# Patient Record
Sex: Female | Born: 1990 | Race: Black or African American | Hispanic: No | Marital: Single | Smoking: Former smoker
Health system: Southern US, Community
[De-identification: ages and names within clinical notes are randomized; demographics above are authoritative.]

## PROBLEM LIST (undated history)

## (undated) ENCOUNTER — Inpatient Hospital Stay (HOSPITAL_COMMUNITY): Payer: Self-pay

## (undated) DIAGNOSIS — D649 Anemia, unspecified: Secondary | ICD-10-CM

## (undated) HISTORY — PX: OTHER SURGICAL HISTORY: SHX169

---
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
## 2011-02-11 NOTE — L&D Delivery Note (Signed)
Delivery Note At 2:04 AM a viable female was delivered via  (Presentation: ;  ).  APGAR: , ; weight .   Placenta status: , .  Cord: 3 vessels with the following complications: .  Cord pH: not done  Anesthesia: Epidural  Episiotomy:  Lacerations:  Suture Repair: 2.0 vicryl Est. Blood Loss (mL):   Mom to postpartum.  Baby to NICU.  MARSHALL,BERNARD A 01/08/2012, 2:21 AM

## 2011-09-03 ENCOUNTER — Emergency Department (HOSPITAL_COMMUNITY)
Admission: EM | Admit: 2011-09-03 | Discharge: 2011-09-03 | Discharge status: Against Medical Advice | Payer: Medicaid Other | Attending: Emergency Medicine | Admitting: Emergency Medicine

## 2011-09-03 ENCOUNTER — Encounter (HOSPITAL_COMMUNITY): Payer: Self-pay | Admitting: *Deleted

## 2011-09-03 DIAGNOSIS — Z331 Pregnant state, incidental: Secondary | ICD-10-CM | POA: Insufficient documentation

## 2011-09-03 DIAGNOSIS — R109 Unspecified abdominal pain: Secondary | ICD-10-CM | POA: Insufficient documentation

## 2011-09-03 LAB — URINALYSIS, ROUTINE W REFLEX MICROSCOPIC
Glucose, UA: NEGATIVE mg/dL
pH: 6 (ref 5.0–8.0)

## 2011-09-03 LAB — URINE MICROSCOPIC-ADD ON
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
## 2011-09-03 NOTE — ED Notes (Signed)
Could feel fetus moving under doppler while trying to find FHR
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
## 2011-09-03 NOTE — ED Notes (Signed)
Called pt for reassessment. No answer.  

## 2011-09-03 NOTE — ED Notes (Signed)
No answer x2 

## 2011-09-04 NOTE — ED Provider Notes (Signed)
The patient presented emergency department but when she was called to be evaluated she was no longer in the waiting room.  This patient was not seen or evaluated by an emergency medicine provider.  I personally did not see nor valuate this patient.  The patient left before being seen but after triage    Lyanne Co, MD 09/04/11 [phone]

## 2011-09-20 ENCOUNTER — Encounter (HOSPITAL_COMMUNITY): Payer: Self-pay | Admitting: Emergency Medicine

## 2011-09-20 ENCOUNTER — Emergency Department (HOSPITAL_COMMUNITY): Payer: Medicaid Other

## 2011-09-20 ENCOUNTER — Emergency Department (HOSPITAL_COMMUNITY)
Admission: EM | Admit: 2011-09-20 | Discharge: 2011-09-21 | Disposition: A | Discharge status: Home / Self Care | Payer: Medicaid Other | Attending: Emergency Medicine | Admitting: Emergency Medicine

## 2011-09-20 DIAGNOSIS — R1031 Right lower quadrant pain: Secondary | ICD-10-CM

## 2011-09-20 DIAGNOSIS — R109 Unspecified abdominal pain: Secondary | ICD-10-CM

## 2011-09-20 DIAGNOSIS — O2690 Pregnancy related conditions, unspecified, unspecified trimester: Secondary | ICD-10-CM | POA: Insufficient documentation

## 2011-09-20 LAB — CBC WITH DIFFERENTIAL/PLATELET
Eosinophils Absolute: 0 10*3/uL (ref 0.0–0.7)
Eosinophils Relative: 0 % (ref 0–5)
HCT: 33.9 % — ABNORMAL LOW (ref 36.0–46.0)
Hemoglobin: 11.8 g/dL — ABNORMAL LOW (ref 12.0–15.0)
Lymphocytes Relative: 11 % — ABNORMAL LOW (ref 12–46)
Lymphs Abs: 1.4 10*3/uL (ref 0.7–4.0)
MCH: 30.3 pg (ref 26.0–34.0)
MCV: 87.1 fL (ref 78.0–100.0)
Monocytes Absolute: 1 10*3/uL (ref 0.1–1.0)
Monocytes Relative: 7 % (ref 3–12)
RBC: 3.89 MIL/uL (ref 3.87–5.11)

## 2011-09-20 LAB — POCT I-STAT, CHEM 8
BUN: 8 mg/dL (ref 6–23)
Chloride: 104 mEq/L (ref 96–112)
Creatinine, Ser: 1 mg/dL (ref 0.50–1.10)
Sodium: 136 mEq/L (ref 135–145)
TCO2: 22 mmol/L (ref 0–100)

## 2011-09-20 LAB — URINALYSIS, ROUTINE W REFLEX MICROSCOPIC
Bilirubin Urine: NEGATIVE
Specific Gravity, Urine: 1.013 (ref 1.005–1.030)
Urobilinogen, UA: 0.2 mg/dL (ref 0.0–1.0)

## 2011-09-20 LAB — HEPATIC FUNCTION PANEL
ALT: 35 U/L (ref 0–35)
Alkaline Phosphatase: 79 U/L (ref 39–117)
Indirect Bilirubin: 0.4 mg/dL (ref 0.3–0.9)
Total Bilirubin: 0.5 mg/dL (ref 0.3–1.2)

## 2011-09-20 LAB — URINE MICROSCOPIC-ADD ON

## 2011-09-20 MED ORDER — DEXTROSE 5 % IV SOLN
1.0000 g | Freq: Once | INTRAVENOUS | Status: AC
  Administered 2011-09-20: 1 g via INTRAVENOUS
  Filled 2011-09-20: qty 10

## 2011-09-20 NOTE — ED Provider Notes (Signed)
History     CSN: [id number]  Arrival date & time 09/20/11  1449   First MD Initiated Contact with Patient 09/20/11 2002      Chief Complaint  Patient presents with  . Flank Pain    (Consider location/radiation/quality/duration/timing/severity/associated sxs/prior treatment) Patient is a 21 y.o. female presenting with abdominal pain. The history is provided by the patient.  Abdominal Pain The primary symptoms of the illness include abdominal pain. The primary symptoms of the illness do not include fever, shortness of breath, nausea or vomiting. The current episode started yesterday. The onset of the illness was sudden. Progression since onset: waxing and waning.  The abdominal pain is located in the RUQ. The abdominal pain does not radiate. The severity of the abdominal pain is 6/10. The abdominal pain is relieved by nothing.  The patient states that she believes she is currently pregnant (approximately 20 weeks). Additional symptoms associated with the illness include frequency. Symptoms associated with the illness do not include chills, constipation or urgency.    History reviewed. No pertinent past medical history.  History reviewed. No pertinent past surgical history.  History reviewed. No pertinent family history.  History  Substance Use Topics  . Smoking status: Never Smoker   . Smokeless tobacco: Not on file  . Alcohol Use: No    OB History    Grav Para Term Preterm Abortions TAB SAB Ect Mult Living   1               Review of Systems  Constitutional: Negative for fever and chills.  HENT: Negative for congestion and rhinorrhea.   Respiratory: Negative for shortness of breath.   Cardiovascular: Negative for chest pain and leg swelling.  Gastrointestinal: Positive for abdominal pain. Negative for nausea, vomiting and constipation.  Genitourinary: Positive for frequency. Negative for urgency, decreased urine volume and difficulty urinating.  Skin: Negative for  wound.  Neurological: Negative for headaches.  Psychiatric/Behavioral: Negative for confusion.  All other systems reviewed and are negative.    Allergies  Review of patient's allergies indicates no known allergies.  Home Medications   Current Outpatient Rx  Name Route Sig Dispense Refill  . PRENATAL MULTIVITAMIN CH Oral Take 1 tablet by mouth daily.      BP 117/59  Pulse 76  Temp 98.5 F (36.9 C) (Oral)  Resp 18  SpO2 98%  LMP 04/25/2011  Physical Exam  Nursing note and vitals reviewed. Constitutional: She is oriented to person, place, and time. She appears well-developed and well-nourished. No distress.  HENT:  Head: Normocephalic and atraumatic.  Right Ear: External ear normal.  Left Ear: External ear normal.  Nose: Nose normal.  Mouth/Throat: Oropharynx is clear and moist.  Neck: Neck supple.  Cardiovascular: Normal rate, regular rhythm, normal heart sounds and intact distal pulses.   Pulmonary/Chest: Effort normal and breath sounds normal. No respiratory distress. She has no wheezes. She has no rales.  Abdominal: Soft. She exhibits no distension. There is tenderness in the right upper quadrant. There is no rebound, no guarding and negative Murphy's sign.  Musculoskeletal: She exhibits no edema.  Lymphadenopathy:    She has no cervical adenopathy.  Neurological: She is alert and oriented to person, place, and time.  Skin: Skin is warm and dry. She is not diaphoretic. No pallor.    ED Course  Procedures (including critical care time)  Labs Reviewed  HCG, QUANTITATIVE, PREGNANCY - Abnormal; Notable for the following:    hCG, Beta Chain, Sharene Butters, S 40981 (*)  All other components within normal limits  URINALYSIS, ROUTINE W REFLEX MICROSCOPIC - Abnormal; Notable for the following:    APPearance CLOUDY (*)     Hgb urine dipstick LARGE (*)     Ketones, ur 15 (*)     Protein, ur 100 (*)     Nitrite POSITIVE (*)     Leukocytes, UA LARGE (*)     All other  components within normal limits  POCT PREGNANCY, URINE - Abnormal; Notable for the following:    Preg Test, Ur POSITIVE (*)     All other components within normal limits  POCT I-STAT, CHEM 8 - Abnormal; Notable for the following:    Calcium, Ion 1.27 (*)     All other components within normal limits  URINE MICROSCOPIC-ADD ON - Abnormal; Notable for the following:    Bacteria, UA MANY (*)     All other components within normal limits  CBC WITH DIFFERENTIAL - Abnormal; Notable for the following:    WBC 13.6 (*)     Hemoglobin 11.8 (*)     HCT 33.9 (*)     Neutrophils Relative 82 (*)     Neutro Abs 11.1 (*)     Lymphocytes Relative 11 (*)     All other components within normal limits  HEPATIC FUNCTION PANEL  URINE CULTURE   Mr Abdomen Wo Contrast  09/21/2011  *RADIOLOGY REPORT*  Clinical Data:  [redacted] weeks pregnant.  Acute onset right lower quadrant pain.  MRI ABDOMEN AND PELVIS WITHOUT CONTRAST  Technique:  Multiplanar multisequence MR imaging of the abdomen and pelvis was performed.  No intravenous contrast was administered.  Comparison:   None.  MRI ABDOMEN  Findings:  There is a moderate hydronephrosis of the right renal collecting system and right hydroureter.  There is no obstructing calculus identified.  The left kidney is normal.  There is no free fluid in the upper abdomen.  The gallbladder is normal.  The spleen, and liver, and pancreas are grossly normal.  IMPRESSION: Right hydroureter is likely physiologic and related to gravid uterus.  No obstructing calculus identified.  MRI PELVIS  Findings: Gravid uterus.  The appendix is not identified.  There is no fluid along the right pericolic gutter.  There is a mildly enlarged right ovary and just inferior to the cecal tip and adjacent to the lateral wall of the uterus.  The ovary measures approximately 1.9 x 2.0 by 4.0 cm. There is moderate amount fluid surrounding this ovary.  There is left ovary is slightly smaller measuring 1.8 by 1.2 cm in  coronal dimension.  There is no significant free fluid the pelvis.  IMPRESSION:  1.  No clear evidence of acute appendicitis.  However, appendix not identified. 2.  The right ovary is prominent with a small amount of ovarian fluid at the cecal tip. Recommend abdominal ultrasound for evaluation of the right ovary.  Findings conveyed to Dr. Marguerita Beards on  09/20/2011 at 23 55 hours.  Original Report Authenticated By: Genevive Bi, M.D.     1. Abdominal pain       MDM  RUQ abd pain since yesterday, sharp, crampy. Approx [redacted] weeks pregnant. No vomiting, anorexia or fever. Does have elevated WBC with left shift. Concern for GB vs appendicitis based on location and being pregnant. MRI abdomen unable to visualize appendix, but right ovary is prominent with some small fluid. Will get ultrasound to r/o torsion. Given rocephin for UTI. Care transferred to Dr. Dierdre Highman with ultrasound pending. If  neg and pain improved, will d/c with Abx for UTI and have her return tomorrow for abd recheck.        Pricilla Loveless, MD 09/21/11 [phone]

## 2011-09-20 NOTE — ED Notes (Signed)
Pt c/o right sided flank pain x several days; pt sts [redacted] weeks pregnant with no prenatal care; pt sts G1; pt denies vaginal bleeding of leaking of fluid; pt sts LMP was March 15th; pt denies N/V

## 2011-09-20 NOTE — ED Provider Notes (Signed)
I saw and evaluated the patient, reviewed the resident's note and I agree with the findings and plan.  She is approximately [redacted] weeks pregnant by dates.  She has not seen an obstetrician.  She complains of right-sided abdominal pain.  She denies anorexia.  She has not had nausea, vomiting, fevers, chills, cough, shortness of breath.  She denies diarrhea.  She denies urinary tract symptoms.  She has not had vaginal discharge or bleeding.  On examination.  She is in no distress.  She has tenderness, bilaterally in her lower abdomen below.  The umbilicus.  Her uterus is at the level of the umbilicus.  She has more significant tenderness in the right lower quadrant.  I'm concerned that she may have an appendicitis.  So we will perform an MRI, for evaluation.  Cheri Guppy, MD 09/20/11 2150

## 2011-09-21 ENCOUNTER — Emergency Department (HOSPITAL_COMMUNITY): Payer: Medicaid Other

## 2011-09-21 ENCOUNTER — Other Ambulatory Visit (HOSPITAL_COMMUNITY): Payer: Self-pay | Admitting: Emergency Medicine

## 2011-09-21 DIAGNOSIS — R1031 Right lower quadrant pain: Secondary | ICD-10-CM

## 2011-09-21 MED ORDER — ONDANSETRON HCL 4 MG PO TABS: 4.0000 mg | ORAL_TABLET | Freq: Four times a day (QID) | ORAL | Status: AC

## 2011-09-21 MED ORDER — MORPHINE SULFATE 4 MG/ML IJ SOLN
4.0000 mg | Freq: Once | INTRAMUSCULAR | Status: AC
  Administered 2011-09-21: 4 mg via INTRAVENOUS
  Filled 2011-09-21: qty 1

## 2011-09-21 MED ORDER — ONDANSETRON HCL 4 MG/2ML IJ SOLN
4.0000 mg | Freq: Once | INTRAMUSCULAR | Status: AC
  Administered 2011-09-21: 4 mg via INTRAVENOUS
  Filled 2011-09-21 (×2): qty 2

## 2011-09-21 NOTE — ED Notes (Signed)
Patient being transported to US

## 2011-09-21 NOTE — ED Provider Notes (Signed)
Results for orders placed during the hospital encounter of 09/20/11  HCG, QUANTITATIVE, PREGNANCY      Component Value Range   hCG, Beta Chain, Quant, S 11614 (*) <5 mIU/mL  URINALYSIS, ROUTINE W REFLEX MICROSCOPIC      Component Value Range   Color, Urine YELLOW  YELLOW   APPearance CLOUDY (*) CLEAR   Specific Gravity, Urine 1.013  1.005 - 1.030   pH 7.5  5.0 - 8.0   Glucose, UA NEGATIVE  NEGATIVE mg/dL   Hgb urine dipstick LARGE (*) NEGATIVE   Bilirubin Urine NEGATIVE  NEGATIVE   Ketones, ur 15 (*) NEGATIVE mg/dL   Protein, ur 161 (*) NEGATIVE mg/dL   Urobilinogen, UA 0.2  0.0 - 1.0 mg/dL   Nitrite POSITIVE (*) NEGATIVE   Leukocytes, UA LARGE (*) NEGATIVE  POCT PREGNANCY, URINE      Component Value Range   Preg Test, Ur POSITIVE (*) NEGATIVE  POCT I-STAT, CHEM 8      Component Value Range   Sodium 136  135 - 145 mEq/L   Potassium 3.9  3.5 - 5.1 mEq/L   Chloride 104  96 - 112 mEq/L   BUN 8  6 - 23 mg/dL   Creatinine, Ser 0.96  0.50 - 1.10 mg/dL   Glucose, Bld 77  70 - 99 mg/dL   Calcium, Ion 0.45 (*) 1.12 - 1.23 mmol/L   TCO2 22  0 - 100 mmol/L   Hemoglobin 12.2  12.0 - 15.0 g/dL   HCT 40.9  81.1 - 91.4 %  URINE MICROSCOPIC-ADD ON      Component Value Range   Squamous Epithelial / LPF RARE  RARE   WBC, UA 21-50  <3 WBC/hpf   RBC / HPF 7-10  <3 RBC/hpf   Bacteria, UA MANY (*) RARE  CBC WITH DIFFERENTIAL      Component Value Range   WBC 13.6 (*) 4.0 - 10.5 K/uL   RBC 3.89  3.87 - 5.11 MIL/uL   Hemoglobin 11.8 (*) 12.0 - 15.0 g/dL   HCT 78.2 (*) 95.6 - 21.3 %   MCV 87.1  78.0 - 100.0 fL   MCH 30.3  26.0 - 34.0 pg   MCHC 34.8  30.0 - 36.0 g/dL   RDW 08.6  57.8 - 46.9 %   Platelets 220  150 - 400 K/uL   Neutrophils Relative 82 (*) 43 - 77 %   Neutro Abs 11.1 (*) 1.7 - 7.7 K/uL   Lymphocytes Relative 11 (*) 12 - 46 %   Lymphs Abs 1.4  0.7 - 4.0 K/uL   Monocytes Relative 7  3 - 12 %   Monocytes Absolute 1.0  0.1 - 1.0 K/uL   Eosinophils Relative 0  0 - 5 %   Eosinophils Absolute 0.0  0.0 - 0.7 K/uL   Basophils Relative 0  0 - 1 %   Basophils Absolute 0.0  0.0 - 0.1 K/uL  HEPATIC FUNCTION PANEL      Component Value Range   Total Protein 7.7  6.0 - 8.3 g/dL   Albumin 3.6  3.5 - 5.2 g/dL   AST 33  0 - 37 U/L   ALT 35  0 - 35 U/L   Alkaline Phosphatase 79  39 - 117 U/L   Total Bilirubin 0.5  0.3 - 1.2 mg/dL   Bilirubin, Direct 0.1  0.0 - 0.3 mg/dL   Indirect Bilirubin 0.4  0.3 - 0.9 mg/dL   Mr Abdomen Wo Contrast  09/21/2011  *RADIOLOGY REPORT*  Clinical Data:  [redacted] weeks pregnant.  Acute onset right lower quadrant pain.  MRI ABDOMEN AND PELVIS WITHOUT CONTRAST  Technique:  Multiplanar multisequence MR imaging of the abdomen and pelvis was performed.  No intravenous contrast was administered.  Comparison:   None.  MRI ABDOMEN  Findings:  There is a moderate hydronephrosis of the right renal collecting system and right hydroureter.  There is no obstructing calculus identified.  The left kidney is normal.  There is no free fluid in the upper abdomen.  The gallbladder is normal.  The spleen, and liver, and pancreas are grossly normal.  IMPRESSION: Right hydroureter is likely physiologic and related to gravid uterus.  No obstructing calculus identified.  MRI PELVIS  Findings: Gravid uterus.  The appendix is not identified.  There is no fluid along the right pericolic gutter.  There is a mildly enlarged right ovary and just inferior to the cecal tip and adjacent to the lateral wall of the uterus.  The ovary measures approximately 1.9 x 2.0 by 4.0 cm. There is moderate amount fluid surrounding this ovary.  There is left ovary is slightly smaller measuring 1.8 by 1.2 cm in coronal dimension.  There is no significant free fluid the pelvis.  IMPRESSION:  1.  No clear evidence of acute appendicitis.  However, appendix not identified. 2.  The right ovary is prominent with a small amount of ovarian fluid at the cecal tip. Recommend abdominal ultrasound for evaluation of  the right ovary.  Findings conveyed to Dr. Marguerita Beards on  09/20/2011 at 23 55 hours.  Original Report Authenticated By: Genevive Bi, M.D.   US Ob Limited  09/21/2011  *RADIOLOGY REPORT*  Clinical Data: Pregnant, right lower quadrant pain.  LIMITED OBSTETRIC ULTRASOUND  Technique:  Transabdominal and transvaginal sonographic images.  Findings:  Number of Fetuses: 1 Heart Rate: 144 bpm Movement: Identified Presentation: Cephalic Placental Location: Anterior Previa: Not identified Amniotic Fluid (Subjective): Within normal limits  BPD: 4.7cm   20w   2d   EDC: 02/06/2012  MATERNAL FINDINGS: Cervix: Closed Uterus/Adnexae: Right ovary measures 2.8 x 2.4 x 2.2 cm, within normal sonographic appearance.  The left ovary not identified.  IMPRESSION: Intrauterine gestation with cardiac activity and movement documented.  Estimated age of 20 weeks 2 days by BPD.  Normal sonographic appearance to the right ovary.  The left ovary is not visualized.  Recommend followup with non-emergent complete OB 14+ wk US examination for fetal biometric evaluation and anatomic survey if not already performed.  Original Report Authenticated By: Waneta Martins, M.D.   US Ob Transvaginal  09/21/2011  *RADIOLOGY REPORT*  Clinical Data: Pregnant, right lower quadrant pain.  LIMITED OBSTETRIC ULTRASOUND  Technique:  Transabdominal and transvaginal sonographic images.  Findings:  Number of Fetuses: 1 Heart Rate: 144 bpm Movement: Identified Presentation: Cephalic Placental Location: Anterior Previa: Not identified Amniotic Fluid (Subjective): Within normal limits  BPD: 4.7cm   20w   2d   EDC: 02/06/2012  MATERNAL FINDINGS: Cervix: Closed Uterus/Adnexae: Right ovary measures 2.8 x 2.4 x 2.2 cm, within normal sonographic appearance.  The left ovary not identified.  IMPRESSION: Intrauterine gestation with cardiac activity and movement documented.  Estimated age of 20 weeks 2 days by BPD.  Normal sonographic appearance to the right ovary.  The  left ovary is not visualized.  Recommend followup with non-emergent complete OB 14+ wk US examination for fetal biometric evaluation and anatomic survey if not already performed.  Original Report Authenticated By:  Waneta Martins, M.D.    Tissue care assumed from Dr. Kem Boroughs - pending ultrasound to evaluate ovaries. Having right-sided abdominal pain is [redacted] weeks pregnant. MRI was completed and is not show acute appendicitis.  Reviewed the ultrasound as above and evaluated the patient at 6 AM. She is eating [employer] french fries and states she has very mild pain. On exam his gravid with right lower quadrant soft and nontender and has no significant tenderness on exam otherwise. Patient is comfortable for discharge home. She plans to followup with OB/GYN Dr. Gaynell Face is not established yet. She agrees to recheck in 24 hours either here or with her OB/GYN. Patient sent home with prescription for Zofran and agrees to take Tylenol as needed for discomfort.  Sunnie Nielsen, MD 09/21/11 [phone]

## 2011-09-21 NOTE — ED Notes (Signed)
Pt states understanding of discharge instructions 

## 2011-09-22 NOTE — ED Provider Notes (Signed)
I saw and evaluated the patient, reviewed the resident's note and I agree with the findings and plan.  Jacorie Ernsberger, MD 09/22/11 0705 

## 2011-09-23 LAB — URINE CULTURE

## 2011-09-25 NOTE — ED Notes (Signed)
I have been unable to reach this patient by phone.  A letter is being sent.

## 2011-09-25 NOTE — ED Notes (Signed)
Chart returned from EDP office . Prescribe  Cipro  500 mg po BID x 7 days for UTI need to be called to pharmacy per  Fayrene Helper PAC.

## 2011-11-25 LAB — OB RESULTS CONSOLE GC/CHLAMYDIA
Chlamydia: POSITIVE
Gonorrhea: NEGATIVE

## 2011-11-25 LAB — OB RESULTS CONSOLE RPR: RPR: NONREACTIVE

## 2011-11-25 LAB — OB RESULTS CONSOLE RUBELLA ANTIBODY, IGM: Rubella: IMMUNE

## 2011-11-25 LAB — OB RESULTS CONSOLE ANTIBODY SCREEN: Antibody Screen: NEGATIVE

## 2011-12-01 ENCOUNTER — Other Ambulatory Visit (HOSPITAL_COMMUNITY): Payer: Self-pay | Admitting: Obstetrics

## 2011-12-01 DIAGNOSIS — Z3689 Encounter for other specified antenatal screening: Secondary | ICD-10-CM

## 2011-12-03 ENCOUNTER — Ambulatory Visit (HOSPITAL_COMMUNITY)
Admission: RE | Admit: 2011-12-03 | Discharge: 2011-12-03 | Disposition: A | Discharge status: Home / Self Care | Payer: Medicaid Other | Source: Ambulatory Visit | Attending: Obstetrics | Admitting: Obstetrics

## 2011-12-03 DIAGNOSIS — O0930 Supervision of pregnancy with insufficient antenatal care, unspecified trimester: Secondary | ICD-10-CM | POA: Insufficient documentation

## 2011-12-03 DIAGNOSIS — Z3689 Encounter for other specified antenatal screening: Secondary | ICD-10-CM

## 2011-12-31 ENCOUNTER — Encounter (HOSPITAL_COMMUNITY): Payer: Self-pay

## 2011-12-31 ENCOUNTER — Emergency Department (INDEPENDENT_AMBULATORY_CARE_PROVIDER_SITE_OTHER)
Admission: EM | Admit: 2011-12-31 | Discharge: 2011-12-31 | Disposition: A | Discharge status: Home / Self Care | Payer: Medicaid Other | Source: Home / Self Care | Attending: Family Medicine | Admitting: Family Medicine

## 2011-12-31 ENCOUNTER — Encounter (HOSPITAL_COMMUNITY): Payer: Self-pay | Admitting: *Deleted

## 2011-12-31 ENCOUNTER — Inpatient Hospital Stay (HOSPITAL_COMMUNITY)
Admission: AD | Admit: 2011-12-31 | Discharge: 2012-01-10 | DRG: 775 | Disposition: A | Discharge status: Home / Self Care | Payer: Medicaid Other | Source: Ambulatory Visit | Attending: Obstetrics | Admitting: Obstetrics

## 2011-12-31 DIAGNOSIS — O4290 Premature rupture of membranes, unspecified as to length of time between rupture and onset of labor, unspecified weeks of gestation: Principal | ICD-10-CM | POA: Diagnosis present

## 2011-12-31 DIAGNOSIS — O42919 Preterm premature rupture of membranes, unspecified as to length of time between rupture and onset of labor, unspecified trimester: Secondary | ICD-10-CM

## 2011-12-31 DIAGNOSIS — B86 Scabies: Secondary | ICD-10-CM

## 2011-12-31 LAB — CBC
HCT: 34 % — ABNORMAL LOW (ref 36.0–46.0)
Hemoglobin: 11.8 g/dL — ABNORMAL LOW (ref 12.0–15.0)
MCHC: 34.7 g/dL (ref 30.0–36.0)
RBC: 3.92 MIL/uL (ref 3.87–5.11)

## 2011-12-31 LAB — TYPE AND SCREEN: ABO/RH(D): O POS

## 2011-12-31 LAB — AMNISURE RUPTURE OF MEMBRANE (ROM) NOT AT ARMC: Amnisure ROM: POSITIVE

## 2011-12-31 MED ORDER — NIFEDIPINE 10 MG PO CAPS
20.0000 mg | ORAL_CAPSULE | Freq: Once | ORAL | Status: AC
  Administered 2011-12-31: 20 mg via ORAL
  Filled 2011-12-31: qty 2

## 2011-12-31 MED ORDER — MAGNESIUM SULFATE 40 G IN LACTATED RINGERS - SIMPLE
2.0000 g/h | INTRAVENOUS | Status: DC
  Administered 2012-01-01: 2 g/h via INTRAVENOUS
  Filled 2011-12-31 (×2): qty 500

## 2011-12-31 MED ORDER — CALCIUM CARBONATE ANTACID 500 MG PO CHEW: 2.0000 | CHEWABLE_TABLET | ORAL | Status: DC | PRN

## 2011-12-31 MED ORDER — ACETAMINOPHEN 325 MG PO TABS: 650.0000 mg | ORAL_TABLET | ORAL | Status: DC | PRN

## 2011-12-31 MED ORDER — LACTATED RINGERS IV BOLUS (SEPSIS)
1000.0000 mL | Freq: Once | INTRAVENOUS | Status: AC
  Administered 2011-12-31: 1000 mL via INTRAVENOUS

## 2011-12-31 MED ORDER — LACTATED RINGERS IV SOLN
INTRAVENOUS | Status: DC
  Administered 2011-12-31 – 2012-01-07 (×12): via INTRAVENOUS

## 2011-12-31 MED ORDER — SODIUM CHLORIDE 0.9 % IV SOLN
250.0000 mg | Freq: Four times a day (QID) | INTRAVENOUS | Status: AC
  Administered 2012-01-01 – 2012-01-02 (×7): 250 mg via INTRAVENOUS
  Filled 2011-12-31 (×10): qty 250

## 2011-12-31 MED ORDER — PERMETHRIN 5 % EX CREA: TOPICAL_CREAM | CUTANEOUS | Status: DC

## 2011-12-31 MED ORDER — AMOXICILLIN 500 MG PO CAPS
500.0000 mg | ORAL_CAPSULE | Freq: Three times a day (TID) | ORAL | Status: AC
  Administered 2012-01-02 – 2012-01-07 (×15): 500 mg via ORAL
  Filled 2011-12-31 (×15): qty 1

## 2011-12-31 MED ORDER — MAGNESIUM SULFATE BOLUS VIA INFUSION
4.0000 g | Freq: Once | INTRAVENOUS | Status: AC
  Administered 2011-12-31: 4 g via INTRAVENOUS
  Filled 2011-12-31: qty 500

## 2011-12-31 MED ORDER — SODIUM CHLORIDE 0.9 % IV SOLN
2.0000 g | Freq: Four times a day (QID) | INTRAVENOUS | Status: AC
  Administered 2011-12-31 – 2012-01-02 (×8): 2 g via INTRAVENOUS
  Filled 2011-12-31 (×9): qty 2000

## 2011-12-31 MED ORDER — BETAMETHASONE SOD PHOS & ACET 6 (3-3) MG/ML IJ SUSP
12.0000 mg | INTRAMUSCULAR | Status: AC
  Administered 2011-12-31 – 2012-01-01 (×2): 12 mg via INTRAMUSCULAR
  Filled 2011-12-31 (×2): qty 2

## 2011-12-31 MED ORDER — BUTORPHANOL TARTRATE 1 MG/ML IJ SOLN: 1.0000 mg | INTRAMUSCULAR | Status: DC | PRN

## 2011-12-31 MED ORDER — ERYTHROMYCIN LACTOBIONATE 500 MG IV SOLR
500.0000 mg | Freq: Once | INTRAVENOUS | Status: AC
  Administered 2011-12-31: 500 mg via INTRAVENOUS
  Filled 2011-12-31: qty 500

## 2011-12-31 MED ORDER — PRENATAL MULTIVITAMIN CH
1.0000 | ORAL_TABLET | Freq: Every day | ORAL | Status: DC
  Administered 2012-01-01 – 2012-01-07 (×7): 1 via ORAL
  Filled 2011-12-31 (×6): qty 1

## 2011-12-31 NOTE — Progress Notes (Signed)
Pt states she fell about 3 wks ago

## 2011-12-31 NOTE — MAU Note (Signed)
Pt states she feels like her water broke.. About ago.

## 2011-12-31 NOTE — Plan of Care (Signed)
Problem: Consults Goal: Birthing Suites Patient Information Press F2 to bring up selections list Outcome: Completed/Met Date Met:  12/31/11  Pt < [redacted] weeks EGA

## 2011-12-31 NOTE — MAU Provider Note (Signed)
History     CSN: [id number]  Arrival date and time: 12/31/11 2023   First Provider Initiated Contact with Patient 12/31/11 2058      Chief Complaint  Patient presents with  . Rupture of Membranes   HPI  Cindy Armstrong is a 21 y.o. G1P0 who was at urgent care having her skin rash evaluated. Upon leaving she felt like she was leaking fluid. She thought it was just discharge, but when she looked it was clear and watery and she has continued to leak. She thinks that she may have been having contractions last night. However, upon arriving here she started noticing contractions.   Past Medical History  Diagnosis Date  . No pertinent past medical history     Past Surgical History  Procedure Date  . No past surgeries     Family History  Problem Relation Age of Onset  . Alcohol abuse Neg Hx   . Arthritis Neg Hx   . Asthma Neg Hx   . Birth defects Neg Hx   . Cancer Neg Hx   . COPD Neg Hx   . Depression Neg Hx   . Diabetes Neg Hx   . Drug abuse Neg Hx   . Early death Neg Hx   . Hearing loss Neg Hx   . Heart disease Neg Hx   . Hyperlipidemia Neg Hx   . Hypertension Neg Hx   . Kidney disease Neg Hx   . Learning disabilities Neg Hx   . Mental illness Neg Hx   . Mental retardation Neg Hx   . Miscarriages / Stillbirths Neg Hx   . Stroke Neg Hx   . Vision loss Neg Hx     History  Substance Use Topics  . Smoking status: Never Smoker   . Smokeless tobacco: Not on file  . Alcohol Use: No    Allergies: No Known Allergies  Prescriptions prior to admission  Medication Sig Dispense Refill  . permethrin (ELIMITE) 5 % cream Use as directed on bottle and repeat in 1 week.  60 g  1  . Prenatal Vit-Fe Fumarate-FA (PRENATAL MULTIVITAMIN) TABS Take 1 tablet by mouth daily.        Review of Systems  Constitutional: Negative for fever.  Eyes: Negative for blurred vision.  Gastrointestinal: Negative for nausea, vomiting, diarrhea and constipation.  Genitourinary: Negative  for dysuria, urgency and frequency.  Neurological: Negative for headaches.   Physical Exam   Blood pressure 125/67, pulse 88, temperature 97.4 F (36.3 C), temperature source Oral, resp. rate 18, height 5\' 3"  (1.6 m), weight 58.968 kg (130 lb), last menstrual period 04/25/2011.  Physical Exam  Nursing note and vitals reviewed. Constitutional: She is oriented to person, place, and time. She appears well-developed and well-nourished.  Cardiovascular: Normal rate.   Respiratory: Effort normal.  GI: Soft.  Neurological: She is alert and oriented to person, place, and time.  Skin: Skin is warm and dry.    MAU Course  Procedures  Results for orders placed during the hospital encounter of 12/31/11 (from the past 24 hour(s))  AMNISURE RUPTURE OF MEMBRANE (ROM)     Status: Normal   Collection Time   12/31/11  9:15 PM      Component Value Range   Amnisure ROM POSITIVE     2130: spoke with Dr. Gaynell Face. Orders obtained for admission  Assessment and Plan   1. Preterm premature rupture of membranes (PPROM) delivered, current hospitalization   Admit to labor and delivery  Magnesium BMZ Antibiotics Stadol PRN  Dr. Gaynell Face to assume care.   Tawnya Crook 12/31/2011, 8:59 PM
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
## 2011-12-31 NOTE — ED Notes (Signed)
Reports a rash for "some time"; nothing she does helps, itches real bad; PREGNANT

## 2012-01-01 ENCOUNTER — Encounter (HOSPITAL_COMMUNITY): Payer: Self-pay | Admitting: *Deleted

## 2012-01-01 LAB — RPR: RPR Ser Ql: NONREACTIVE

## 2012-01-01 MED ORDER — PERMETHRIN 5 % EX CREA
TOPICAL_CREAM | Freq: Every day | CUTANEOUS | Status: DC
  Administered 2012-01-01: 12:00:00 via TOPICAL
  Administered 2012-01-02: 1 via TOPICAL
  Administered 2012-01-03 – 2012-01-04 (×2): via TOPICAL
  Filled 2012-01-01 (×2): qty 60

## 2012-01-01 NOTE — H&P (Signed)
This is Dr. Francoise Ceo dictating the history and physical on  Cindy Armstrong she's a 21 year old gravida 1 at 49 weeks and 5 days her EDC is 02/14/2012 her membranes ruptured spontaneously last night at 9 PM fluid clear vertex presentation and she was contracting pelvic was not done and she has stopped contracting on magnesium sulfate 4 g loading and 2 g per hour she was also started on IV ampicillin and erythromycin is a ruptured membranes and she's received one dose of betamethasone 12.5 mg IM she is on continuous monitoring her GBS is unknown Past medical history history of scabies affecting both upper extremities and she was treated with promethazine cream at bedtim  surgical history negative Social history noncontributory System review negative Physical exam revealed a well-developed female in no distress with ruptured Mr. Membranes Breasts negative Lungs clear to P&A Heart regular rhythm no murmurs no gallops Abdomen 34 weeks size Pelvic deferred Extremities negative

## 2012-01-01 NOTE — Progress Notes (Signed)
Patient transferred to room 149, antenatal unit

## 2012-01-01 NOTE — Progress Notes (Signed)
Monitors off for skin treatment and transfer to room 149

## 2012-01-02 ENCOUNTER — Inpatient Hospital Stay (HOSPITAL_COMMUNITY): Payer: Medicaid Other

## 2012-01-02 MED ORDER — ERYTHROMYCIN BASE 250 MG PO TBEC
250.0000 mg | DELAYED_RELEASE_TABLET | Freq: Four times a day (QID) | ORAL | Status: AC
  Administered 2012-01-03 – 2012-01-07 (×20): 250 mg via ORAL
  Filled 2012-01-02 (×20): qty 1

## 2012-01-02 NOTE — Progress Notes (Signed)
Patient ID: Cindy Armstrong, female   DOB: 1990-04-09, 21 y.o.   MRN: [id number] Vital signs normal Use 1 pad in the past 24 hours Has gotten her betamethasone and is on her IV antibiotics Will get an ultrasound today

## 2012-01-03 NOTE — Progress Notes (Signed)
Patient ID: Cindy Armstrong, female   DOB: [birth year], 21 y.o.   MRN: [id number] Vital signs normal She has some occasional contractions which she does not feel ultrasound yesterday confirmed ruptured membranes AFI was less than 5 she's now off of her IV antibiotics and is on by mouth medication she got her betamethasone x2 so she is is being observed for labor

## 2012-01-04 NOTE — Progress Notes (Signed)
Patient ID: Cindy Armstrong, female   DOB: 1990-03-27, 21 y.o.   MRN: [id number] Vital signs normal Still leaking Started contracting yesterday 3-8 minutes however it is sometimes present and other times absent reactive tracing observe and

## 2012-01-05 NOTE — Progress Notes (Signed)
Ur chart review completed.  

## 2012-01-05 NOTE — Progress Notes (Signed)
Patient ID: Cindy Armstrong, female   DOB: [birth year], 21 y.o.   MRN: [id number] Vital signs normal Contractions. Still leaking Tracing reactive

## 2012-01-06 NOTE — Progress Notes (Signed)
Patient ID: Cindy Armstrong, female   DOB: Jun 06, 1990, 21 y.o.   MRN: [id number] Vital signs normal Still uses 1 pad a day Tracing reactive on daily NST not no contractions

## 2012-01-07 ENCOUNTER — Encounter (HOSPITAL_COMMUNITY): Payer: Self-pay | Admitting: Obstetrics

## 2012-01-07 ENCOUNTER — Encounter (HOSPITAL_COMMUNITY): Payer: Self-pay | Admitting: Anesthesiology

## 2012-01-07 ENCOUNTER — Inpatient Hospital Stay (HOSPITAL_COMMUNITY): Payer: Medicaid Other | Admitting: Anesthesiology

## 2012-01-07 LAB — CBC
MCH: 29.9 pg (ref 26.0–34.0)
MCHC: 34.6 g/dL (ref 30.0–36.0)
RDW: 12.9 % (ref 11.5–15.5)

## 2012-01-07 MED ORDER — OXYTOCIN BOLUS FROM INFUSION
500.0000 mL | INTRAVENOUS | Status: DC
  Administered 2012-01-08: 500 mL via INTRAVENOUS

## 2012-01-07 MED ORDER — FENTANYL 2.5 MCG/ML BUPIVACAINE 1/10 % EPIDURAL INFUSION (WH - ANES)
14.0000 mL/h | INTRAMUSCULAR | Status: DC
  Administered 2012-01-07: 14 mL/h via EPIDURAL
  Filled 2012-01-07: qty 125

## 2012-01-07 MED ORDER — SODIUM CHLORIDE 0.9 % IJ SOLN
3.0000 mL | Freq: Two times a day (BID) | INTRAMUSCULAR | Status: DC
  Administered 2012-01-07 (×2): 3 mL via INTRAVENOUS

## 2012-01-07 MED ORDER — LACTATED RINGERS IV SOLN
INTRAVENOUS | Status: DC
  Administered 2012-01-07: via INTRAVENOUS

## 2012-01-07 MED ORDER — PHENYLEPHRINE 40 MCG/ML (10ML) SYRINGE FOR IV PUSH (FOR BLOOD PRESSURE SUPPORT)
80.0000 ug | PREFILLED_SYRINGE | INTRAVENOUS | Status: DC | PRN
  Filled 2012-01-07: qty 2

## 2012-01-07 MED ORDER — DIPHENHYDRAMINE HCL 50 MG/ML IJ SOLN
12.5000 mg | INTRAMUSCULAR | Status: DC | PRN
Start: 2012-01-07 — End: 2012-01-10

## 2012-01-07 MED ORDER — FLEET ENEMA 7-19 GM/118ML RE ENEM: 1.0000 | ENEMA | Freq: Every day | RECTAL | Status: DC | PRN

## 2012-01-07 MED ORDER — EPHEDRINE 5 MG/ML INJ
10.0000 mg | INTRAVENOUS | Status: DC | PRN
  Filled 2012-01-07: qty 4
  Filled 2012-01-07: qty 2

## 2012-01-07 MED ORDER — LACTATED RINGERS IV SOLN
500.0000 mL | Freq: Once | INTRAVENOUS | Status: AC
  Administered 2012-01-07: 500 mL via INTRAVENOUS

## 2012-01-07 MED ORDER — LACTATED RINGERS IV SOLN: 500.0000 mL | INTRAVENOUS | Status: DC | PRN

## 2012-01-07 MED ORDER — OXYCODONE-ACETAMINOPHEN 5-325 MG PO TABS
1.0000 | ORAL_TABLET | ORAL | Status: DC | PRN
  Administered 2012-01-08: 1 via ORAL
  Filled 2012-01-07: qty 1

## 2012-01-07 MED ORDER — PHENYLEPHRINE 40 MCG/ML (10ML) SYRINGE FOR IV PUSH (FOR BLOOD PRESSURE SUPPORT)
80.0000 ug | PREFILLED_SYRINGE | INTRAVENOUS | Status: DC | PRN
  Filled 2012-01-07: qty 5
  Filled 2012-01-07: qty 2

## 2012-01-07 MED ORDER — LIDOCAINE HCL (PF) 1 % IJ SOLN
INTRAMUSCULAR | Status: DC | PRN
  Administered 2012-01-07 (×2): 5 mL

## 2012-01-07 MED ORDER — LIDOCAINE HCL (PF) 1 % IJ SOLN
30.0000 mL | INTRAMUSCULAR | Status: DC | PRN
  Administered 2012-01-08: 30 mL via SUBCUTANEOUS
  Filled 2012-01-07: qty 30

## 2012-01-07 MED ORDER — ACETAMINOPHEN 325 MG PO TABS: 650.0000 mg | ORAL_TABLET | ORAL | Status: DC | PRN

## 2012-01-07 MED ORDER — IBUPROFEN 600 MG PO TABS
600.0000 mg | ORAL_TABLET | Freq: Four times a day (QID) | ORAL | Status: DC | PRN
  Filled 2012-01-07 (×5): qty 1

## 2012-01-07 MED ORDER — SODIUM CHLORIDE 0.9 % IJ SOLN: 3.0000 mL | INTRAMUSCULAR | Status: DC | PRN

## 2012-01-07 MED ORDER — ONDANSETRON HCL 4 MG/2ML IJ SOLN: 4.0000 mg | Freq: Four times a day (QID) | INTRAMUSCULAR | Status: DC | PRN

## 2012-01-07 MED ORDER — OXYTOCIN 40 UNITS IN LACTATED RINGERS INFUSION - SIMPLE MED
62.5000 mL/h | INTRAVENOUS | Status: DC
  Filled 2012-01-07: qty 1000

## 2012-01-07 MED ORDER — CITRIC ACID-SODIUM CITRATE 334-500 MG/5ML PO SOLN: 30.0000 mL | ORAL | Status: DC | PRN

## 2012-01-07 MED ORDER — EPHEDRINE 5 MG/ML INJ
10.0000 mg | INTRAVENOUS | Status: DC | PRN
  Filled 2012-01-07: qty 2

## 2012-01-07 NOTE — Progress Notes (Signed)
Patient ID: Cindy Armstrong, female   DOB: 1990-05-23, 21 y.o.   MRN: [id number] Vital signs normal Doing much more leakage and she was last week Contractions have stopped No complaints

## 2012-01-07 NOTE — Anesthesia Procedure Notes (Signed)
Epidural Patient location during procedure: OB Start time: 01/07/2012 11:12 PM  Staffing Anesthesiologist: Brayton Caves R Performed by: anesthesiologist   Preanesthetic Checklist Completed: patient identified, site marked, surgical consent, pre-op evaluation, timeout performed, IV checked, risks and benefits discussed and monitors and equipment checked  Epidural Patient position: sitting Prep: site prepped and draped and DuraPrep Patient monitoring: continuous pulse ox and blood pressure Approach: midline Injection technique: LOR air and LOR saline  Needle:  Needle type: Tuohy  Needle gauge: 17 G Needle length: 9 cm and 9 Needle insertion depth: 4 cm Catheter type: closed end flexible Catheter size: 19 Gauge Catheter at skin depth: 10 cm Test dose: negative  Assessment Events: blood not aspirated, injection not painful, no injection resistance, negative IV test and no paresthesia  Additional Notes Patient identified.  Risk benefits discussed including failed block, incomplete pain control, headache, nerve damage, paralysis, blood pressure changes, nausea, vomiting, reactions to medication both toxic or allergic, and postpartum back pain.  Patient expressed understanding and wished to proceed.  All questions were answered.  Sterile technique used throughout procedure and epidural site dressed with sterile barrier dressing. No paresthesia or other complications noted.The patient did not experience any signs of intravascular injection such as tinnitus or metallic taste in mouth nor signs of intrathecal spread such as rapid motor block. Please see nursing notes for vital signs.

## 2012-01-07 NOTE — Anesthesia Preprocedure Evaluation (Signed)

## 2012-01-08 ENCOUNTER — Encounter (HOSPITAL_COMMUNITY): Payer: Self-pay | Admitting: Obstetrics

## 2012-01-08 MED ORDER — LANOLIN HYDROUS EX OINT: TOPICAL_OINTMENT | CUTANEOUS | Status: DC | PRN

## 2012-01-08 MED ORDER — SENNOSIDES-DOCUSATE SODIUM 8.6-50 MG PO TABS
2.0000 | ORAL_TABLET | Freq: Every day | ORAL | Status: DC
  Administered 2012-01-08 – 2012-01-09 (×2): 2 via ORAL

## 2012-01-08 MED ORDER — DIPHENHYDRAMINE HCL 25 MG PO CAPS: 25.0000 mg | ORAL_CAPSULE | Freq: Four times a day (QID) | ORAL | Status: DC | PRN

## 2012-01-08 MED ORDER — ZOLPIDEM TARTRATE 5 MG PO TABS: 5.0000 mg | ORAL_TABLET | Freq: Every evening | ORAL | Status: DC | PRN

## 2012-01-08 MED ORDER — PRENATAL MULTIVITAMIN CH
1.0000 | ORAL_TABLET | Freq: Every day | ORAL | Status: DC
  Administered 2012-01-08 – 2012-01-09 (×2): 1 via ORAL
  Filled 2012-01-08 (×2): qty 1

## 2012-01-08 MED ORDER — BENZOCAINE-MENTHOL 20-0.5 % EX AERO
1.0000 "application " | INHALATION_SPRAY | CUTANEOUS | Status: DC | PRN
  Filled 2012-01-08: qty 56

## 2012-01-08 MED ORDER — TETANUS-DIPHTH-ACELL PERTUSSIS 5-2.5-18.5 LF-MCG/0.5 IM SUSP
0.5000 mL | Freq: Once | INTRAMUSCULAR | Status: AC
  Administered 2012-01-09: 0.5 mL via INTRAMUSCULAR
  Filled 2012-01-08: qty 0.5

## 2012-01-08 MED ORDER — ONDANSETRON HCL 4 MG/2ML IJ SOLN: 4.0000 mg | INTRAMUSCULAR | Status: DC | PRN

## 2012-01-08 MED ORDER — ONDANSETRON HCL 4 MG PO TABS: 4.0000 mg | ORAL_TABLET | ORAL | Status: DC | PRN

## 2012-01-08 MED ORDER — OXYCODONE-ACETAMINOPHEN 5-325 MG PO TABS
1.0000 | ORAL_TABLET | ORAL | Status: DC | PRN
  Administered 2012-01-09 (×2): 1 via ORAL
  Filled 2012-01-08 (×2): qty 1

## 2012-01-08 MED ORDER — IBUPROFEN 600 MG PO TABS
600.0000 mg | ORAL_TABLET | Freq: Four times a day (QID) | ORAL | Status: DC
  Administered 2012-01-08 – 2012-01-09 (×6): 600 mg via ORAL
  Filled 2012-01-08: qty 1

## 2012-01-08 MED ORDER — FERROUS SULFATE 325 (65 FE) MG PO TABS
325.0000 mg | ORAL_TABLET | Freq: Two times a day (BID) | ORAL | Status: DC
  Administered 2012-01-08 – 2012-01-09 (×3): 325 mg via ORAL
  Filled 2012-01-08 (×3): qty 1

## 2012-01-08 MED ORDER — SIMETHICONE 80 MG PO CHEW: 80.0000 mg | CHEWABLE_TABLET | ORAL | Status: DC | PRN

## 2012-01-08 MED ORDER — DIBUCAINE 1 % RE OINT: 1.0000 "application " | TOPICAL_OINTMENT | RECTAL | Status: DC | PRN

## 2012-01-08 MED ORDER — LACTATED RINGERS IV SOLN
INTRAVENOUS | Status: DC
  Administered 2012-01-08: via INTRAUTERINE

## 2012-01-08 MED ORDER — WITCH HAZEL-GLYCERIN EX PADS: 1.0000 "application " | MEDICATED_PAD | CUTANEOUS | Status: DC | PRN

## 2012-01-08 NOTE — Progress Notes (Signed)
Patient ID: Cindy Armstrong, female   DOB: 1990-03-09, 21 y.o.   MRN: [id number] Patient [redacted] weeks pregnant with ruptured membranes since November 20 she went into labor tonight cervix is   7-8 cm 100% effaced vertex -1 station she is having some variables from time to time so an IUPC was just inserted and amnioinfusion began 325 cc of Ringer's lactate loading and 125 cc an hour she has received betamethasone times stool and has been on a course of   ampicillin and erythromycin

## 2012-01-08 NOTE — Anesthesia Postprocedure Evaluation (Signed)
  Anesthesia Post-op Note  Patient: Cindy Armstrong  Procedure(s) Performed: * No procedures listed *  Patient Location: PACU and Mother/Baby  Anesthesia Type:Epidural  Level of Consciousness: awake, alert  and patient cooperative  Airway and Oxygen Therapy: Patient Spontanous Breathing  Post-op Pain: none  Post-op Assessment: Post-op Vital signs reviewed, Adequate PO intake, No headache, No backache, No residual numbness and No residual motor weakness  Post-op Vital Signs: Reviewed and stable  Complications: No apparent anesthesia complications

## 2012-01-08 NOTE — Progress Notes (Signed)
Visited with patient while rounding on the unit.  She was in good spirits and was grateful that her baby is doing well.  Although she was not expecting for her baby to be in the NICU and it is emotional for her, she seems to be coping well with the situation.  We will continue to follow up with her when we see her in the NICU, but please page as needs arise.  Cindy Armstrong 12:26 PM   01/08/12 1200  Clinical Encounter Type  Visited With Patient and family together  Visit Type Initial

## 2012-01-09 LAB — CBC
HCT: 32.7 % — ABNORMAL LOW (ref 36.0–46.0)
Hemoglobin: 11.1 g/dL — ABNORMAL LOW (ref 12.0–15.0)
MCV: 86.7 fL (ref 78.0–100.0)
RBC: 3.77 MIL/uL — ABNORMAL LOW (ref 3.87–5.11)
WBC: 21.2 10*3/uL — ABNORMAL HIGH (ref 4.0–10.5)

## 2012-01-09 MED ORDER — IBUPROFEN 600 MG PO TABS
600.0000 mg | ORAL_TABLET | Freq: Four times a day (QID) | ORAL | Status: DC
  Administered 2012-01-09 – 2012-01-10 (×3): 600 mg via ORAL
  Filled 2012-01-09 (×3): qty 1

## 2012-01-09 NOTE — Progress Notes (Signed)
Post Partum Day 1 Subjective: no complaints  Objective: Blood pressure 137/66, pulse 82, temperature 97.9 F (36.6 C), temperature source Oral, resp. rate 18, height 5\' 3"  (1.6 m), weight 130 lb (58.968 kg), last menstrual period 04/25/2011, SpO2 100.00%, unknown if currently breastfeeding.  Physical Exam:  General: alert and no distress Lochia: appropriate Uterine Fundus: firm Incision: none DVT Evaluation: No evidence of DVT seen on physical exam.   Basename 01/09/12 0520 01/07/12 2205  HGB 11.1* 11.6*  HCT 32.7* 33.5*    Assessment/Plan: Plan for discharge tomorrow   LOS: 9 days   Ronna Herskowitz A 01/09/2012, 11:40 AM

## 2012-01-09 NOTE — Clinical Social Work Maternal (Signed)
    Clinical Social Work Department PSYCHOSOCIAL ASSESSMENT - MATERNAL/CHILD 01/09/2012  Patient:  CAMILA, MAITA  Account Number:  [id number]  Admit Date:  12/31/2011  Marjo Bicker Name:   Teresita Madura    Clinical Social Worker:  Andy Gauss   Date/Time:  01/09/2012 11:34 AM  Date Referred:  01/09/2012   Referral source  NICU     Referred reason  NICU  [facility]   Other referral source:    I:  FAMILY / HOME ENVIRONMENT Child's legal guardian:  PARENT  Guardian - Name Guardian - Age Guardian - Address  Marybel Alcott [address] [place], [place] [address]  Loetta Rough 21    Other household support members/support persons Name Relationship DOB  Romie Levee MOTHER   Pola Corn FATHER    Other support:    II  PSYCHOSOCIAL DATA Information Source:  Patient Interview  Event organiser Employment:   Financial resources:  [employer] If Medicaid - County:  [place] Other  [facility]   School / Grade:   Maternity Care Coordinator / Child Services Coordination / Early Interventions:  Cultural issues impacting care:    III  STRENGTHS Strengths  Adequate Resources  Home prepared for Child (including basic supplies)  Supportive family/friends   Strength comment:    IV  RISK FACTORS AND CURRENT PROBLEMS Current Problem:  YES   Risk Factor & Current Problem Patient Issue Family Issue Risk Factor / Current Problem Comment  Other - See comment [facility]    V  SOCIAL WORK ASSESSMENT Sw met with pt to inquire about reason for [facility] @ 30 weeks and offer resources as needed.  Pt told Sw that she was aware of pregnancy but had to wait until Medicaid was approved before she could start [facility].  She denies any illegal substance use and verbalized understanding of hospital drug testing policy.  As per chart review, drug screens were not ordered.  Pt lives with her parents, who she describes as her primary support system.  FOB was at the bedside  and appears to be supportive as well.  She reports that she has all the necessary supplies for the infant.  Pt told Sw that she was happy about delivery but a little sad that baby would not discharge home with her.  She denies history of depression.  Pt has reliable transportation and plans to visit regularly.  She has not selected a pediatrician at this time.  Sw will continue to follow and assist as needed until discharge.      VI SOCIAL WORK PLAN Social Work Plan  Psychosocial Support/Ongoing Assessment of Needs  No Further Intervention Required / No Barriers to Discharge   Type of pt/family education:   If child protective services report - county:   If child protective services report - date:   Information/referral to community resources comment:   Other social work plan:

## 2012-01-10 MED ORDER — IBUPROFEN 600 MG PO TABS: 600.0000 mg | ORAL_TABLET | Freq: Four times a day (QID) | ORAL | Status: DC | PRN

## 2012-01-10 MED ORDER — OXYCODONE-ACETAMINOPHEN 5-325 MG PO TABS: 1.0000 | ORAL_TABLET | ORAL | Status: DC | PRN

## 2012-01-10 NOTE — Discharge Summary (Signed)
Obstetric Discharge Summary Reason for Admission: rupture of membranes Prenatal Procedures: ultrasound Intrapartum Procedures: spontaneous vaginal delivery Postpartum Procedures: none Complications-Operative and Postpartum: none Hemoglobin  Date Value Range Status  01/09/2012 11.1* 12.0 - 15.0 g/dL Final     HCT  Date Value Range Status  01/09/2012 32.7* 36.0 - 46.0 % Final    Physical Exam:  General: alert and no distress Lochia: appropriate Uterine Fundus: firm Incision: none DVT Evaluation: No evidence of DVT seen on physical exam.  Discharge Diagnoses: Premature labor and PROM x>24 hours  Discharge Information: Date: 01/10/2012 Activity: pelvic rest Diet: routine Medications: PNV, Ibuprofen, Colace and Percocet Condition: stable Instructions: refer to practice specific booklet Discharge to: home Follow-up Information    Follow up with Antionette Char A, MD. Schedule an appointment as soon as possible for a visit in 6 weeks.   Contact information:   [address] [place] [address] [phone]          Newborn Data: Live born female  Birth Weight: 4 lb 8.7 oz (2060 g) APGAR: 7, 9    HARPER,CHARLES A 01/10/2012, 8:44 AM

## 2012-01-10 NOTE — Progress Notes (Signed)
Discharge instructions reviewed with patient.  Patient states understanding of home care and signs/symptoms to report to MD.  No home equipment needed.  Ambulated to NICU with staff and will discharge home with significant other.

## 2012-01-10 NOTE — Progress Notes (Signed)
Post Partum Day 2 Subjective: no complaints  Objective: Blood pressure 120/57, pulse 75, temperature 98.4 F (36.9 C), temperature source Oral, resp. rate 19, height 5\' 3"  (1.6 m), weight 130 lb (58.968 kg), last menstrual period 04/25/2011, SpO2 100.00%, unknown if currently breastfeeding.  Physical Exam:  General: alert and no distress Lochia: appropriate Uterine Fundus: firm Incision: none DVT Evaluation: No evidence of DVT seen on physical exam.   Basename 01/09/12 0520 01/07/12 2205  HGB 11.1* 11.6*  HCT 32.7* 33.5*    Assessment/Plan: Discharge home   LOS: 10 days   HARPER,CHARLES A 01/10/2012, 8:39 AM

## 2012-02-21 ENCOUNTER — Emergency Department (HOSPITAL_COMMUNITY)
Admission: EM | Admit: 2012-02-21 | Discharge: 2012-02-21 | Disposition: A | Discharge status: Home / Self Care | Payer: Medicaid Other | Attending: Emergency Medicine | Admitting: Emergency Medicine

## 2012-02-21 ENCOUNTER — Encounter (HOSPITAL_COMMUNITY): Payer: Self-pay | Admitting: Emergency Medicine

## 2012-02-21 ENCOUNTER — Emergency Department (HOSPITAL_COMMUNITY): Payer: Medicaid Other

## 2012-02-21 DIAGNOSIS — Y92410 Unspecified street and highway as the place of occurrence of the external cause: Secondary | ICD-10-CM | POA: Insufficient documentation

## 2012-02-21 DIAGNOSIS — S0993XA Unspecified injury of face, initial encounter: Secondary | ICD-10-CM | POA: Insufficient documentation

## 2012-02-21 DIAGNOSIS — Y9389 Activity, other specified: Secondary | ICD-10-CM | POA: Insufficient documentation

## 2012-02-21 DIAGNOSIS — S298XXA Other specified injuries of thorax, initial encounter: Secondary | ICD-10-CM | POA: Insufficient documentation

## 2012-02-21 MED ORDER — HYDROCODONE-ACETAMINOPHEN 5-325 MG PO TABS: 1.0000 | ORAL_TABLET | Freq: Four times a day (QID) | ORAL | Status: DC | PRN

## 2012-02-21 MED ORDER — DIAZEPAM 5 MG PO TABS: 5.0000 mg | ORAL_TABLET | Freq: Two times a day (BID) | ORAL | Status: DC

## 2012-02-21 MED ORDER — NAPROXEN 500 MG PO TABS: 500.0000 mg | ORAL_TABLET | Freq: Two times a day (BID) | ORAL | Status: DC
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
## 2012-02-23 NOTE — ED Provider Notes (Signed)
Medical screening examination/treatment/procedure(s) were performed by non-physician practitioner and as supervising physician I was immediately available for consultation/collaboration.  Rosevelt Luu, MD 02/23/12 2105 

## 2012-03-26 LAB — PROCEDURE REPORT - SCANNED: Pap: NEGATIVE

## 2012-12-27 ENCOUNTER — Emergency Department (HOSPITAL_COMMUNITY)
Admission: EM | Admit: 2012-12-27 | Discharge: 2012-12-27 | Discharge status: Absent without leave | Payer: Medicaid Other | Source: Home / Self Care

## 2013-02-10 NOTE — L&D Delivery Note (Signed)
Attestation of Attending Supervision of Advanced Practitioner (CNM/NP): Evaluation and management procedures were performed by the Advanced Practitioner under my supervision and collaboration.  I have reviewed the Advanced Practitioner's note and chart, and I agree with the management and plan.  Dakwan Pridgen 11/14/2013 7:11 AM

## 2013-02-10 NOTE — L&D Delivery Note (Signed)
Delivery Note At 3:01 AM a viable female was delivered via Vaginal, Spontaneous Delivery (Presentation: ; Occiput Anterior).  APGAR: see NICU note , ; weight pending.   Placenta status: Intact, Spontaneous.  Cord:  3v with the following complications: none.  Cord pH:   Anesthesia: None  Episiotomy: None Lacerations: None Suture Repair: none Est. Blood Loss (mL): 100   [id number] @[redacted]w[redacted]d  admitted to antepartum 1 week ago for PPROM with planned IOL at 8 am today started contracting and making cervical change.  She was admitted to [facility], rapidly progressed to 10 cm, and pushed to deliver over intact perineum.  NICU team present for delivery. Infant placed in mother's arms at delivery, cord clamped by MD, cut by FOB, and baby taken to warmer for evaluation.  Infant brought back to mother for bonding before transfer to NICU r/t gestational age.   Mom to postpartum.  Baby to NICU for [redacted]w[redacted]d  Tawanna Sat 11/14/2013, 3:11 AM  I was present for this delivery and agree with the above resident's note.  Fatima Blank Certified Nurse-Midwife

## 2013-05-07 IMAGING — US US OB TRANSVAGINAL
1 series · 14 of 22 positions shown · non-contrast
Comparison: none

CLINICAL DATA: Pregnant, right lower quadrant pain.

LIMITED OBSTETRIC ULTRASOUND
TECHNIQUE: Transabdominal and transvaginal sonographic images.

[Series 1: us ob transvaginal · 0.25mm/px · 22 acquisitions, 14 frames shown]
[im 1/22]
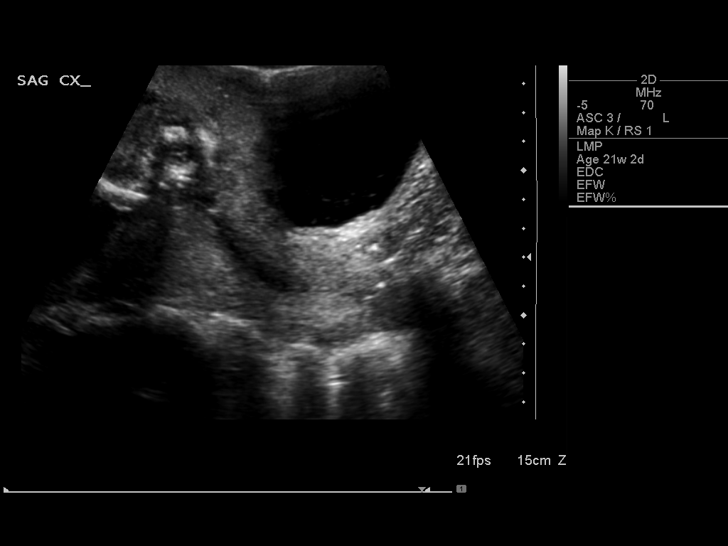
[im 3/22]
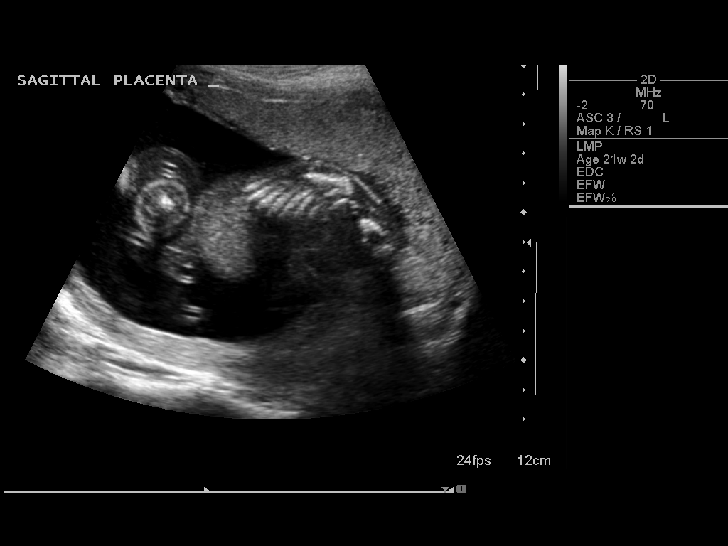
[im 4/22]
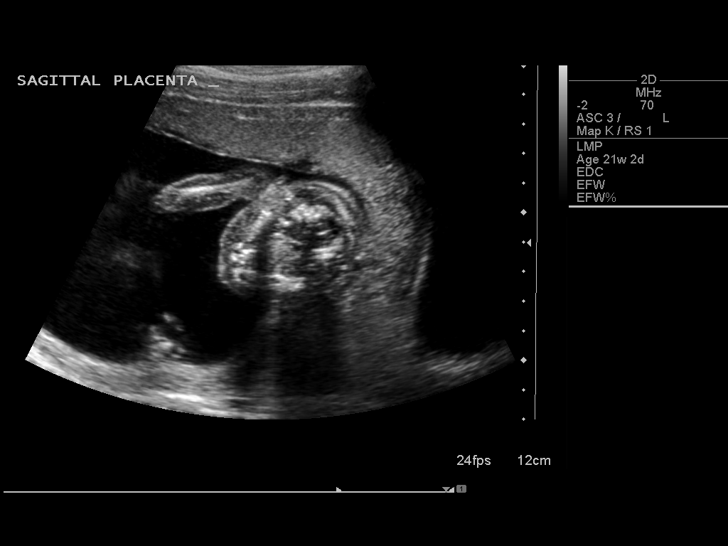
[im 6/22]
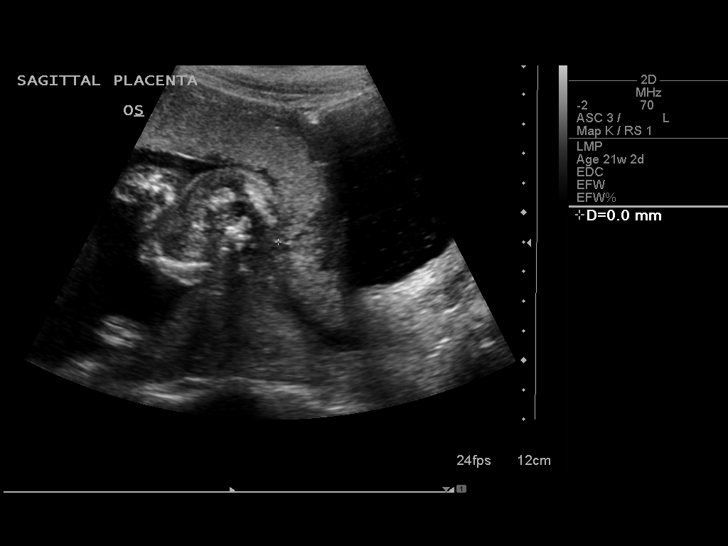
[im 8/22]
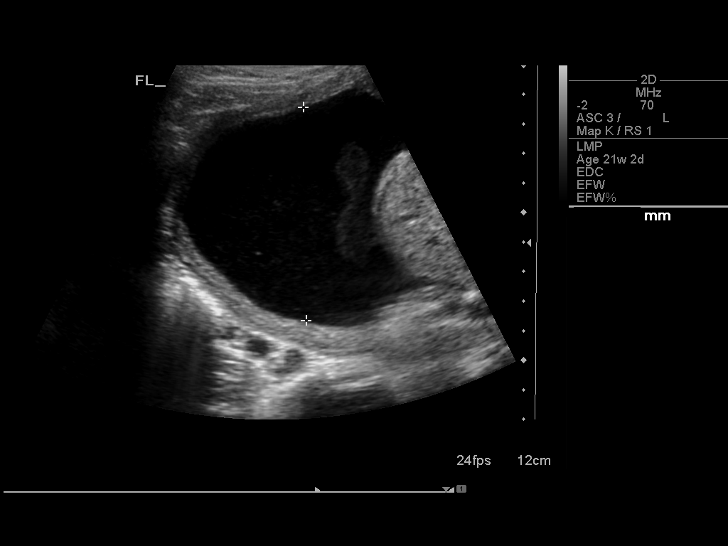
[im 9/22]
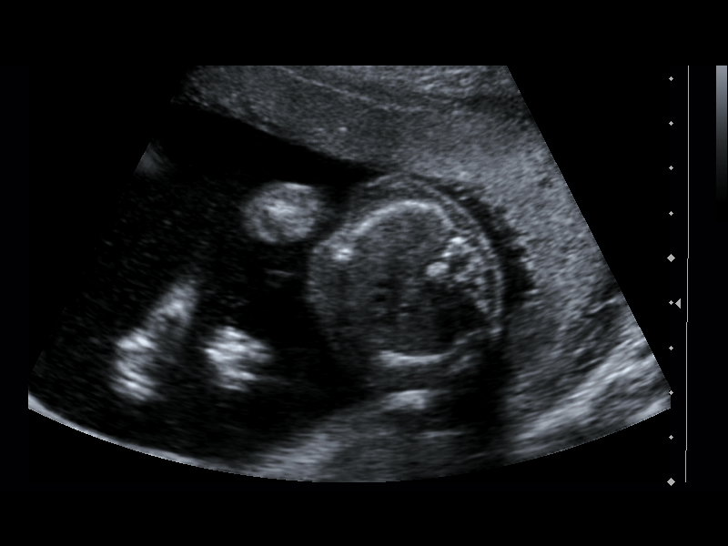
[im 11/22]
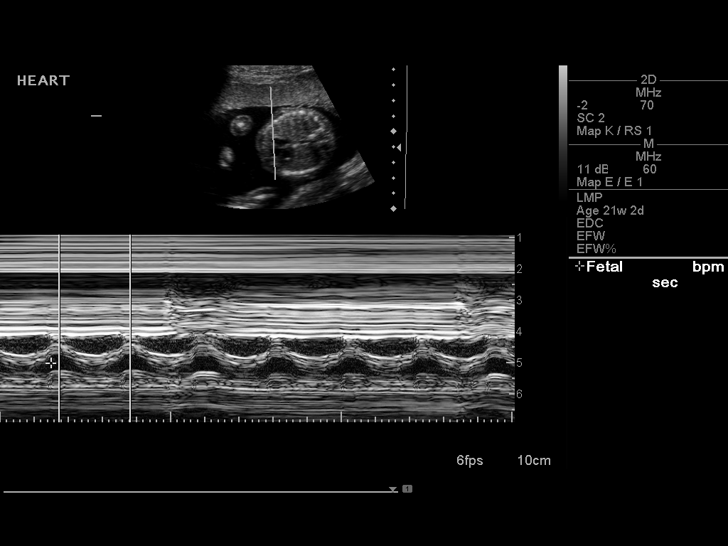
[im 12/22]
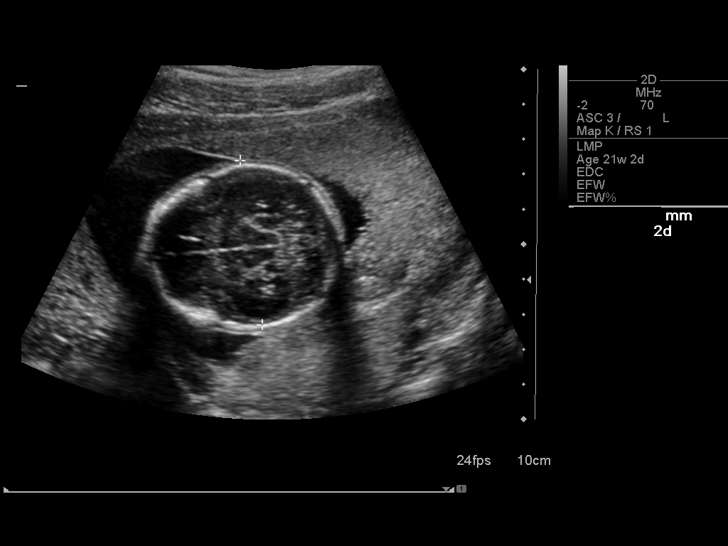
[im 14/22]
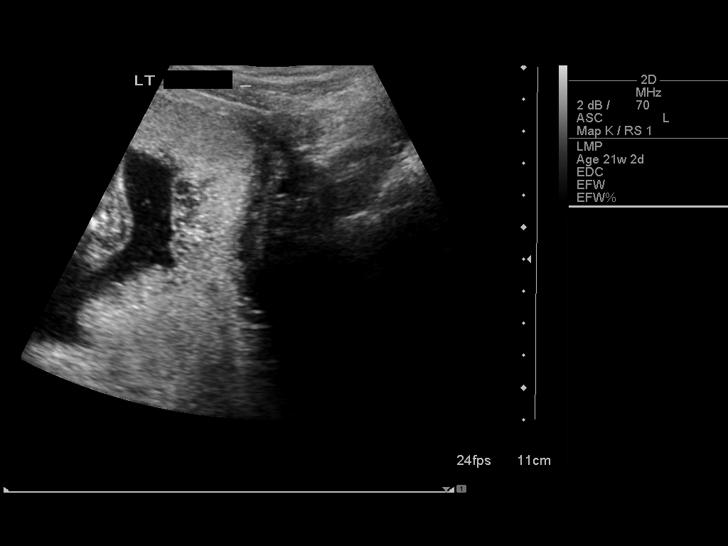
[im 15/22]
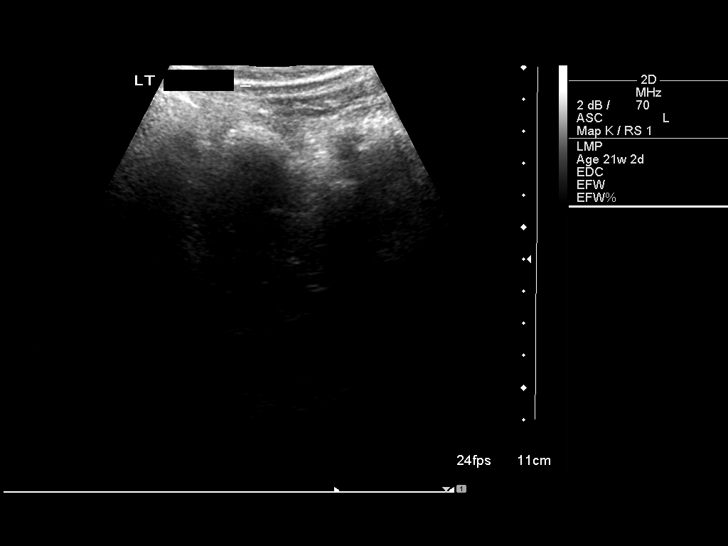
[im 17/22]
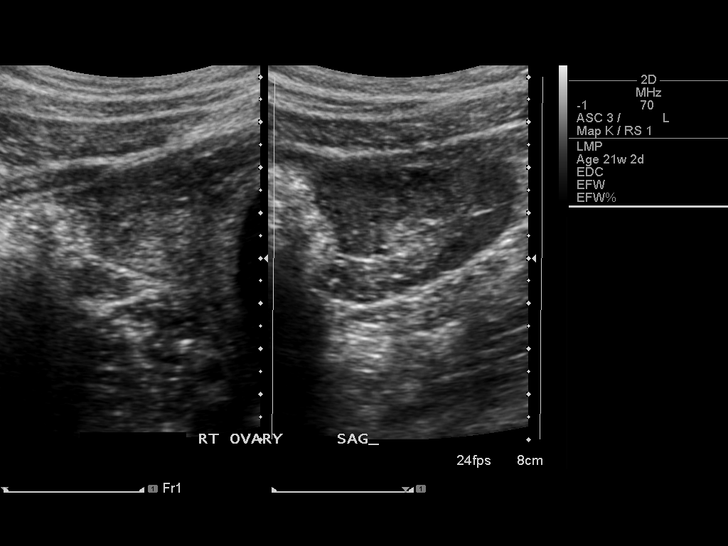
[im 19/22]
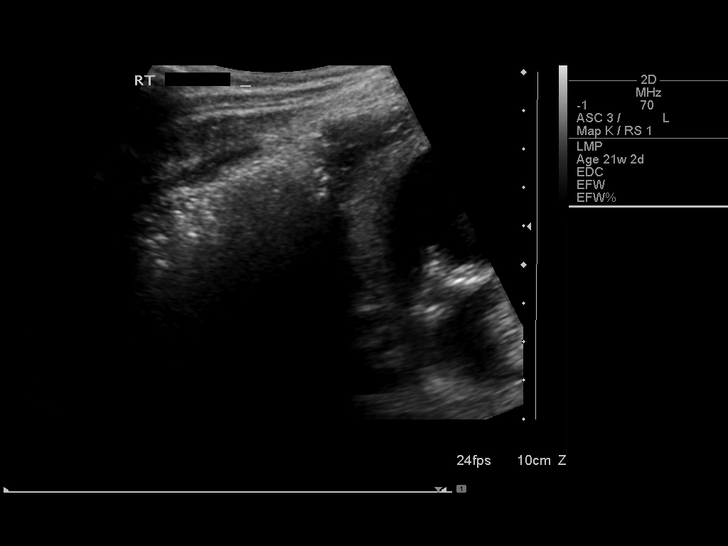
[im 20/22]
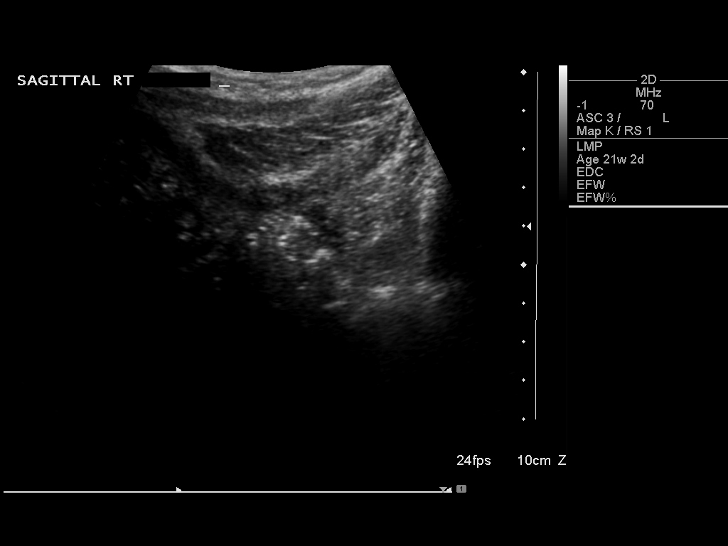
[im 22/22]
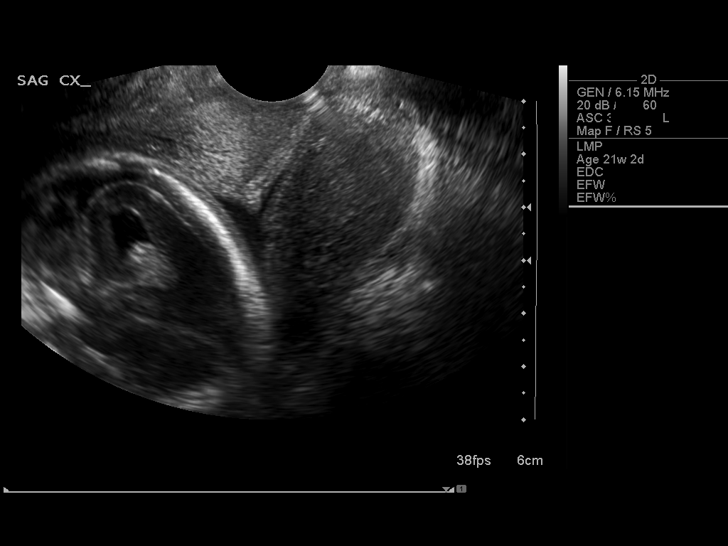

[14 of 22 positions shown; findings below may reference images not displayed]

FINDINGS: Number of Fetuses: 1
Heart Rate: 144 bpm
Movement: Identified
Presentation: Cephalic
Placental Location: Anterior
Previa: Not identified
Amniotic Fluid (Subjective): Within normal limits

BPD: 4.7cm   20w   2d   EDC: 02/06/2012

MATERNAL FINDINGS:
Cervix: Closed
Uterus/Adnexae: Right ovary measures 2.8 x 2.4 x 2.2 cm, within
normal sonographic appearance.  The left ovary not identified.
IMPRESSION: Intrauterine gestation with cardiac activity and movement
documented.  Estimated age of 20 weeks 2 days by BPD.

Normal sonographic appearance to the right ovary.  The left ovary
is not visualized.

Recommend followup with non-emergent complete OB 14+ wk US
examination for fetal biometric evaluation and anatomic survey if
not already performed.

## 2013-05-19 ENCOUNTER — Inpatient Hospital Stay (HOSPITAL_COMMUNITY)
Admission: AD | Admit: 2013-05-19 | Discharge: 2013-05-19 | Disposition: A | Discharge status: Home / Self Care | Payer: Medicaid Other | Source: Ambulatory Visit | Attending: Family Medicine | Admitting: Family Medicine

## 2013-05-19 ENCOUNTER — Inpatient Hospital Stay (HOSPITAL_COMMUNITY): Payer: Medicaid Other

## 2013-05-19 ENCOUNTER — Encounter (HOSPITAL_COMMUNITY): Payer: Self-pay | Admitting: *Deleted

## 2013-05-19 DIAGNOSIS — N898 Other specified noninflammatory disorders of vagina: Secondary | ICD-10-CM | POA: Insufficient documentation

## 2013-05-19 DIAGNOSIS — O26899 Other specified pregnancy related conditions, unspecified trimester: Secondary | ICD-10-CM

## 2013-05-19 DIAGNOSIS — O210 Mild hyperemesis gravidarum: Secondary | ICD-10-CM | POA: Insufficient documentation

## 2013-05-19 DIAGNOSIS — R1013 Epigastric pain: Secondary | ICD-10-CM | POA: Insufficient documentation

## 2013-05-19 DIAGNOSIS — R109 Unspecified abdominal pain: Secondary | ICD-10-CM

## 2013-05-19 DIAGNOSIS — O219 Vomiting of pregnancy, unspecified: Secondary | ICD-10-CM

## 2013-05-19 LAB — CBC WITH DIFFERENTIAL/PLATELET
Basophils Absolute: 0 10*3/uL (ref 0.0–0.1)
Basophils Relative: 0 % (ref 0–1)
Eosinophils Absolute: 0 10*3/uL (ref 0.0–0.7)
Eosinophils Relative: 1 % (ref 0–5)
HCT: 33.6 % — ABNORMAL LOW (ref 36.0–46.0)
Hemoglobin: 11.4 g/dL — ABNORMAL LOW (ref 12.0–15.0)
Lymphocytes Relative: 31 % (ref 12–46)
Lymphs Abs: 2 10*3/uL (ref 0.7–4.0)
MCH: 28.4 pg (ref 26.0–34.0)
MCHC: 33.9 g/dL (ref 30.0–36.0)
MCV: 83.8 fL (ref 78.0–100.0)
Monocytes Absolute: 0.5 10*3/uL (ref 0.1–1.0)
Monocytes Relative: 8 % (ref 3–12)
Neutro Abs: 4 10*3/uL (ref 1.7–7.7)
Neutrophils Relative %: 61 % (ref 43–77)
Platelets: 197 10*3/uL (ref 150–400)
RBC: 4.01 MIL/uL (ref 3.87–5.11)
RDW: 15.4 % (ref 11.5–15.5)
WBC: 6.6 10*3/uL (ref 4.0–10.5)

## 2013-05-19 LAB — URINALYSIS, ROUTINE W REFLEX MICROSCOPIC
Bilirubin Urine: NEGATIVE
Glucose, UA: NEGATIVE mg/dL
Hgb urine dipstick: NEGATIVE
KETONES UR: NEGATIVE mg/dL
NITRITE: NEGATIVE
PROTEIN: NEGATIVE mg/dL
Specific Gravity, Urine: 1.02 (ref 1.005–1.030)
UROBILINOGEN UA: 0.2 mg/dL (ref 0.0–1.0)
pH: 6.5 (ref 5.0–8.0)

## 2013-05-19 LAB — URINE MICROSCOPIC-ADD ON

## 2013-05-19 LAB — POCT PREGNANCY, URINE: Preg Test, Ur: POSITIVE — AB

## 2013-05-19 LAB — WET PREP, GENITAL
Trich, Wet Prep: NONE SEEN
Yeast Wet Prep HPF POC: NONE SEEN

## 2013-05-19 MED ORDER — PROMETHAZINE HCL 25 MG PO TABS: 12.5000 mg | ORAL_TABLET | Freq: Four times a day (QID) | ORAL | Status: DC | PRN

## 2013-05-19 MED ORDER — RANITIDINE HCL 150 MG PO CAPS: 150.0000 mg | ORAL_CAPSULE | Freq: Two times a day (BID) | ORAL | Status: DC

## 2013-05-19 NOTE — MAU Provider Note (Signed)
CSN: [id number]     Arrival date & time 05/19/13  1144 History   None    No chief complaint on file.    (Consider location/radiation/quality/duration/timing/severity/associated sxs/prior Treatment) Patient is a 23 y.o. female presenting with abdominal pain. The history is provided by the patient.  Abdominal Pain The primary symptoms of the illness include abdominal pain, nausea, vomiting and vaginal discharge. The primary symptoms of the illness do not include fever, shortness of breath, diarrhea, dysuria or vaginal bleeding. The current episode started more than 2 days ago.  The vaginal discharge is not associated with dysuria.   Additional symptoms associated with the illness include chills and back pain.   Cindy Armstrong is a 23 y.o. [phone] @ [redacted]w[redacted]d gestation who presents to the MAU with abdominal pain.  The pain started 3 days ago. She describes the pain in the lower abdomen as campy. The pain in the epigastric region is burning and is worse with nausea and vomting.  Past Medical History  Diagnosis Date  . No pertinent past medical history    Past Surgical History  Procedure Laterality Date  . No past surgeries     Family History  Problem Relation Age of Onset  . Alcohol abuse Neg Hx   . Arthritis Neg Hx   . Asthma Neg Hx   . Birth defects Neg Hx   . Cancer Neg Hx   . COPD Neg Hx   . Depression Neg Hx   . Diabetes Neg Hx   . Drug abuse Neg Hx   . Early death Neg Hx   . Hearing loss Neg Hx   . Heart disease Neg Hx   . Hyperlipidemia Neg Hx   . Hypertension Neg Hx   . Kidney disease Neg Hx   . Learning disabilities Neg Hx   . Mental illness Neg Hx   . Mental retardation Neg Hx   . Miscarriages / Stillbirths Neg Hx   . Stroke Neg Hx   . Vision loss Neg Hx   . Other Neg Hx    History  Substance Use Topics  . Smoking status: Never Smoker   . Smokeless tobacco: Never Used  . Alcohol Use: No   OB History   Grav Para Term Preterm Abortions TAB SAB Ect Mult Living    2 1 0 1 0 0 0 0 0 1      Review of Systems  Constitutional: Positive for chills. Negative for fever.  Eyes: Negative for visual disturbance.  Respiratory: Negative for cough and shortness of breath.   Gastrointestinal: Positive for nausea, vomiting and abdominal pain. Negative for diarrhea.  Genitourinary: Positive for vaginal discharge. Negative for dysuria and vaginal bleeding.  Musculoskeletal: Positive for back pain.  Skin: Negative for rash.  Neurological: Positive for headaches. Negative for light-headedness.  Psychiatric/Behavioral: Negative for confusion. The patient is not nervous/anxious.       Allergies  Review of patient's allergies indicates no known allergies.  Home Medications  No current outpatient prescriptions on file. BP 113/53  Pulse 78  Temp(Src) 98.2 F (36.8 C) (Oral)  Resp 16  Ht 5\' 3"  (1.6 m)  Wt 113 lb 3.2 oz (51.347 kg)  BMI 20.06 kg/m2  SpO2 100%  LMP 03/21/2013  Breastfeeding? No Physical Exam  Nursing note and vitals reviewed. Constitutional: She is oriented to person, place, and time. She appears well-developed and well-nourished. No distress.  HENT:  Head: Normocephalic.  Eyes: EOM are normal.  Neck: Neck supple.  Cardiovascular:  Normal rate.   Pulmonary/Chest: Effort normal.  Abdominal: Soft. There is tenderness in the epigastric area. There is no CVA tenderness.  Genitourinary:  External genitalia without lesions. White discharge vaginal vault. Mild CMT, left adnexal tenderness. Uterus approximately 8 week size.   Musculoskeletal: Normal range of motion.  Neurological: She is alert and oriented to person, place, and time. No cranial nerve deficit.  Skin: Skin is warm and dry.  Psychiatric: She has a normal mood and affect. Her behavior is normal.     Results for orders placed during the hospital encounter of 05/19/13 (from the past 24 hour(s))  URINALYSIS, ROUTINE W REFLEX MICROSCOPIC     Status: Abnormal   Collection Time     05/19/13 11:55 AM      Result Value Ref Range   Color, Urine YELLOW  YELLOW   APPearance CLEAR  CLEAR   Specific Gravity, Urine 1.020  1.005 - 1.030   pH 6.5  5.0 - 8.0   Glucose, UA NEGATIVE  NEGATIVE mg/dL   Hgb urine dipstick NEGATIVE  NEGATIVE   Bilirubin Urine NEGATIVE  NEGATIVE   Ketones, ur NEGATIVE  NEGATIVE mg/dL   Protein, ur NEGATIVE  NEGATIVE mg/dL   Urobilinogen, UA 0.2  0.0 - 1.0 mg/dL   Nitrite NEGATIVE  NEGATIVE   Leukocytes, UA SMALL (*) NEGATIVE  URINE MICROSCOPIC-ADD ON     Status: Abnormal   Collection Time    05/19/13 11:55 AM      Result Value Ref Range   Squamous Epithelial / LPF MANY (*) RARE   WBC, UA 0-2  <3 WBC/hpf   Bacteria, UA RARE  RARE  POCT PREGNANCY, URINE     Status: Abnormal   Collection Time    05/19/13 12:07 PM      Result Value Ref Range   Preg Test, Ur POSITIVE (*) NEGATIVE  CBC WITH DIFFERENTIAL     Status: Abnormal   Collection Time    05/19/13  1:35 PM      Result Value Ref Range   WBC 6.6  4.0 - 10.5 K/uL   RBC 4.01  3.87 - 5.11 MIL/uL   Hemoglobin 11.4 (*) 12.0 - 15.0 g/dL   HCT 33.6 (*) 36.0 - 46.0 %   MCV 83.8  78.0 - 100.0 fL   MCH 28.4  26.0 - 34.0 pg   MCHC 33.9  30.0 - 36.0 g/dL   RDW 15.4  11.5 - 15.5 %   Platelets 197  150 - 400 K/uL   Neutrophils Relative % 61  43 - 77 %   Neutro Abs 4.0  1.7 - 7.7 K/uL   Lymphocytes Relative 31  12 - 46 %   Lymphs Abs 2.0  0.7 - 4.0 K/uL   Monocytes Relative 8  3 - 12 %   Monocytes Absolute 0.5  0.1 - 1.0 K/uL   Eosinophils Relative 1  0 - 5 %   Eosinophils Absolute 0.0  0.0 - 0.7 K/uL   Basophils Relative 0  0 - 1 %   Basophils Absolute 0.0  0.0 - 0.1 K/uL   US Ob Comp Less 14 Wks  05/19/2013   CLINICAL DATA:  23 year old female with pain and positive pregnancy test. Uncertain LMP, estimated gestational age by LMP 8 weeks and 3 days. Initial encounter.  EXAM: OBSTETRIC <14 WK ULTRASOUND  TECHNIQUE: Transabdominal ultrasound was performed for evaluation of the gestation as well  as the maternal uterus and adnexal regions.  COMPARISON:  None  relevant.  FINDINGS: Intrauterine gestational sac: Single  Yolk sac:  Visible  Embryo:  Visible  Cardiac Activity: Detected  Heart Rate: 160 bpm  CRL:   1.8 cm  8 w 2 d                  [place] EDC: 12/27/2013  Maternal uterus/adnexae: No subchorionic hemorrhage or pelvic free fluid. Both ovaries appear normal with multiple follicles. The corpus luteum may be on the left.  IMPRESSION: Single living IUP demonstrated. No acute maternal findings visualized.   Electronically Signed   By: Lars Pinks M.D.   On: 05/19/2013 15:12    ED Course  Procedures  MDM  23 y.o. female @ [redacted]w[redacted]d gestation with nausea and vomiting and epigastric pain. Will treat with Zantac and Phenergan. She will start her prenatal care. She will return for any problems. Stable for discharge with IUP and no dehydration. I have reviewed this patient's vital signs, nurses notes, appropriate labs and imaging.  I have discussed findings and plan of care with the patient and she voices understanding.    Medication List    TAKE these medications       promethazine 25 MG tablet  Commonly known as:  PHENERGAN  Take 0.5 tablets (12.5 mg total) by mouth every 6 (six) hours as needed for nausea or vomiting.     ranitidine 150 MG capsule  Commonly known as:  ZANTAC  Take 1 capsule (150 mg total) by mouth 2 (two) times daily.      ASK your doctor about these medications       acetaminophen 500 MG tablet  Commonly known as:  TYLENOL  Take 500 mg by mouth every 4 (four) hours as needed for moderate pain.     calcium carbonate 500 MG chewable tablet  Commonly known as:  TUMS - dosed in mg elemental calcium  Chew 1 tablet by mouth once.

## 2013-05-19 NOTE — Discharge Instructions (Signed)
Start your prenatal care and prenatal vitamins. Return as needed for problems.   Abdominal Pain During Pregnancy Belly (abdominal) pain is common during pregnancy. Most of the time, it is not a serious problem. Other times, it can be a sign that something is wrong with the pregnancy. Always tell your doctor if you have belly pain. HOME CARE Monitor your belly pain for any changes. The following actions may help you feel better:  Do not have sex (intercourse) or put anything in your vagina until you feel better.  Rest until your pain stops.  Drink clear fluids if you feel sick to your stomach (nauseous). Do not eat solid food until you feel better.  Only take medicine as told by your doctor.  Keep all doctor visits as told. GET HELP RIGHT AWAY IF:   You are bleeding, leaking fluid, or pieces of tissue come out of your vagina.  You have more pain or cramping.  You keep throwing up (vomiting).  You have pain when you pee (urinate) or have blood in your pee.  You have a fever.  You do not feel your baby moving as much.  You feel very weak or feel like passing out.  You have trouble breathing, with or without belly pain.  You have a very bad headache and belly pain.  You have fluid leaking from your vagina and belly pain.  You keep having watery poop (diarrhea).  Your belly pain does not go away after resting, or the pain gets worse. MAKE SURE YOU:   Understand these instructions.  Will watch your condition.  Will get help right away if you are not doing well or get worse. Document Released: 01/15/2009 Document Revised: 09/29/2012 Document Reviewed: 08/26/2012 [facility] Patient Information 2014 [place], [place].

## 2013-05-19 NOTE — MAU Note (Signed)
For about the 3 days has bad pain in upper abd, comes and goes.  Knows she would have the sickness, but did not expect the bad pains.  Had been to her urgent care- was waiting for a referral to an OB/Gyn.

## 2013-05-20 LAB — GC/CHLAMYDIA PROBE AMP
CT Probe RNA: NEGATIVE
GC Probe RNA: NEGATIVE

## 2013-05-20 NOTE — MAU Provider Note (Signed)
Attestation of Attending Supervision of Advanced Practitioner (PA/CNM/NP): Evaluation and management procedures were performed by the Advanced Practitioner under my supervision and collaboration.  I have reviewed the Advanced Practitioner's note and chart, and I agree with the management and plan.  Eugene Zeiders, DO Attending Physician Faculty Practice, [facility]   

## 2013-05-24 ENCOUNTER — Encounter (HOSPITAL_COMMUNITY): Payer: Self-pay

## 2013-05-24 ENCOUNTER — Inpatient Hospital Stay (HOSPITAL_COMMUNITY)
Admission: AD | Admit: 2013-05-24 | Discharge: 2013-05-24 | Disposition: A | Discharge status: Home / Self Care | Payer: Medicaid Other | Source: Ambulatory Visit | Attending: Obstetrics & Gynecology | Admitting: Obstetrics & Gynecology

## 2013-05-24 DIAGNOSIS — N949 Unspecified condition associated with female genital organs and menstrual cycle: Secondary | ICD-10-CM | POA: Insufficient documentation

## 2013-05-24 DIAGNOSIS — O210 Mild hyperemesis gravidarum: Secondary | ICD-10-CM | POA: Insufficient documentation

## 2013-05-24 DIAGNOSIS — O34599 Maternal care for other abnormalities of gravid uterus, unspecified trimester: Secondary | ICD-10-CM | POA: Insufficient documentation

## 2013-05-24 DIAGNOSIS — N83209 Unspecified ovarian cyst, unspecified side: Secondary | ICD-10-CM | POA: Insufficient documentation

## 2013-05-24 DIAGNOSIS — R109 Unspecified abdominal pain: Secondary | ICD-10-CM | POA: Insufficient documentation

## 2013-05-24 DIAGNOSIS — N390 Urinary tract infection, site not specified: Secondary | ICD-10-CM

## 2013-05-24 LAB — URINE MICROSCOPIC-ADD ON

## 2013-05-24 LAB — URINALYSIS, ROUTINE W REFLEX MICROSCOPIC
Bilirubin Urine: NEGATIVE
GLUCOSE, UA: NEGATIVE mg/dL
Hgb urine dipstick: NEGATIVE
Ketones, ur: >80 mg/dL — AB
Leukocytes, UA: NEGATIVE
Nitrite: POSITIVE — AB
Protein, ur: NEGATIVE mg/dL
Urobilinogen, UA: 0.2 mg/dL (ref 0.0–1.0)
pH: 6 (ref 5.0–8.0)

## 2013-05-24 LAB — CBC
HCT: 35 % — ABNORMAL LOW (ref 36.0–46.0)
Hemoglobin: 12 g/dL (ref 12.0–15.0)
MCH: 28.6 pg (ref 26.0–34.0)
MCHC: 34.3 g/dL (ref 30.0–36.0)
MCV: 83.3 fL (ref 78.0–100.0)
Platelets: 193 10*3/uL (ref 150–400)
RBC: 4.2 MIL/uL (ref 3.87–5.11)
RDW: 15.4 % (ref 11.5–15.5)
WBC: 9.5 10*3/uL (ref 4.0–10.5)

## 2013-05-24 MED ORDER — SODIUM CHLORIDE 0.9 % IV SOLN
Freq: Once | INTRAVENOUS | Status: AC
  Administered 2013-05-24: 20:00:00 via INTRAVENOUS

## 2013-05-24 MED ORDER — DEXTROSE 5 % IV SOLN
1.0000 g | Freq: Once | INTRAVENOUS | Status: AC
  Administered 2013-05-24: 1 g via INTRAVENOUS
  Filled 2013-05-24: qty 10

## 2013-05-24 MED ORDER — CEPHALEXIN 500 MG PO CAPS: 500.0000 mg | ORAL_CAPSULE | Freq: Four times a day (QID) | ORAL | Status: DC

## 2013-05-24 NOTE — MAU Provider Note (Signed)
History     CSN: [id number]  Arrival date and time: 05/24/13 1652   First Provider Initiated Contact with Patient 05/24/13 1950      Chief Complaint  Patient presents with  . Abdominal Pain   HPI  Pt is a 23 yo G2P0101 at [redacted]w[redacted]d wks IUP here with report of lower pelvic pain that started two weeks ago and increased in intensity upon waking today.  Pt states has an ovarian cyst, however ultrasound on 05/19/13 showed a normal ultrasound.  Pt reports increased nausea and vomiting today.  Reports vomiting approximately 5-6x today.  +chills and mid back pain.  Denies fever.   Past Medical History  Diagnosis Date  . No pertinent past medical history   . Preterm labor     Past Surgical History  Procedure Laterality Date  . No past surgeries      Family History  Problem Relation Age of Onset  . Alcohol abuse Neg Hx   . Arthritis Neg Hx   . Asthma Neg Hx   . Birth defects Neg Hx   . Cancer Neg Hx   . COPD Neg Hx   . Depression Neg Hx   . Diabetes Neg Hx   . Drug abuse Neg Hx   . Early death Neg Hx   . Hearing loss Neg Hx   . Heart disease Neg Hx   . Hyperlipidemia Neg Hx   . Hypertension Neg Hx   . Kidney disease Neg Hx   . Learning disabilities Neg Hx   . Mental illness Neg Hx   . Mental retardation Neg Hx   . Miscarriages / Stillbirths Neg Hx   . Stroke Neg Hx   . Vision loss Neg Hx   . Other Neg Hx     History  Substance Use Topics  . Smoking status: Never Smoker   . Smokeless tobacco: Never Used  . Alcohol Use: No    Allergies: No Known Allergies  Prescriptions prior to admission  Medication Sig Dispense Refill  . acetaminophen (TYLENOL) 500 MG tablet Take 500 mg by mouth every 4 (four) hours as needed for moderate pain.      . calcium carbonate (TUMS - DOSED IN MG ELEMENTAL CALCIUM) 500 MG chewable tablet Chew 1 tablet by mouth daily as needed for indigestion or heartburn.       . Prenatal Vit-Fe Fumarate-FA (PRENATAL MULTIVITAMIN) TABS tablet Take 1 tablet  by mouth daily at 12 noon.      . promethazine (PHENERGAN) 25 MG tablet Take 0.5 tablets (12.5 mg total) by mouth every 6 (six) hours as needed for nausea or vomiting.  20 tablet  0  . ranitidine (ZANTAC) 150 MG capsule Take 1 capsule (150 mg total) by mouth 2 (two) times daily.  60 capsule  0    Review of Systems  Constitutional: Positive for chills. Negative for fever.  Respiratory: Negative for cough and shortness of breath.   Gastrointestinal: Positive for nausea, vomiting and abdominal pain. Negative for diarrhea and constipation.  Genitourinary: Positive for dysuria. Negative for urgency, frequency and hematuria.  Musculoskeletal: Positive for back pain.   Physical Exam   Blood pressure 113/50, pulse 62, temperature 98.9 F (37.2 C), temperature source Oral, resp. rate 16, last menstrual period 03/21/2013, SpO2 99.00%, not currently breastfeeding.  Physical Exam  Constitutional: She is oriented to person, place, and time. She appears well-developed and well-nourished. No distress.  Appears uncomfortable  HENT:  Head: Normocephalic.  Neck: Normal range of  motion. Neck supple.  Cardiovascular: Normal rate, regular rhythm and normal heart sounds.   Respiratory: Effort normal and breath sounds normal. No respiratory distress.  GI: Soft. She exhibits no mass. There is no tenderness. There is CVA tenderness (left). There is no rebound and no guarding.  Genitourinary: Uterus is enlarged. Right adnexum displays no mass, no tenderness and no fullness. Left adnexum displays no mass, no tenderness and no fullness. No bleeding around the vagina.  Musculoskeletal: Normal range of motion.  Neurological: She is alert and oriented to person, place, and time.  Skin: Skin is warm and dry.   2030 Report given to Rozelle Logan who assumes care of patient. [place], CNM   MAU Course  Procedures   Assessment and Plan

## 2013-05-24 NOTE — Discharge Instructions (Signed)
Urinary Tract Infection °A urinary tract infection (UTI) can occur any place along the urinary tract. The tract includes the kidneys, ureters, bladder, and urethra. A type of germ called bacteria often causes a UTI. UTIs are often helped with antibiotic medicine.  °HOME CARE  °· If given, take antibiotics as told by your doctor. Finish them even if you start to feel better. °· Drink enough fluids to keep your pee (urine) clear or pale yellow. °· Avoid tea, drinks with caffeine, and bubbly (carbonated) drinks. °· Pee often. Avoid holding your pee in for a long time. °· Pee before and after having sex (intercourse). °· Wipe from front to back after you poop (bowel movement) if you are a woman. Use each tissue only once. °GET HELP RIGHT AWAY IF:  °· You have back pain. °· You have lower belly (abdominal) pain. °· You have chills. °· You feel sick to your stomach (nauseous). °· You throw up (vomit). °· Your burning or discomfort with peeing does not go away. °· You have a fever. °· Your symptoms are not better in 3 days. °MAKE SURE YOU:  °· Understand these instructions. °· Will watch your condition. °· Will get help right away if you are not doing well or get worse. °Document Released: 07/16/2007 Document Revised: 10/22/2011 Document Reviewed: 08/28/2011 °Exit[employer]® Patient Information ©2014 [employer]. ° °

## 2013-05-24 NOTE — MAU Note (Signed)
Patient states she was sen in MAU and diagnosed with a ovarian cyst. States she continues to have pain all over the left abdomen and side. Continues to have nausea and states the medication is not helping. Denies bleeding or discharge.

## 2013-05-26 LAB — URINE CULTURE
Colony Count: NO GROWTH
Culture: NO GROWTH

## 2013-06-10 ENCOUNTER — Encounter (HOSPITAL_COMMUNITY): Payer: Self-pay | Admitting: *Deleted

## 2013-06-10 ENCOUNTER — Inpatient Hospital Stay (HOSPITAL_COMMUNITY)
Admission: AD | Admit: 2013-06-10 | Discharge: 2013-06-10 | Disposition: A | Discharge status: Home / Self Care | Payer: Medicaid Other | Source: Ambulatory Visit | Attending: Obstetrics and Gynecology | Admitting: Obstetrics and Gynecology

## 2013-06-10 DIAGNOSIS — R109 Unspecified abdominal pain: Secondary | ICD-10-CM | POA: Insufficient documentation

## 2013-06-10 DIAGNOSIS — N930 Postcoital and contact bleeding: Secondary | ICD-10-CM

## 2013-06-10 DIAGNOSIS — O26859 Spotting complicating pregnancy, unspecified trimester: Secondary | ICD-10-CM | POA: Insufficient documentation

## 2013-06-10 LAB — URINE MICROSCOPIC-ADD ON

## 2013-06-10 LAB — URINALYSIS, ROUTINE W REFLEX MICROSCOPIC
Bilirubin Urine: NEGATIVE
Glucose, UA: NEGATIVE mg/dL
Ketones, ur: NEGATIVE mg/dL
Leukocytes, UA: NEGATIVE
Nitrite: NEGATIVE
Protein, ur: NEGATIVE mg/dL
SPECIFIC GRAVITY, URINE: 1.02 (ref 1.005–1.030)
UROBILINOGEN UA: 0.2 mg/dL (ref 0.0–1.0)
pH: 6.5 (ref 5.0–8.0)

## 2013-06-10 MED ORDER — ONDANSETRON 4 MG PO TBDP: 4.0000 mg | ORAL_TABLET | Freq: Four times a day (QID) | ORAL | Status: DC | PRN

## 2013-06-10 NOTE — MAU Provider Note (Signed)
Chief Complaint: Abdominal Cramping and Vaginal Bleeding  @MAUPATCONTACT @ SUBJECTIVE HPI: Cindy Armstrong is a 23 y.o. G2P0101 at [redacted]w[redacted]d by LMP who presents with abdominal cramping x1 week and spotting x1 day. Pt reports painful cramping for the past week and noticed pink spotting with some small red specks last night 06/09/13 and this morning 06/10/13. Pt says cramps last all day but does not describe any abdominal pain. Pt did have intercourse with her fiance 2 nights ago. Pt was treated for bacterial vaginosis 2 weeks ago with keflex. Still reports some residual dysuria from previous UTI.   Past Medical History  Diagnosis Date  . No pertinent past medical history   . Preterm labor    OB History  Gravida Para Term Preterm AB SAB TAB Ectopic Multiple Living  2 1 0 1 0 0 0 0 0 1     # Outcome Date GA Lbr Len/2nd Weight Sex Delivery Anes PTL Lv  2 CUR           1 PRE 01/08/12 [redacted]w[redacted]d 04:43 / 00:21 2.06 kg (4 lb 8.7 oz) M SVD EPI  Y     Comments: preterm     Past Surgical History  Procedure Laterality Date  . No past surgeries     History   Social History  . Marital Status: Single    Spouse Name: N/A    Number of Children: N/A  . Years of Education: N/A   Occupational History  . Not on file.   Social History Main Topics  . Smoking status: Never Smoker   . Smokeless tobacco: Never Used  . Alcohol Use: No  . Drug Use: No  . Sexual Activity: Yes    Birth Control/ Protection: None   Other Topics Concern  . Not on file   Social History Narrative  . No narrative on file   No current facility-administered medications on file prior to encounter.   Current Outpatient Prescriptions on File Prior to Encounter  Medication Sig Dispense Refill  . acetaminophen (TYLENOL) 500 MG tablet Take 500 mg by mouth every 4 (four) hours as needed for moderate pain.      . calcium carbonate (TUMS - DOSED IN MG ELEMENTAL CALCIUM) 500 MG chewable tablet Chew 1 tablet by mouth daily as needed for  indigestion or heartburn.       . cephALEXin (KEFLEX) 500 MG capsule Take 1 capsule (500 mg total) by mouth 4 (four) times daily.  20 capsule  0  . Prenatal Vit-Fe Fumarate-FA (PRENATAL MULTIVITAMIN) TABS tablet Take 1 tablet by mouth daily at 12 noon.      . promethazine (PHENERGAN) 25 MG tablet Take 0.5 tablets (12.5 mg total) by mouth every 6 (six) hours as needed for nausea or vomiting.  20 tablet  0   No Known Allergies  ROS: Pertinent items in HPI  OBJECTIVE Blood pressure 122/67, pulse 75, temperature 98.4 F (36.9 C), temperature source Oral, resp. rate 18, height 5' 1.5" (1.562 m), weight 50.803 kg (112 lb), last menstrual period 03/21/2013, not currently breastfeeding. GENERAL: Well-developed, well-nourished female in no acute distress.  HEENT: Normocephalic HEART: normal rate RESP: normal effort ABDOMEN: Soft, slight tenderness to deep palpation in LLQ as well as suprapubic area EXTREMITIES: Nontender, no edema NEURO: Alert and oriented SPECULUM EXAM: NEFG, physiologic discharge, no blood noted, cervix clean Speculum exam: Vagina - moderate amount of creamy discharge with evidence of old blood, no odor Cervix - No contact bleeding, small degree of ectropion Bimanual  exam: Cervix closed, no CMT Uterus non tender, normal size Adnexa non tender, no masses bilaterally GC/Chlam, wet prep done Chaperone present for exam.   LAB RESULTS Results for orders placed during the hospital encounter of 06/10/13 (from the past 24 hour(s))  URINALYSIS, ROUTINE W REFLEX MICROSCOPIC     Status: Abnormal   Collection Time    06/10/13  9:56 AM      Result Value Ref Range   Color, Urine YELLOW  YELLOW   APPearance CLEAR  CLEAR   Specific Gravity, Urine 1.020  1.005 - 1.030   pH 6.5  5.0 - 8.0   Glucose, UA NEGATIVE  NEGATIVE mg/dL   Hgb urine dipstick TRACE (*) NEGATIVE   Bilirubin Urine NEGATIVE  NEGATIVE   Ketones, ur NEGATIVE  NEGATIVE mg/dL   Protein, ur NEGATIVE  NEGATIVE mg/dL    Urobilinogen, UA 0.2  0.0 - 1.0 mg/dL   Nitrite NEGATIVE  NEGATIVE   Leukocytes, UA NEGATIVE  NEGATIVE  URINE MICROSCOPIC-ADD ON     Status: Abnormal   Collection Time    06/10/13  9:56 AM      Result Value Ref Range   Squamous Epithelial / LPF FEW (*) RARE   WBC, UA 0-2  <3 WBC/hpf   RBC / HPF 0-2  <3 RBC/hpf   Bacteria, UA RARE  RARE    IMAGING US Ob Comp Less 14 Wks  05/19/2013   CLINICAL DATA:  23 year old female with pain and positive pregnancy test. Uncertain LMP, estimated gestational age by LMP 8 weeks and 3 days. Initial encounter.  EXAM: OBSTETRIC <14 WK ULTRASOUND  TECHNIQUE: Transabdominal ultrasound was performed for evaluation of the gestation as well as the maternal uterus and adnexal regions.  COMPARISON:  None relevant.  FINDINGS: Intrauterine gestational sac: Single  Yolk sac:  Visible  Embryo:  Visible  Cardiac Activity: Detected  Heart Rate: 160 bpm  CRL:   1.8 cm  8 w 2 d                  [place] EDC: 12/27/2013  Maternal uterus/adnexae: No subchorionic hemorrhage or pelvic free fluid. Both ovaries appear normal with multiple follicles. The corpus luteum may be on the left.  IMPRESSION: Single living IUP demonstrated. No acute maternal findings visualized.   Electronically Signed   By: Lars Pinks M.D.   On: 05/19/2013 15:12    MAU COURSE UA shows no signs of infection Fetal heart tones detected on doppler HR of 152  ASSESSMENT and Plan Bleeding and Cramping 1. Spotting most likely due to intercourse with fiance 2. Normal Pregnancy 3. Discharge home 4. Keep pre natal appointments       Medication List         acetaminophen 500 MG tablet  Commonly known as:  TYLENOL  Take 500 mg by mouth every 4 (four) hours as needed for moderate pain.     calcium carbonate 500 MG chewable tablet  Commonly known as:  TUMS - dosed in mg elemental calcium  Chew 1 tablet by mouth daily as needed for indigestion or heartburn.     cephALEXin 500 MG capsule  Commonly known as:   KEFLEX  Take 1 capsule (500 mg total) by mouth 4 (four) times daily.     ondansetron 4 MG disintegrating tablet  Commonly known as:  ZOFRAN ODT  Take 1 tablet (4 mg total) by mouth every 6 (six) hours as needed for nausea.     prenatal multivitamin Tabs  tablet  Take 1 tablet by mouth daily at 12 noon.     promethazine 25 MG tablet  Commonly known as:  PHENERGAN  Take 0.5 tablets (12.5 mg total) by mouth every 6 (six) hours as needed for nausea or vomiting.     ranitidine 150 MG capsule  Commonly known as:  ZANTAC  Take 150 mg by mouth 2 (two) times daily as needed for heartburn.         Melvern Banker, Student-PA 06/10/2013  11:21 AM     Evaluation and management procedures were performed by PA-S under my supervision/collaboration. Chart reviewed, patient examined by me and I agree with management and plan. Addendum: S: vaginal spotting 1 d post coitus O: DT FHR 160, S=D, cx L/C, scant brownish d/c, no active bleeding, cx ectropion not friable on exam A: 1. Postcoital and contact bleeding   Hx PTB P: Reassured. No sex until PNV Follow-up Information   Follow up with [facility] On 06/14/2013.   Contact information:   [place] [address]

## 2013-06-10 NOTE — Discharge Instructions (Signed)
Vaginal Bleeding During Pregnancy  A small amount of bleeding from the vagina can happen anytime during pregnancy. It usually stops on its own. However, some bleeding can be serious. Be sure to tell your doctor about all vaginal bleeding.  HOME CARE   Get plenty of rest and sleep.   Stay in bed and only get up to go to the bathroom as told by your doctor.   Write down the number of pads you use each day. Note how soaked they are.   Do not use tampons. Do not clean the vagina with a stream of water (douche).   Do not have sex (intercourse) or put anything into your vagina. Have this approved by your doctor.   Save any tissue that comes from your vagina. Show it to your doctor.   Only take medicine as told by your doctor.   Follow your doctor's advice about lifting, driving, and physical activity.  GET HELP RIGHT AWAY IF:    You feel your baby move less or not at all.   You pass out (faint) while going to the bathroom.   You have more bleeding.   You start to have contractions.   You have severe cramps in your stomach, back, or belly (abdomen).   You are leaking fluid or have a gush of fluid from your vagina.   You become lightheaded or weak.   You have chills.   You have clumps of tissue or blood clots coming from your vagina.   You have a fever.  MAKE SURE YOU:    Understand these instructions.   Will watch your condition.   Will get help right away if you are not doing well or get worse.  Document Released: 11/06/2007 Document Revised: 01/14/2012 Document Reviewed: 11/17/2011  [employer] Patient Information 2014 [employer].

## 2013-06-10 NOTE — MAU Note (Signed)
Starting last night, saw pinkish spotting, started having cramping in lower abd cramping this morning. Spotting continues

## 2013-06-10 NOTE — MAU Provider Note (Signed)
Attestation of Attending Supervision of Advanced Practitioner (CNM/NP): Evaluation and management procedures were performed by the Advanced Practitioner under my supervision and collaboration.  I have reviewed the Advanced Practitioner's note and chart, and I agree with the management and plan.  Dnaiel Voller 06/10/2013 6:55 PM

## 2013-06-14 ENCOUNTER — Ambulatory Visit (INDEPENDENT_AMBULATORY_CARE_PROVIDER_SITE_OTHER): Payer: Medicaid Other | Admitting: Obstetrics & Gynecology

## 2013-06-14 ENCOUNTER — Encounter: Payer: Self-pay | Admitting: Obstetrics & Gynecology

## 2013-06-14 ENCOUNTER — Other Ambulatory Visit (HOSPITAL_COMMUNITY)
Admission: RE | Admit: 2013-06-14 | Discharge: 2013-06-14 | Disposition: A | Discharge status: Home / Self Care | Payer: Medicaid Other | Source: Ambulatory Visit | Attending: Obstetrics & Gynecology | Admitting: Obstetrics & Gynecology

## 2013-06-14 VITALS — BP 119/78 | HR 82 | Wt 114.1 lb

## 2013-06-14 DIAGNOSIS — Z3480 Encounter for supervision of other normal pregnancy, unspecified trimester: Secondary | ICD-10-CM

## 2013-06-14 DIAGNOSIS — Z3490 Encounter for supervision of normal pregnancy, unspecified, unspecified trimester: Secondary | ICD-10-CM

## 2013-06-14 DIAGNOSIS — Z01419 Encounter for gynecological examination (general) (routine) without abnormal findings: Secondary | ICD-10-CM | POA: Insufficient documentation

## 2013-06-14 LAB — POCT URINALYSIS DIP (DEVICE)
BILIRUBIN URINE: NEGATIVE
GLUCOSE, UA: NEGATIVE mg/dL
Hgb urine dipstick: NEGATIVE
KETONES UR: NEGATIVE mg/dL
Nitrite: NEGATIVE
Protein, ur: NEGATIVE mg/dL
SPECIFIC GRAVITY, URINE: 1.02 (ref 1.005–1.030)
Urobilinogen, UA: 0.2 mg/dL (ref 0.0–1.0)
pH: 5.5 (ref 5.0–8.0)

## 2013-06-14 MED ORDER — METRONIDAZOLE 500 MG PO TABS: 500.0000 mg | ORAL_TABLET | Freq: Two times a day (BID) | ORAL | Status: DC

## 2013-06-14 MED ORDER — ONDANSETRON HCL 8 MG PO TABS: 8.0000 mg | ORAL_TABLET | Freq: Three times a day (TID) | ORAL | Status: DC | PRN

## 2013-06-14 NOTE — Progress Notes (Signed)
Discussed recommended weight gain of 25-35 lb. Patient has very bad nausea and vomiting. Has Phenergan but it makes her sleepy. Would like something else if possible.

## 2013-06-14 NOTE — Progress Notes (Signed)
   Subjective:    Cindy Armstrong is a [id number] [redacted]w[redacted]d being seen today for her first obstetrical visit.  Her obstetrical history is significant for short interval between pregnancies. She had a h/o PPROM at 68 weeks. Patient does intend to breast feed. She breastfed her son for 2 month. Pregnancy history fully reviewed.  Patient reports nausea.  Filed Vitals:   06/14/13 0902  BP: 119/78  Pulse: 82  Weight: 114 lb 1.6 oz (51.755 kg)    HISTORY: OB History  Gravida Para Term Preterm AB SAB TAB Ectopic Multiple Living  2 1 0 1 0 0 0 0 0 1     # Outcome Date GA Lbr Len/2nd Weight Sex Delivery Anes PTL Lv  2 CUR           1 PRE 01/08/12 [redacted]w[redacted]d 04:43 / 00:21 4 lb 8.7 oz (2.06 kg) M SVD EPI  Y     Comments: preterm     Past Medical History  Diagnosis Date  . No pertinent past medical history   . Preterm labor    Past Surgical History  Procedure Laterality Date  . No past surgeries     Family History  Problem Relation Age of Onset  . Alcohol abuse Neg Hx   . Arthritis Neg Hx   . Asthma Neg Hx   . Birth defects Neg Hx   . Cancer Neg Hx   . COPD Neg Hx   . Depression Neg Hx   . Diabetes Neg Hx   . Drug abuse Neg Hx   . Early death Neg Hx   . Hearing loss Neg Hx   . Heart disease Neg Hx   . Hyperlipidemia Neg Hx   . Hypertension Neg Hx   . Kidney disease Neg Hx   . Learning disabilities Neg Hx   . Mental illness Neg Hx   . Mental retardation Neg Hx   . Miscarriages / Stillbirths Neg Hx   . Stroke Neg Hx   . Vision loss Neg Hx   . Other Neg Hx      Exam    Uterus:     Pelvic Exam:    Perineum: No Hemorrhoids   Vulva: normal   Vagina:  normal mucosa   pH:    Cervix: anteverted   Adnexa: normal adnexa   Bony Pelvis: android  System: Breast:  normal appearance, no masses or tenderness   Skin: normal coloration and turgor, no rashes    Neurologic: oriented   Extremities: normal strength, tone, and muscle mass   HEENT PERRLA   Mouth/Teeth mucous membranes  moist, pharynx normal without lesions   Neck supple   Cardiovascular: regular rate and rhythm   Respiratory:  appears well, vitals normal, no respiratory distress, acyanotic, normal RR, ear and throat exam is normal, neck free of mass or lymphadenopathy, chest clear, no wheezing, crepitations, rhonchi, normal symmetric air entry   Abdomen: soft, non-tender; bowel sounds normal; no masses,  no organomegaly   Urinary: urethral meatus normal      Assessment:    Pregnancy: G2P0101 There are no active problems to display for this patient.       Plan:     Initial labs drawn. Prenatal vitamins. Problem list reviewed and updated. Genetic Screening discussed Quad Screen: requested.  Ultrasound discussed; fetal survey: requested.  Follow up in 4 weeks.   Cindy Armstrong 06/14/2013

## 2013-06-15 LAB — OBSTETRIC PANEL
Antibody Screen: NEGATIVE
BASOS ABS: 0 10*3/uL (ref 0.0–0.1)
BASOS PCT: 0 % (ref 0–1)
Eosinophils Absolute: 0.1 10*3/uL (ref 0.0–0.7)
Eosinophils Relative: 1 % (ref 0–5)
HCT: 35.4 % — ABNORMAL LOW (ref 36.0–46.0)
Hemoglobin: 12.1 g/dL (ref 12.0–15.0)
Hepatitis B Surface Ag: NEGATIVE
Lymphocytes Relative: 23 % (ref 12–46)
Lymphs Abs: 1.9 10*3/uL (ref 0.7–4.0)
MCH: 28.9 pg (ref 26.0–34.0)
MCHC: 34.2 g/dL (ref 30.0–36.0)
MCV: 84.5 fL (ref 78.0–100.0)
Monocytes Absolute: 0.6 10*3/uL (ref 0.1–1.0)
Monocytes Relative: 7 % (ref 3–12)
Neutro Abs: 5.8 10*3/uL (ref 1.7–7.7)
Neutrophils Relative %: 69 % (ref 43–77)
PLATELETS: 273 10*3/uL (ref 150–400)
RBC: 4.19 MIL/uL (ref 3.87–5.11)
RDW: 16.8 % — ABNORMAL HIGH (ref 11.5–15.5)
Rh Type: POSITIVE
Rubella: 1.64 Index — ABNORMAL HIGH (ref <0.90)
WBC: 8.4 10*3/uL (ref 4.0–10.5)

## 2013-06-15 LAB — HIV ANTIBODY (ROUTINE TESTING W REFLEX): HIV 1&2 Ab, 4th Generation: NONREACTIVE

## 2013-06-16 LAB — HEMOGLOBINOPATHY EVALUATION
HGB A: 97.2 % (ref 96.8–97.8)
HGB S QUANTITAION: 0 %
Hemoglobin Other: 0 %
Hgb A2 Quant: 2.8 % (ref 2.2–3.2)
Hgb F Quant: 0 % (ref 0.0–2.0)

## 2013-06-16 LAB — CULTURE, OB URINE
Colony Count: NO GROWTH
Organism ID, Bacteria: NO GROWTH

## 2013-06-16 LAB — CANNABANOIDS (GC/LC/MS), URINE: THC-COOH UR CONFIRM: 420 ng/mL — AB (ref <5)

## 2013-06-17 LAB — PRESCRIPTION MONITORING PROFILE (19 PANEL)
AMPHETAMINE/METH: NEGATIVE ng/mL
BARBITURATE SCREEN, URINE: NEGATIVE ng/mL
Benzodiazepine Screen, Urine: NEGATIVE ng/mL
Buprenorphine, Urine: NEGATIVE ng/mL
Carisoprodol, Urine: NEGATIVE ng/mL
Cocaine Metabolites: NEGATIVE ng/mL
Creatinine, Urine: 211.27 mg/dL (ref >20.0)
Fentanyl, Ur: NEGATIVE ng/mL
MDMA URINE: NEGATIVE ng/mL
Meperidine, Ur: NEGATIVE ng/mL
Methadone Screen, Urine: NEGATIVE ng/mL
Methaqualone: NEGATIVE ng/mL
Nitrites, Initial: NEGATIVE ug/mL
OPIATE SCREEN, URINE: NEGATIVE ng/mL
OXYCODONE SCRN UR: NEGATIVE ng/mL
Phencyclidine, Ur: NEGATIVE ng/mL
Propoxyphene: NEGATIVE ng/mL
TAPENTADOLUR: NEGATIVE ng/mL
Tramadol Scrn, Ur: NEGATIVE ng/mL
Zolpidem, Urine: NEGATIVE ng/mL
pH, Initial: 6.2 pH (ref 4.5–8.9)

## 2013-07-12 ENCOUNTER — Encounter (HOSPITAL_COMMUNITY): Payer: Self-pay | Admitting: *Deleted

## 2013-07-12 ENCOUNTER — Inpatient Hospital Stay (HOSPITAL_COMMUNITY)
Admission: AD | Admit: 2013-07-12 | Discharge: 2013-07-12 | Disposition: A | Discharge status: Home / Self Care | Payer: Medicaid Other | Source: Ambulatory Visit | Attending: Obstetrics & Gynecology | Admitting: Obstetrics & Gynecology

## 2013-07-12 ENCOUNTER — Encounter: Payer: Medicaid Other | Admitting: Advanced Practice Midwife

## 2013-07-12 DIAGNOSIS — O210 Mild hyperemesis gravidarum: Secondary | ICD-10-CM | POA: Insufficient documentation

## 2013-07-12 DIAGNOSIS — J029 Acute pharyngitis, unspecified: Secondary | ICD-10-CM | POA: Insufficient documentation

## 2013-07-12 DIAGNOSIS — B9789 Other viral agents as the cause of diseases classified elsewhere: Secondary | ICD-10-CM

## 2013-07-12 DIAGNOSIS — R6883 Chills (without fever): Secondary | ICD-10-CM | POA: Insufficient documentation

## 2013-07-12 DIAGNOSIS — O99891 Other specified diseases and conditions complicating pregnancy: Secondary | ICD-10-CM | POA: Insufficient documentation

## 2013-07-12 DIAGNOSIS — R52 Pain, unspecified: Secondary | ICD-10-CM | POA: Insufficient documentation

## 2013-07-12 DIAGNOSIS — K5900 Constipation, unspecified: Secondary | ICD-10-CM

## 2013-07-12 DIAGNOSIS — O9989 Other specified diseases and conditions complicating pregnancy, childbirth and the puerperium: Principal | ICD-10-CM

## 2013-07-12 DIAGNOSIS — B349 Viral infection, unspecified: Secondary | ICD-10-CM

## 2013-07-12 LAB — URINALYSIS, ROUTINE W REFLEX MICROSCOPIC
Bilirubin Urine: NEGATIVE
Glucose, UA: NEGATIVE mg/dL
Ketones, ur: 40 mg/dL — AB
NITRITE: NEGATIVE
PH: 7 (ref 5.0–8.0)
Protein, ur: NEGATIVE mg/dL
SPECIFIC GRAVITY, URINE: 1.015 (ref 1.005–1.030)
Urobilinogen, UA: 0.2 mg/dL (ref 0.0–1.0)

## 2013-07-12 LAB — URINE MICROSCOPIC-ADD ON

## 2013-07-12 MED ORDER — ACETAMINOPHEN 500 MG PO TABS
1000.0000 mg | ORAL_TABLET | Freq: Once | ORAL | Status: AC
  Administered 2013-07-12: 1000 mg via ORAL
  Filled 2013-07-12: qty 2

## 2013-07-12 MED ORDER — BISACODYL 10 MG RE SUPP: 10.0000 mg | RECTAL | Status: DC | PRN

## 2013-07-12 MED ORDER — PROMETHAZINE HCL 25 MG PO TABS
12.5000 mg | ORAL_TABLET | Freq: Once | ORAL | Status: AC
  Administered 2013-07-12: 12.5 mg via ORAL
  Filled 2013-07-12: qty 1

## 2013-07-12 MED ORDER — BISACODYL 10 MG RE SUPP
10.0000 mg | Freq: Once | RECTAL | Status: AC
  Administered 2013-07-12: 10 mg via RECTAL
  Filled 2013-07-12: qty 1

## 2013-07-12 MED ORDER — PROMETHAZINE HCL 25 MG PO TABS: 12.5000 mg | ORAL_TABLET | Freq: Four times a day (QID) | ORAL | Status: DC | PRN

## 2013-07-12 MED ORDER — ACETAMINOPHEN 120 MG RE SUPP: 120.0000 mg | RECTAL | Status: DC | PRN

## 2013-07-12 NOTE — Discharge Instructions (Signed)

## 2013-07-12 NOTE — MAU Note (Signed)
Urine in lab 

## 2013-07-12 NOTE — MAU Provider Note (Signed)
Chief Complaint:  No chief complaint on file.   Cindy Armstrong is a 23 y.o.  G2P0101 with IUP at [redacted]w[redacted]d presenting for body aches, chills, congestion, sore throat, and constipation.   Has been sick since Friday Was constipated prior to that Since Friday had sharp pain in her left side that has then progressed body aches throughout Since yesterday has had diffuse body aches.  All symptoms have been for the last 3 days.   +congestion, sore throat + chills and subjective fevers +dysuria + nausea and vomiting and unable to keep much down  Son has similar symptoms.  Was diagnosed with a virus and is now getting better.   Has been taking tylenol for the aches and pains but doesn't seem to help. Hasn't taken it since 2 days ago. Not taking any other medicine.    Menstrual History: OB History   Grav Para Term Preterm Abortions TAB SAB Ect Mult Living   2 1 0 1 0 0 0 0 0 1        Patient's last menstrual period was 03/21/2013.      Past Medical History  Diagnosis Date  . No pertinent past medical history   . Preterm labor     Past Surgical History  Procedure Laterality Date  . No past surgeries      Family History  Problem Relation Age of Onset  . Alcohol abuse Neg Hx   . Arthritis Neg Hx   . Asthma Neg Hx   . Birth defects Neg Hx   . Cancer Neg Hx   . COPD Neg Hx   . Depression Neg Hx   . Diabetes Neg Hx   . Drug abuse Neg Hx   . Early death Neg Hx   . Hearing loss Neg Hx   . Heart disease Neg Hx   . Hyperlipidemia Neg Hx   . Hypertension Neg Hx   . Kidney disease Neg Hx   . Learning disabilities Neg Hx   . Mental illness Neg Hx   . Mental retardation Neg Hx   . Miscarriages / Stillbirths Neg Hx   . Stroke Neg Hx   . Vision loss Neg Hx   . Other Neg Hx     History  Substance Use Topics  . Smoking status: Never Smoker   . Smokeless tobacco: Never Used  . Alcohol Use: No     No Known Allergies  Prescriptions prior to admission  Medication Sig  Dispense Refill  . acetaminophen (TYLENOL) 500 MG tablet Take 500 mg by mouth every 4 (four) hours as needed for moderate pain.      . bisacodyl (DULCOLAX) 5 MG EC tablet Take 5 mg by mouth daily as needed for moderate constipation.      . ondansetron (ZOFRAN ODT) 4 MG disintegrating tablet Take 1 tablet (4 mg total) by mouth every 6 (six) hours as needed for nausea.  20 tablet  0  . ondansetron (ZOFRAN) 8 MG tablet Take 1 tablet (8 mg total) by mouth every 8 (eight) hours as needed for nausea or vomiting.  20 tablet  0  . Prenatal Vit-Fe Fumarate-FA (PRENATAL MULTIVITAMIN) TABS tablet Take 1 tablet by mouth daily at 12 noon.      . promethazine (PHENERGAN) 25 MG tablet Take 0.5 tablets (12.5 mg total) by mouth every 6 (six) hours as needed for nausea or vomiting.  20 tablet  0  . ranitidine (ZANTAC) 150 MG capsule Take 150 mg by mouth 2 (  two) times daily as needed for heartburn.        Review of Systems - Negative except for what is mentioned in HPI.  Physical Exam  Blood pressure 114/68, pulse 102, temperature 99.4 F (37.4 C), temperature source Oral, resp. rate 22, height 5\' 2"  (1.575 m), weight 51.71 kg (114 lb), last menstrual period 03/21/2013, SpO2 100.00%, not currently breastfeeding. GENERAL: appears to not feel well. No acute distress.  LUNGS: Clear to auscultation bilaterally.  HEART: Regular rate and rhythm. ABDOMEN: Soft, nontender, nondistended, gravid. ?fullness in LLQ.   EXTREMITIES: Nontender, no edema, 2+ distal pulses.  FHT:  169 on doppler     Labs: Results for orders placed during the hospital encounter of 07/12/13 (from the past 24 hour(s))  URINALYSIS, ROUTINE W REFLEX MICROSCOPIC   Collection Time    07/12/13  5:00 PM      Result Value Ref Range   Color, Urine YELLOW  YELLOW   APPearance CLEAR  CLEAR   Specific Gravity, Urine 1.015  1.005 - 1.030   pH 7.0  5.0 - 8.0   Glucose, UA NEGATIVE  NEGATIVE mg/dL   Hgb urine dipstick TRACE (*) NEGATIVE   Bilirubin  Urine NEGATIVE  NEGATIVE   Ketones, ur 40 (*) NEGATIVE mg/dL   Protein, ur NEGATIVE  NEGATIVE mg/dL   Urobilinogen, UA 0.2  0.0 - 1.0 mg/dL   Nitrite NEGATIVE  NEGATIVE   Leukocytes, UA TRACE (*) NEGATIVE  URINE MICROSCOPIC-ADD ON   Collection Time    07/12/13  5:00 PM      Result Value Ref Range   Squamous Epithelial / LPF FEW (*) RARE   WBC, UA 11-20  <3 WBC/hpf   RBC / HPF 0-2  <3 RBC/hpf   Bacteria, UA FEW (*) RARE    Imaging Studies:  No results found.  Assessment: Cindy Armstrong is  23 y.o. G2P0101 at [redacted]w[redacted]d presents with  body aches, chills, congestion, sore throat, and constipation.    Plan: 1) body aches, chills,  - likely viral syndrome. - given phenergan here and able to po hydrate - moist membranes on exam and normal spec gravity on urine making me defer iv fluids at this time - aches improved with tylenol - discussed home measures including atc tylenol and rx for suppositiories if needed - cont slow rehydration  2) constipation - appears to be chronic - rx of dulcolax suppositories  - discussed reasons to return.   F/u here if no improvement in the next 48 hours.  Discharge instructions given to both mom and pt and they agree with above.       Varvara Legault L Casmere Hollenbeck 6/2/20156:15 PM

## 2013-07-12 NOTE — MAU Note (Signed)
Been really stopped up since Friday.  Hurt all over. Sore throat, +cough- productive:yellow mucous.  Fever/chills- has not checked fever.  Hasn't been able to eat, no appetite.  Son was sick first- his fever went up to 103.

## 2013-07-15 ENCOUNTER — Encounter (HOSPITAL_COMMUNITY): Payer: Self-pay | Admitting: *Deleted

## 2013-07-15 ENCOUNTER — Inpatient Hospital Stay (HOSPITAL_COMMUNITY)
Admission: AD | Admit: 2013-07-15 | Discharge: 2013-07-19 | DRG: 781 | Disposition: A | Discharge status: Home / Self Care | Payer: Medicaid Other | Source: Ambulatory Visit | Attending: Obstetrics and Gynecology | Admitting: Obstetrics and Gynecology

## 2013-07-15 DIAGNOSIS — O2302 Infections of kidney in pregnancy, second trimester: Secondary | ICD-10-CM | POA: Diagnosis present

## 2013-07-15 DIAGNOSIS — N12 Tubulo-interstitial nephritis, not specified as acute or chronic: Secondary | ICD-10-CM | POA: Diagnosis present

## 2013-07-15 DIAGNOSIS — O99330 Smoking (tobacco) complicating pregnancy, unspecified trimester: Secondary | ICD-10-CM | POA: Diagnosis present

## 2013-07-15 DIAGNOSIS — O2390 Unspecified genitourinary tract infection in pregnancy, unspecified trimester: Principal | ICD-10-CM | POA: Diagnosis present

## 2013-07-15 LAB — URINALYSIS, ROUTINE W REFLEX MICROSCOPIC
GLUCOSE, UA: NEGATIVE mg/dL
Ketones, ur: 40 mg/dL — AB
Nitrite: POSITIVE — AB
Protein, ur: 30 mg/dL — AB
SPECIFIC GRAVITY, URINE: 1.02 (ref 1.005–1.030)
Urobilinogen, UA: 1 mg/dL (ref 0.0–1.0)
pH: 6 (ref 5.0–8.0)

## 2013-07-15 LAB — CBC
HCT: 34.3 % — ABNORMAL LOW (ref 36.0–46.0)
Hemoglobin: 12 g/dL (ref 12.0–15.0)
MCH: 29.6 pg (ref 26.0–34.0)
MCHC: 35 g/dL (ref 30.0–36.0)
MCV: 84.7 fL (ref 78.0–100.0)
PLATELETS: 210 10*3/uL (ref 150–400)
RBC: 4.05 MIL/uL (ref 3.87–5.11)
RDW: 15.2 % (ref 11.5–15.5)
WBC: 13.3 10*3/uL — AB (ref 4.0–10.5)

## 2013-07-15 LAB — COMPREHENSIVE METABOLIC PANEL
ALBUMIN: 3.2 g/dL — AB (ref 3.5–5.2)
ALT: 6 U/L (ref 0–35)
AST: 10 U/L (ref 0–37)
Alkaline Phosphatase: 79 U/L (ref 39–117)
BILIRUBIN TOTAL: 0.4 mg/dL (ref 0.3–1.2)
BUN: 10 mg/dL (ref 6–23)
CHLORIDE: 97 meq/L (ref 96–112)
CO2: 22 mEq/L (ref 19–32)
CREATININE: 0.76 mg/dL (ref 0.50–1.10)
Calcium: 9.5 mg/dL (ref 8.4–10.5)
GFR calc Af Amer: >90 mL/min (ref >90)
GFR calc non Af Amer: >90 mL/min (ref >90)
Glucose, Bld: 87 mg/dL (ref 70–99)
POTASSIUM: 4.2 meq/L (ref 3.7–5.3)
SODIUM: 134 meq/L — AB (ref 137–147)
Total Protein: 7.3 g/dL (ref 6.0–8.3)

## 2013-07-15 LAB — URINE MICROSCOPIC-ADD ON

## 2013-07-15 MED ORDER — SODIUM CHLORIDE 0.9 % IV SOLN
INTRAVENOUS | Status: DC
  Administered 2013-07-15 – 2013-07-18 (×9): via INTRAVENOUS

## 2013-07-15 MED ORDER — OXYCODONE-ACETAMINOPHEN 5-325 MG PO TABS
1.0000 | ORAL_TABLET | Freq: Once | ORAL | Status: AC
  Administered 2013-07-15: 1 via ORAL
  Filled 2013-07-15: qty 1

## 2013-07-15 MED ORDER — PRENATAL MULTIVITAMIN CH
1.0000 | ORAL_TABLET | Freq: Every day | ORAL | Status: DC
  Administered 2013-07-16 – 2013-07-18 (×3): 1 via ORAL
  Filled 2013-07-15 (×3): qty 1

## 2013-07-15 MED ORDER — DEXTROSE 5 % IV SOLN
1.0000 g | Freq: Two times a day (BID) | INTRAVENOUS | Status: DC
  Administered 2013-07-15 – 2013-07-18 (×7): 1 g via INTRAVENOUS
  Filled 2013-07-15 (×8): qty 10

## 2013-07-15 MED ORDER — CALCIUM CARBONATE ANTACID 500 MG PO CHEW: 2.0000 | CHEWABLE_TABLET | ORAL | Status: DC | PRN

## 2013-07-15 MED ORDER — PROMETHAZINE HCL 25 MG PO TABS
12.5000 mg | ORAL_TABLET | Freq: Once | ORAL | Status: AC
  Administered 2013-07-15: 12.5 mg via ORAL
  Filled 2013-07-15: qty 1

## 2013-07-15 MED ORDER — ACETAMINOPHEN 325 MG PO TABS: 650.0000 mg | ORAL_TABLET | ORAL | Status: DC | PRN

## 2013-07-15 MED ORDER — ZOLPIDEM TARTRATE 5 MG PO TABS: 5.0000 mg | ORAL_TABLET | Freq: Every evening | ORAL | Status: DC | PRN

## 2013-07-15 NOTE — MAU Provider Note (Signed)
History     CSN: [id number]  Arrival date and time: 07/15/13 1542   First Provider Initiated Contact with Patient 07/15/13 1656         Chief Complaint  Patient presents with  . Emesis During Pregnancy  . Generalized Body Aches  . Dysuria  . Cough   HPI Cindy Armstrong is a 23 y.o. G2P0101 at [redacted]w[redacted]d who presents for body aches, dysuria, & vomiting.  Pt was seen in MAU on 6/2 for URI & constipation. States cough has improved & has had 2 small bowel movements since taking dulcolax that was prescribed during that visit.  Pt presents today with generalized body aches that are worse in right flank area; started last week. Rates 8/10 on pain scale & unrelieved by tylenol. No aggravating factors. Nausea & vomiting during pregnancy; states relieved by zofran but ran out of zofran prescription. States has vomited 5 times since yesterday. Emesis triggered by eating & drinking. Noted blood streaks in vomit starting today. Denies hematemesis.     Past Medical History  Diagnosis Date  . No pertinent past medical history   . Preterm labor     Past Surgical History  Procedure Laterality Date  . No past surgeries      Family History  Problem Relation Age of Onset  . Alcohol abuse Neg Hx   . Arthritis Neg Hx   . Asthma Neg Hx   . Birth defects Neg Hx   . Cancer Neg Hx   . COPD Neg Hx   . Depression Neg Hx   . Diabetes Neg Hx   . Drug abuse Neg Hx   . Early death Neg Hx   . Hearing loss Neg Hx   . Heart disease Neg Hx   . Hyperlipidemia Neg Hx   . Hypertension Neg Hx   . Kidney disease Neg Hx   . Learning disabilities Neg Hx   . Mental illness Neg Hx   . Mental retardation Neg Hx   . Miscarriages / Stillbirths Neg Hx   . Stroke Neg Hx   . Vision loss Neg Hx   . Other Neg Hx     History  Substance Use Topics  . Smoking status: Current Every Day Smoker -- 0.25 packs/day    Types: Cigarettes  . Smokeless tobacco: Never Used  . Alcohol Use: No    Allergies: No Known  Allergies  Prescriptions prior to admission  Medication Sig Dispense Refill  . acetaminophen (TYLENOL) 500 MG tablet Take 500 mg by mouth every 4 (four) hours as needed for moderate pain.      Marland Kitchen ondansetron (ZOFRAN) 8 MG tablet Take 8 mg by mouth every 8 (eight) hours as needed for nausea or vomiting.        Review of Systems  Constitutional: Positive for fever and weight loss (states lost 4# since last visit). Negative for chills.  HENT: Negative for congestion, nosebleeds and sore throat.   Respiratory: Positive for cough and hemoptysis (blood tinged mucous with cough x 2 days. ). Negative for shortness of breath.   Cardiovascular: Negative.   Gastrointestinal: Positive for nausea, vomiting, abdominal pain and constipation. Negative for heartburn, diarrhea and blood in stool.  Genitourinary: Positive for dysuria and flank pain. Negative for urgency, frequency and hematuria.       Negative for vaginal bleeding or vaginal discharge.   Musculoskeletal: Positive for myalgias. Negative for falls.   Physical Exam   Blood pressure 109/66, pulse 101, temperature 99.1  F (37.3 C), temperature source Oral, resp. rate 16, height 5\' 3"  (1.6 m), weight 109 lb (49.442 kg), last menstrual period 03/21/2013, not currently breastfeeding.  Physical Exam  Nursing note and vitals reviewed. Constitutional: She is oriented to person, place, and time. She appears well-developed and well-nourished. No distress.  Cardiovascular: Normal rate, regular rhythm and normal heart sounds.   Respiratory: Effort normal and breath sounds normal. No respiratory distress. She has no wheezes.  GI: Soft. Bowel sounds are normal. She exhibits no distension and no mass. There is generalized tenderness. There is CVA tenderness. There is no rebound and no guarding.  Neurological: She is alert and oriented to person, place, and time.  Skin: Skin is warm and dry. She is not diaphoretic. No erythema.  Psychiatric: She has a  normal mood and affect. Her behavior is normal. Judgment and thought content normal.    MAU Course  Procedures Results for orders placed during the hospital encounter of 07/15/13 (from the past 24 hour(s))  URINALYSIS, ROUTINE W REFLEX MICROSCOPIC     Status: Abnormal   Collection Time    07/15/13  4:31 PM      Result Value Ref Range   Color, Urine YELLOW  YELLOW   APPearance HAZY (*) CLEAR   Specific Gravity, Urine 1.020  1.005 - 1.030   pH 6.0  5.0 - 8.0   Glucose, UA NEGATIVE  NEGATIVE mg/dL   Hgb urine dipstick TRACE (*) NEGATIVE   Bilirubin Urine SMALL (*) NEGATIVE   Ketones, ur 40 (*) NEGATIVE mg/dL   Protein, ur 30 (*) NEGATIVE mg/dL   Urobilinogen, UA 1.0  0.0 - 1.0 mg/dL   Nitrite POSITIVE (*) NEGATIVE   Leukocytes, UA TRACE (*) NEGATIVE  URINE MICROSCOPIC-ADD ON     Status: Abnormal   Collection Time    07/15/13  4:31 PM      Result Value Ref Range   Squamous Epithelial / LPF FEW (*) RARE   WBC, UA 11-20  <3 WBC/hpf   RBC / HPF 0-2  <3 RBC/hpf   Bacteria, UA MANY (*) RARE  CBC     Status: Abnormal   Collection Time    07/15/13  5:35 PM      Result Value Ref Range   WBC 13.3 (*) 4.0 - 10.5 K/uL   RBC 4.05  3.87 - 5.11 MIL/uL   Hemoglobin 12.0  12.0 - 15.0 g/dL   HCT 34.3 (*) 36.0 - 46.0 %   MCV 84.7  78.0 - 100.0 fL   MCH 29.6  26.0 - 34.0 pg   MCHC 35.0  30.0 - 36.0 g/dL   RDW 15.2  11.5 - 15.5 %   Platelets 210  150 - 400 K/uL  COMPREHENSIVE METABOLIC PANEL     Status: Abnormal   Collection Time    07/15/13  5:35 PM      Result Value Ref Range   Sodium 134 (*) 137 - 147 mEq/L   Potassium 4.2  3.7 - 5.3 mEq/L   Chloride 97  96 - 112 mEq/L   CO2 22  19 - 32 mEq/L   Glucose, Bld 87  70 - 99 mg/dL   BUN 10  6 - 23 mg/dL   Creatinine, Ser 0.76  0.50 - 1.10 mg/dL   Calcium 9.5  8.4 - 10.5 mg/dL   Total Protein 7.3  6.0 - 8.3 g/dL   Albumin 3.2 (*) 3.5 - 5.2 g/dL   AST 10  0 - 37  U/L   ALT 6  0 - 35 U/L   Alkaline Phosphatase 79  39 - 117 U/L   Total  Bilirubin 0.4  0.3 - 1.2 mg/dL   GFR calc non Af Amer >90  >90 mL/min   GFR calc Af Amer >90  >90 mL/min    MDM FHT 156 by doppler CBC, CMP Phenergan & percocet  - pain improved, pt has not vomited while in MAU 1858: C/W Dr. Glo Herring will admit to antenatal for IV abx  Assessment and Plan  A:  1. Pyelonephritis complicating pregnancy in second trimester, antepartum      P: Admit to antenatal  IV Rocephin 1g q [place] 07/15/2013, 6:15 PM

## 2013-07-15 NOTE — MAU Note (Signed)
Patient states she was seen in MAU 3 days ago with the same symptoms as today but is not getting better. Has nausea with vomiting for 5 days, not keeping anything down. Now seeing blood in vomit. Has generalized body aches, no sore throat or fever. Denies bleeding, has slight abdominal pain.

## 2013-07-15 NOTE — MAU Note (Signed)
Pt states she needs Prenatal vitamins.  She states Zofran works the best for her but ran out last night without any refills.

## 2013-07-16 DIAGNOSIS — O2390 Unspecified genitourinary tract infection in pregnancy, unspecified trimester: Secondary | ICD-10-CM

## 2013-07-16 DIAGNOSIS — N12 Tubulo-interstitial nephritis, not specified as acute or chronic: Secondary | ICD-10-CM

## 2013-07-16 DIAGNOSIS — O99330 Smoking (tobacco) complicating pregnancy, unspecified trimester: Secondary | ICD-10-CM

## 2013-07-16 MED ORDER — OXYCODONE-ACETAMINOPHEN 5-325 MG PO TABS
1.0000 | ORAL_TABLET | Freq: Four times a day (QID) | ORAL | Status: DC | PRN
  Administered 2013-07-16: 1 via ORAL
  Administered 2013-07-16 – 2013-07-17 (×3): 2 via ORAL
  Administered 2013-07-17: 1 via ORAL
  Administered 2013-07-18 (×2): 2 via ORAL
  Filled 2013-07-16: qty 1
  Filled 2013-07-16 (×2): qty 2
  Filled 2013-07-16: qty 1
  Filled 2013-07-16 (×3): qty 2

## 2013-07-16 MED ORDER — PROMETHAZINE HCL 25 MG PO TABS
25.0000 mg | ORAL_TABLET | Freq: Four times a day (QID) | ORAL | Status: DC | PRN
  Administered 2013-07-16 – 2013-07-18 (×6): 25 mg via ORAL
  Filled 2013-07-16 (×6): qty 1

## 2013-07-16 MED ORDER — HYDROXYPROGESTERONE CAPROATE 250 MG/ML IM OIL
250.0000 mg | TOPICAL_OIL | INTRAMUSCULAR | Status: DC
  Administered 2013-07-16: 250 mg via INTRAMUSCULAR
  Filled 2013-07-16: qty 1

## 2013-07-16 NOTE — Progress Notes (Signed)
Patient ID: Cindy Armstrong, female   DOB: Jan 22, 1991, 22 y.o.   MRN: [id number] [place]) NOTE  Cindy Armstrong is a 23 y.o. G2P0101 at [redacted]w[redacted]d  who is admitted for pyelonephritis.   Length of Stay:  1  Days  Subjective: Patient has right sided back pain, slightly better than upon admission Patient reports the fetal movement as active. Patient reports uterine contraction  activity as none. Patient reports  vaginal bleeding as none. Patient describes fluid per vagina as None.  Vitals:  Blood pressure 108/53, pulse 91, temperature 98 F (36.7 C), temperature source Oral, resp. rate 18, height 5\' 3"  (1.6 m), weight 109 lb (49.442 kg), last menstrual period 03/21/2013, not currently breastfeeding. Physical Examination:  General appearance - alert, well appearing, and in no distress Abdomen - soft, non tneder Back - right CVA tenderness Extremities - no edema, redness or tenderness in the calves or thighs, Homan's sign negative bilaterally  Fetal Monitoring: FH check per day by RN--see her documentation  Labs:  Results for orders placed during the hospital encounter of 07/15/13 (from the past 24 hour(s))  URINALYSIS, ROUTINE W REFLEX MICROSCOPIC   Collection Time    07/15/13  4:31 PM      Result Value Ref Range   Color, Urine YELLOW  YELLOW   APPearance HAZY (*) CLEAR   Specific Gravity, Urine 1.020  1.005 - 1.030   pH 6.0  5.0 - 8.0   Glucose, UA NEGATIVE  NEGATIVE mg/dL   Hgb urine dipstick TRACE (*) NEGATIVE   Bilirubin Urine SMALL (*) NEGATIVE   Ketones, ur 40 (*) NEGATIVE mg/dL   Protein, ur 30 (*) NEGATIVE mg/dL   Urobilinogen, UA 1.0  0.0 - 1.0 mg/dL   Nitrite POSITIVE (*) NEGATIVE   Leukocytes, UA TRACE (*) NEGATIVE  URINE MICROSCOPIC-ADD ON   Collection Time    07/15/13  4:31 PM      Result Value Ref Range   Squamous Epithelial / LPF FEW (*) RARE   WBC, UA 11-20  <3 WBC/hpf   RBC / HPF 0-2  <3 RBC/hpf   Bacteria, UA MANY (*) RARE   CBC   Collection Time    07/15/13  5:35 PM      Result Value Ref Range   WBC 13.3 (*) 4.0 - 10.5 K/uL   RBC 4.05  3.87 - 5.11 MIL/uL   Hemoglobin 12.0  12.0 - 15.0 g/dL   HCT 34.3 (*) 36.0 - 46.0 %   MCV 84.7  78.0 - 100.0 fL   MCH 29.6  26.0 - 34.0 pg   MCHC 35.0  30.0 - 36.0 g/dL   RDW 15.2  11.5 - 15.5 %   Platelets 210  150 - 400 K/uL  COMPREHENSIVE METABOLIC PANEL   Collection Time    07/15/13  5:35 PM      Result Value Ref Range   Sodium 134 (*) 137 - 147 mEq/L   Potassium 4.2  3.7 - 5.3 mEq/L   Chloride 97  96 - 112 mEq/L   CO2 22  19 - 32 mEq/L   Glucose, Bld 87  70 - 99 mg/dL   BUN 10  6 - 23 mg/dL   Creatinine, Ser 0.76  0.50 - 1.10 mg/dL   Calcium 9.5  8.4 - 10.5 mg/dL   Total Protein 7.3  6.0 - 8.3 g/dL   Albumin 3.2 (*) 3.5 - 5.2 g/dL   AST 10  0 - 37 U/L  ALT 6  0 - 35 U/L   Alkaline Phosphatase 79  39 - 117 U/L   Total Bilirubin 0.4  0.3 - 1.2 mg/dL   GFR calc non Af Amer >90  >90 mL/min   GFR calc Af Amer >90  >90 mL/min    Medications:  Scheduled . cefTRIAXone (ROCEPHIN)  IV  1 g Intravenous Q12H  . hydroxyprogesterone caproate  250 mg Intramuscular Weekly  . prenatal multivitamin  1 tablet Oral Q1200   I have reviewed the patient's current medications.  ASSESSMENT: Patient Active Problem List   Diagnosis Date Noted  . Pyelonephritis complicating pregnancy in second trimester, antepartum 07/15/2013    PLAN: Cindy Armstrong is a 23 y.o. G2P0101 at [redacted]w[redacted]d  who is admitted for pyelonephritis.   1-Continue Rocephin bid 2-Pt needs to start [place]. 3-Pain mgmt as needed.   Fredderick Phenix Thelonious Kauffmann 07/16/2013,9:42 AM

## 2013-07-17 DIAGNOSIS — O2390 Unspecified genitourinary tract infection in pregnancy, unspecified trimester: Principal | ICD-10-CM

## 2013-07-17 DIAGNOSIS — N12 Tubulo-interstitial nephritis, not specified as acute or chronic: Secondary | ICD-10-CM

## 2013-07-17 LAB — URINE CULTURE

## 2013-07-17 NOTE — Progress Notes (Signed)
Patient ID: Cindy Armstrong, female   DOB: December 06, 1990, 23 y.o.   MRN: [id number] [place]) NOTE  Cindy Armstrong is a 23 y.o. G2P0101 at [redacted]w[redacted]d  who is admitted for pyelonephritis.   Length of Stay:  2  Days  Subjective: Back pain is improved. Patient reports the fetal movement as not feeling yet. Patient reports uterine contraction  activity as none. Patient reports  vaginal bleeding as none. Patient describes fluid per vagina as None.  Vitals:  Blood pressure 102/50, pulse 86, temperature 98.3 F (36.8 C), temperature source Oral, resp. rate 18, height 5\' 3"  (1.6 m), weight 109 lb (49.442 kg), last menstrual period 03/21/2013, not currently breastfeeding. Physical Examination:  General appearance - alert, well appearing, and in no distress Abdomen - soft, nontender, nondistended, no masses or organomegaly Extremities - peripheral pulses normal, no pedal edema, no clubbing or cyanosis Back - decreased CVA tenderness on the right  Fetal Monitoring:  +FHT per RN daily.  Labs:  No results found for this or any previous visit (from the past 24 hour(s)).   Medications:  Scheduled . cefTRIAXone (ROCEPHIN)  IV  1 g Intravenous Q12H  . hydroxyprogesterone caproate  250 mg Intramuscular Weekly  . prenatal multivitamin  1 tablet Oral Q1200   I have reviewed the patient's current medications.  ASSESSMENT: Patient Active Problem List   Diagnosis Date Noted  . Pyelonephritis complicating pregnancy in second trimester, antepartum 07/15/2013    PLAN: --Continue Rocephin until back pain in resolved --Received 17P yesterday --Continue care.  Fredderick Phenix Keara Pagliarulo 07/17/2013,12:01 PM

## 2013-07-18 NOTE — Progress Notes (Signed)
Ur chart review completed.  

## 2013-07-18 NOTE — Progress Notes (Signed)
Patient ID: Cindy Armstrong, female   DOB: 12-28-1990, 23 y.o.   MRN: [id number] Patient ID: Cindy Armstrong, female   DOB: 01-Nov-1990, 23 y.o.   MRN: [id number] [name]  Cindy Armstrong is a 23 y.o. G2P0101 at [redacted]w[redacted]d  who is admitted for pyelonephritis.   Length of Stay:  3  Days  Subjective: Back pain is improved but still present Patient reports the fetal movement as not feeling yet. Patient reports uterine contraction  activity as none. Patient reports  vaginal bleeding as none. Patient describes fluid per vagina as None.  Vitals:  Blood pressure 109/53, pulse 81, temperature 97.9 F (36.6 C), temperature source Oral, resp. rate 20, height 5\' 3"  (1.6 m), weight 109 lb (49.442 kg), last menstrual period 03/21/2013, not currently breastfeeding. Physical Examination:  General appearance - alert, well appearing, and in no distress Abdomen - soft, nontender, nondistended, no masses or organomegaly Extremities - peripheral pulses normal, no pedal edema, no clubbing or cyanosis Back - decreased CVA tenderness on the right  Fetal Monitoring:  +FHT per RN daily.  Labs:  No results found for this or any previous visit (from the past 24 hour(s)).   Medications:  Scheduled . cefTRIAXone (ROCEPHIN)  IV  1 g Intravenous Q12H  . hydroxyprogesterone caproate  250 mg Intramuscular Weekly  . prenatal multivitamin  1 tablet Oral Q1200   I have reviewed the patient's current medications.  ASSESSMENT: Patient Active Problem List   Diagnosis Date Noted  . Pyelonephritis complicating pregnancy in second trimester, antepartum 07/15/2013  [redacted]w[redacted]d   PLAN: --Continue Rocephin until back pain in resolved probably 24 more hours --Received 17P  --Continue care.  Cashawn Yanko H Javyn Havlin 07/18/2013,7:53 AM

## 2013-07-18 NOTE — MAU Provider Note (Signed)
`````  Attestation of Attending Supervision of Advanced Practitioner: Evaluation and management procedures were performed by the PA/NP/CNM/OB Fellow under my supervision/collaboration. Chart reviewed and agree with management and plan. Patient examined, and found to have 5/10 cvat. Pt has a fragile, fatigued demeanor, ; I will admit due to sx, even tho pt is afebrile.  Mallory Shirk V 07/18/2013 11:34 PM

## 2013-07-19 MED ORDER — PRENATAL MULTIVITAMIN CH: 1.0000 | ORAL_TABLET | Freq: Every day | ORAL | Status: DC

## 2013-07-19 MED ORDER — OXYCODONE-ACETAMINOPHEN 5-325 MG PO TABS: 1.0000 | ORAL_TABLET | Freq: Four times a day (QID) | ORAL | Status: DC | PRN

## 2013-07-19 MED ORDER — CEPHALEXIN 500 MG PO CAPS: 500.0000 mg | ORAL_CAPSULE | Freq: Four times a day (QID) | ORAL | Status: DC

## 2013-07-19 NOTE — Discharge Summary (Signed)
Physician Discharge Summary  Patient ID: Cindy Armstrong MRN: [id number] DOB/AGE: 06/26/1990 23 y.o.  Admit date: 07/15/2013 Discharge date: 07/19/2013  Admission Diagnoses: pyelonephritis  Discharge Diagnoses: same Active Problems:   Pyelonephritis complicating pregnancy in second trimester, antepartum   Discharged Condition: good  Hospital Course: Patient presented to [facility]U on 6/5 with signs and symptoms consistent with pyelonephritis. She was admitted to receive IV fluids and antibiotics. She clinically improved over the course of her stay ans was found stable for discharge on 6/9 with plans to complete antibiotic therapy outpatient. Discharge instructions were provided  Consults: None  Significant Diagnostic Studies: microbiology: urine culture: positive for E. coli  Treatments: IV hydration and antibiotics: ceftriaxone  Discharge Exam: Blood pressure 112/56, pulse 78, temperature 98 F (36.7 C), temperature source Oral, resp. rate 18, height 5\' 3"  (1.6 m), weight 109 lb (49.442 kg), last menstrual period 03/21/2013, not currently breastfeeding. General appearance: alert, cooperative and no distress Resp: clear to auscultation bilaterally Cardio: regular rate and rhythm GI: soft, non-tender; bowel sounds normal; no masses,  no organomegaly Extremities: no edema, redness or tenderness in the calves or thighs Back: mild right CVA tenderness Disposition: 01-Home or Self Care     Medication List         acetaminophen 500 MG tablet  Commonly known as:  TYLENOL  Take 500 mg by mouth every 4 (four) hours as needed for moderate pain.     cephALEXin 500 MG capsule  Commonly known as:  KEFLEX  Take 1 capsule (500 mg total) by mouth 4 (four) times daily.     ondansetron 8 MG tablet  Commonly known as:  ZOFRAN  Take 8 mg by mouth every 8 (eight) hours as needed for nausea or vomiting.     oxyCODONE-acetaminophen 5-325 MG per tablet  Commonly known as:  PERCOCET/ROXICET  Take  1-2 tablets by mouth every 6 (six) hours as needed for severe pain.     prenatal multivitamin Tabs tablet  Take 1 tablet by mouth daily at 12 noon.       Follow-up Information   Follow up with [facility]. (as scheduled on 6/25 at 2pm)    Specialty:  Obstetrics and Gynecology   Contact information:   [place] [address] [phone]      Signed: Mora Bellman 07/19/2013, 6:41 AM

## 2013-07-19 NOTE — Progress Notes (Signed)
Patient's discharge complete she is waiting on a ride.

## 2013-07-19 NOTE — Discharge Instructions (Signed)
Complete antibiotics. Contact office or return to [facility]U for fever, worsening pain, persistent vomiting which prevents you from taking antibiotics

## 2013-07-19 NOTE — Progress Notes (Signed)
Patient doing well, she is anticipating discharge.  Instructions given to her verbally and she verbalizes understanding.  Exit care notes given. She will return to hospital if symptoms worsen.  She will keep scheduled appt. With MD

## 2013-07-20 ENCOUNTER — Encounter: Payer: Self-pay | Admitting: *Deleted

## 2013-07-20 DIAGNOSIS — O09219 Supervision of pregnancy with history of pre-term labor, unspecified trimester: Secondary | ICD-10-CM | POA: Insufficient documentation

## 2013-08-04 ENCOUNTER — Ambulatory Visit (INDEPENDENT_AMBULATORY_CARE_PROVIDER_SITE_OTHER): Payer: Medicaid Other | Admitting: Advanced Practice Midwife

## 2013-08-04 ENCOUNTER — Telehealth: Payer: Self-pay | Admitting: General Practice

## 2013-08-04 VITALS — BP 119/68 | HR 83 | Temp 97.8°F | Wt 117.6 lb

## 2013-08-04 DIAGNOSIS — O09219 Supervision of pregnancy with history of pre-term labor, unspecified trimester: Secondary | ICD-10-CM

## 2013-08-04 DIAGNOSIS — O09211 Supervision of pregnancy with history of pre-term labor, first trimester: Secondary | ICD-10-CM

## 2013-08-04 LAB — POCT URINALYSIS DIP (DEVICE)
Bilirubin Urine: NEGATIVE
Glucose, UA: NEGATIVE mg/dL
Hgb urine dipstick: NEGATIVE
LEUKOCYTES UA: NEGATIVE
Nitrite: NEGATIVE
PROTEIN: NEGATIVE mg/dL
SPECIFIC GRAVITY, URINE: 1.02 (ref 1.005–1.030)
Urobilinogen, UA: 0.2 mg/dL (ref 0.0–1.0)
pH: 6.5 (ref 5.0–8.0)

## 2013-08-04 LAB — OB RESULTS CONSOLE GC/CHLAMYDIA
CHLAMYDIA, DNA PROBE: NEGATIVE
GC PROBE AMP, GENITAL: NEGATIVE

## 2013-08-04 MED ORDER — DOCUSATE SODIUM 100 MG PO CAPS: 100.0000 mg | ORAL_CAPSULE | Freq: Two times a day (BID) | ORAL | Status: DC | PRN

## 2013-08-04 NOTE — Addendum Note (Signed)
Addended by: Novella Olive on: 08/04/2013 04:01 PM   Modules accepted: Orders

## 2013-08-04 NOTE — Progress Notes (Signed)
Doing well.  Feeling fetal movement, denies vaginal bleeding, LOF, regular contractions. Pt is concerned about whether her partner is faithful.  She is also having vaginal discharge with odor and itching.  She also has sharp occasional abdominal pain, mostly when walking.  Discussed pt work, recommend increase PO fluids, wear pregnancy support belt. Pelvic exam: Cervix pink, visually closed, without lesion, large amount frothy yellow discharge, vaginal walls and external genitalia normal.  Cervix 0/long/high.  Flagyl 500 mg BID x 7 days to start today.  Will call pt with GC and wet prep results if positive.

## 2013-08-04 NOTE — Progress Notes (Signed)
Here for ob fu.  C/o vaginal odor, vaginal discharge and irritation. Also had been in hospital for pyelo. Still c/o still having pain at times.

## 2013-08-04 NOTE — Telephone Encounter (Signed)
Vial of 17p already in office after patient left from OB appt. Called patient and asked when she could come in to receive 17p injection. She states she works tomorrow till 3:30 but has ultrasound appt on Tuesday 6/30 and can come after that. Patient had no further questions

## 2013-08-05 ENCOUNTER — Telehealth: Payer: Self-pay | Admitting: Advanced Practice Midwife

## 2013-08-05 LAB — WET PREP, GENITAL
TRICH WET PREP: NONE SEEN
YEAST WET PREP: NONE SEEN

## 2013-08-05 LAB — GC/CHLAMYDIA PROBE AMP
CT PROBE, AMP APTIMA: NEGATIVE
GC Probe RNA: NEGATIVE

## 2013-08-05 MED ORDER — METRONIDAZOLE 500 MG PO TABS: 500.0000 mg | ORAL_TABLET | Freq: Two times a day (BID) | ORAL | Status: DC

## 2013-08-05 NOTE — Telephone Encounter (Signed)
Called pt to inform her that wet prep only positive for BV, not trichomonas.  Pt phone (both numbers) not accepting calls at this time.  [facility] still pending.  Flagyl 500 mg BID x 7 days sent to pt pharmacy.  Please call clinic with any questions.

## 2013-08-09 ENCOUNTER — Ambulatory Visit (HOSPITAL_COMMUNITY)
Admission: RE | Admit: 2013-08-09 | Discharge: 2013-08-09 | Disposition: A | Discharge status: Home / Self Care | Payer: Medicaid Other | Source: Ambulatory Visit | Attending: Advanced Practice Midwife | Admitting: Advanced Practice Midwife

## 2013-08-09 ENCOUNTER — Other Ambulatory Visit: Payer: Self-pay | Admitting: Advanced Practice Midwife

## 2013-08-09 ENCOUNTER — Ambulatory Visit (INDEPENDENT_AMBULATORY_CARE_PROVIDER_SITE_OTHER): Payer: Medicaid Other | Admitting: General Practice

## 2013-08-09 VITALS — BP 115/63 | HR 60 | Temp 97.4°F | Ht 63.0 in | Wt 119.7 lb

## 2013-08-09 DIAGNOSIS — O09219 Supervision of pregnancy with history of pre-term labor, unspecified trimester: Secondary | ICD-10-CM

## 2013-08-09 DIAGNOSIS — O2302 Infections of kidney in pregnancy, second trimester: Secondary | ICD-10-CM

## 2013-08-09 DIAGNOSIS — O09211 Supervision of pregnancy with history of pre-term labor, first trimester: Secondary | ICD-10-CM

## 2013-08-09 DIAGNOSIS — O09212 Supervision of pregnancy with history of pre-term labor, second trimester: Secondary | ICD-10-CM

## 2013-08-09 DIAGNOSIS — Z3689 Encounter for other specified antenatal screening: Secondary | ICD-10-CM | POA: Diagnosis not present

## 2013-08-09 DIAGNOSIS — O283 Abnormal ultrasonic finding on antenatal screening of mother: Secondary | ICD-10-CM

## 2013-08-09 MED ORDER — HYDROXYPROGESTERONE CAPROATE 250 MG/ML IM OIL
250.0000 mg | TOPICAL_OIL | INTRAMUSCULAR | Status: DC
  Administered 2013-08-09 – 2013-11-03 (×12): 250 mg via INTRAMUSCULAR

## 2013-08-12 DIAGNOSIS — O283 Abnormal ultrasonic finding on antenatal screening of mother: Secondary | ICD-10-CM | POA: Insufficient documentation

## 2013-08-16 ENCOUNTER — Ambulatory Visit: Payer: Medicaid Other

## 2013-08-18 ENCOUNTER — Ambulatory Visit (INDEPENDENT_AMBULATORY_CARE_PROVIDER_SITE_OTHER): Payer: Medicaid Other

## 2013-08-18 ENCOUNTER — Encounter: Payer: Self-pay | Admitting: Obstetrics and Gynecology

## 2013-08-18 VITALS — BP 122/73 | HR 76 | Temp 98.9°F | Ht 63.0 in | Wt 122.1 lb

## 2013-08-18 DIAGNOSIS — O09212 Supervision of pregnancy with history of pre-term labor, second trimester: Secondary | ICD-10-CM

## 2013-08-18 DIAGNOSIS — O09219 Supervision of pregnancy with history of pre-term labor, unspecified trimester: Secondary | ICD-10-CM

## 2013-08-18 IMAGING — US US OB FOLLOW-UP
1 series · 12 of 28 positions shown · non-contrast
Comparison: none

[Series 1: us ob follow up · 33 acquisitions, 12 frames shown]
[im 2/33]
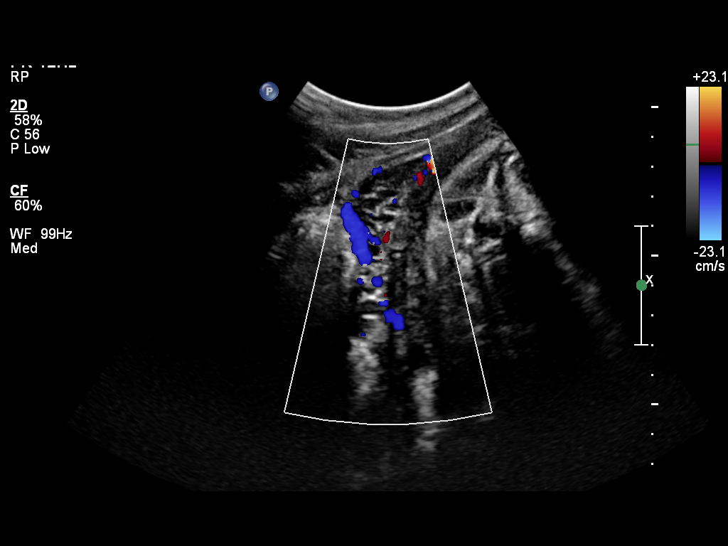
[im 4/33]
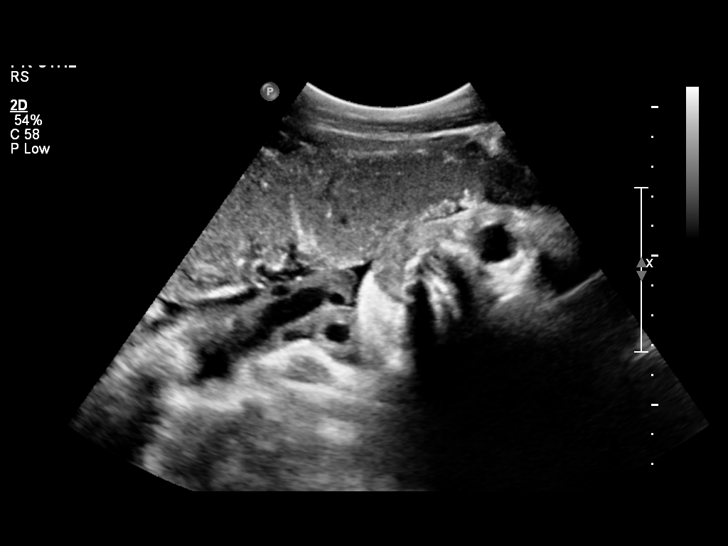
[im 6/33]
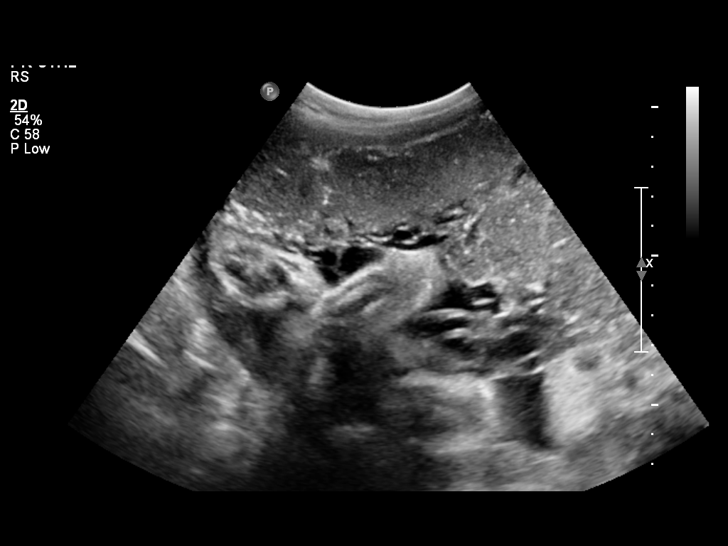
[im 10/33]
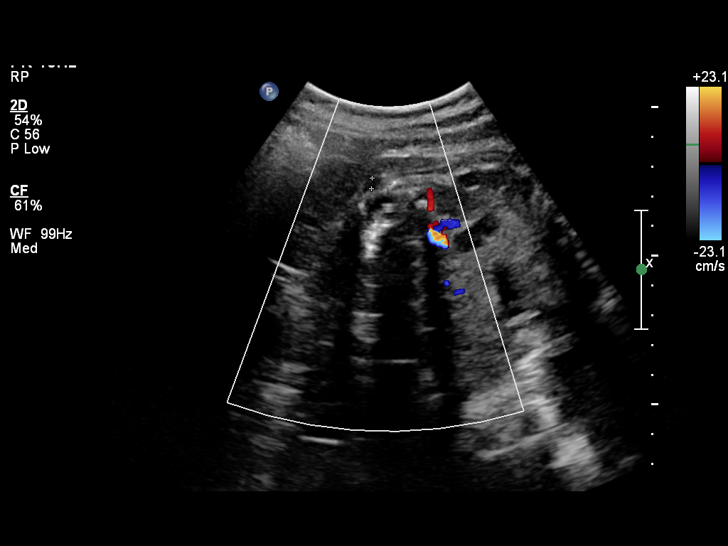
[im 12/33]
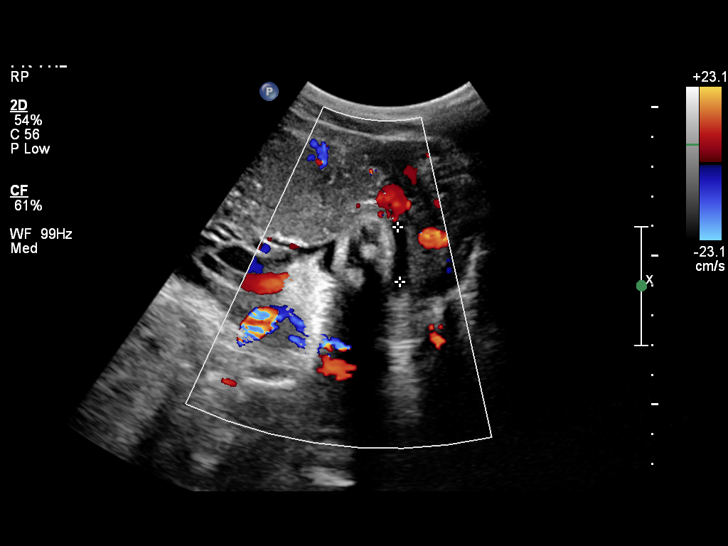
[im 15/33]
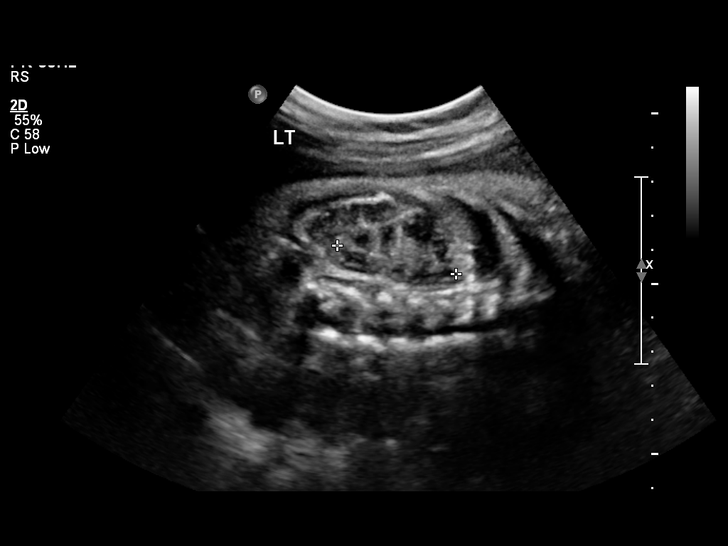
[im 18/33]
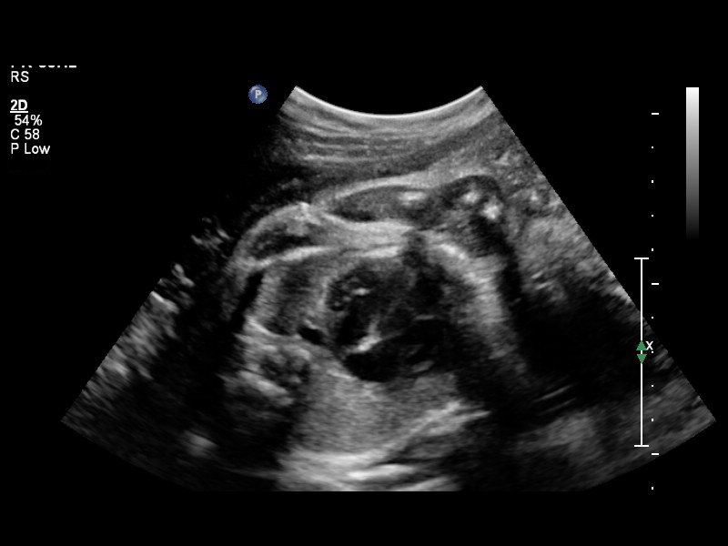
[im 21/33]
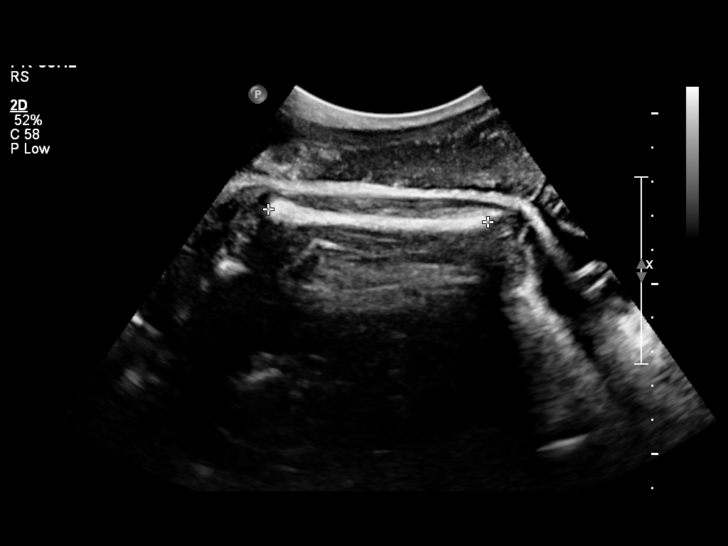
[im 23/33]
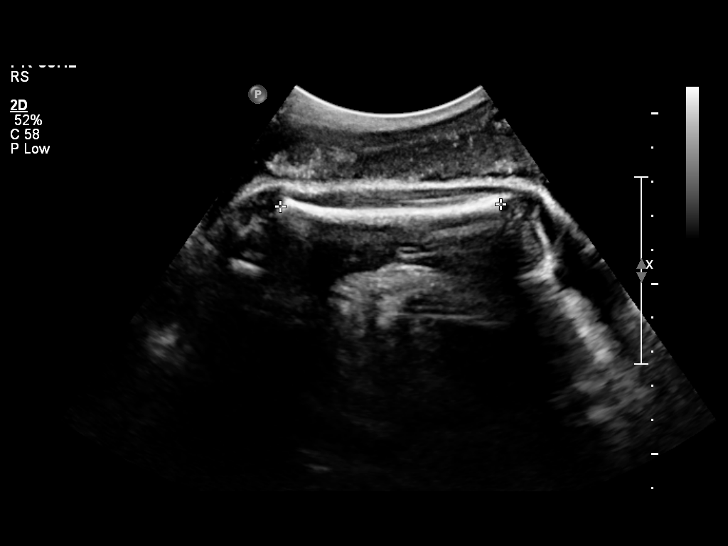
[im 27/33]
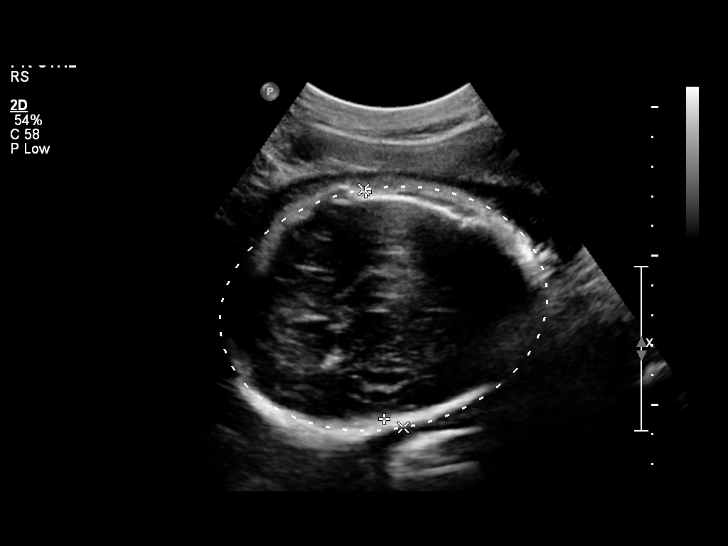
[im 29/33]
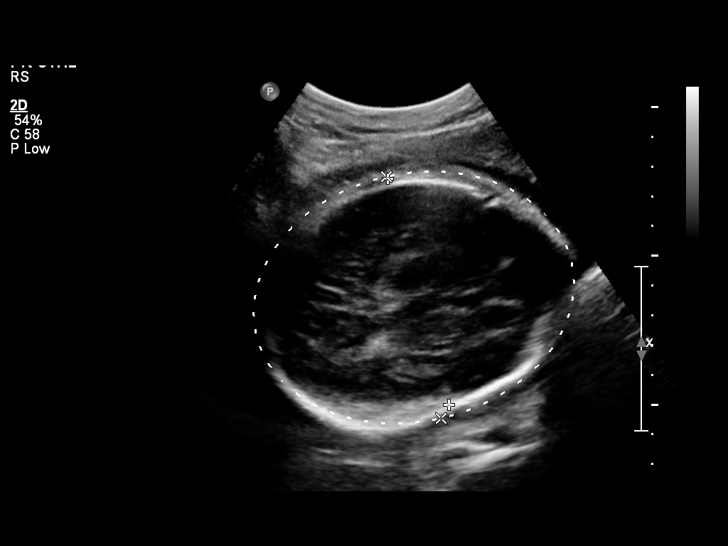
[im 31/33]
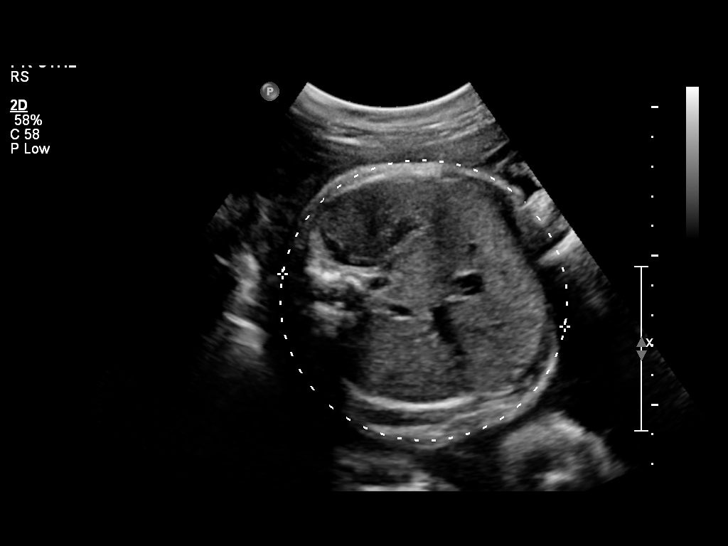

[12 of 28 positions shown; findings below may reference images not displayed]

OBSTETRICS REPORT
                      (Signed Final 01/02/2012 [DATE])

Service(s) Provided

 US OB FOLLOW UP                                       76816.1
Indications

 Growth
 AFI
 Premature rupture of membranes - leaking fluid
 No or Little Prenatal Care
Fetal Evaluation

 Num Of Fetuses:    1
 Preg. Location:    Intrauterine
 Fetal Heart Rate:  115                         bpm
 Cardiac Activity:  Observed
 Presentation:      Cephalic
 Placenta:          Anterior, above cervical os
 P. Cord            Previously Visualized
 Insertion:

 Amniotic Fluid
 AFI FV:      Subjectively decreased
 AFI Sum:     5.36    cm      < 3  %Tile     Larg Pckt:   1.93   cm
 RUQ:   0.36   cm    RLQ:    1.24   cm    LUQ:   1.93    cm   LLQ:    1.83   cm
Biometry

 BPD:     78.4  mm    G. Age:   31w 3d                CI:        68.95   70 - 86
                                                      FL/HC:      21.4   19.4 -

 HC:     301.6  mm    G. Age:   33w 3d       10  %    HC/AC:      1.02   0.96 -

 AC:     296.8  mm    G. Age:   33w 5d       48  %    FL/BPD:     82.1   71 - 87
 FL:      64.4  mm    G. Age:   33w 2d       24  %    FL/AC:      21.7   20 - 24
 HUM:     59.4  mm    G. Age:   34w 3d       69  %

 Est. FW:    2521  gm    4 lb 12 oz      48  %
Gestational Age

 LMP:           36w 0d       Date:   04/25/11                 EDD:   01/30/12
 U/S Today:     33w 0d                                        EDD:   02/20/12
 Best:          33w 6d    Det. By:   U/S (12/03/11)           EDD:   02/14/12
Anatomy

 Cranium:          Appears normal         Aortic Arch:      Basic anatomy
                                                            exam per order
 Fetal Cavum:      Previously seen        Ductal Arch:      Basic anatomy
                                                            exam per order
 Ventricles:       Appears normal         Diaphragm:        Previously seen
 Choroid Plexus:   Previously seen        Stomach:          Appears normal, left
                                                            sided
 Cerebellum:       Previously seen        Abdomen:          Appears normal
 Posterior Fossa:  Previously seen        Abdominal Wall:   Not well visualized
 Nuchal Fold:      Not applicable (>20    Cord Vessels:     Appears normal (3
                   wks GA)                                  vessel cord)
 Face:             Orbits and profile     Kidneys:          Appear normal
                   previously seen
 Lips:             Previously seen        Bladder:          Appears normal
 Heart:            Appears normal         Spine:            Previously seen
                   (4CH, axis, and
                   situs)
 RVOT:             Previously seen        Lower             Previously seen
                                          Extremities:
 LVOT:             Previously seen        Upper             Previously seen
                                          Extremities:

 Other:  Technically difficult due to advanced GA. Nasal bone visualized.
         Fetus appears to be a male.
Cervix Uterus Adnexa

 Cervix:       Not visualized (advanced GA >34 wks)
 Uterus:       No abnormality visualized.
 Left Ovary:   No adnexal mass visualized.
 Right Ovary:  No adnexal mass visualized.

 Adnexa:     No abnormality visualized.
Impression

 Single intrauterine gestation demonstrating an estimated
 gestational age by ultrasound of 33w 0d. This is correlated
 with expected estimated gestational age by initial ultrasound
 of 33w 6d. EFW is currently at the 48%.

 No late developing fetal anatomic abnormalities are noted
 associated with the lateral ventricles, four chamber heart,
 stomach, kidneys or bladder.

 Subjectively and quantitatively decreased amniotic fluid
 volume with an AFI < 3% and correlating with history of
 PROM.

## 2013-08-23 ENCOUNTER — Ambulatory Visit: Payer: Medicaid Other

## 2013-08-25 ENCOUNTER — Ambulatory Visit (INDEPENDENT_AMBULATORY_CARE_PROVIDER_SITE_OTHER): Payer: Medicaid Other

## 2013-08-25 VITALS — BP 130/69 | HR 76 | Wt 121.6 lb

## 2013-08-25 DIAGNOSIS — O09213 Supervision of pregnancy with history of pre-term labor, third trimester: Secondary | ICD-10-CM

## 2013-08-25 DIAGNOSIS — O09219 Supervision of pregnancy with history of pre-term labor, unspecified trimester: Secondary | ICD-10-CM

## 2013-09-01 ENCOUNTER — Ambulatory Visit (INDEPENDENT_AMBULATORY_CARE_PROVIDER_SITE_OTHER): Payer: Medicaid Other | Admitting: Obstetrics & Gynecology

## 2013-09-01 VITALS — BP 113/55 | HR 71 | Temp 97.9°F | Wt 124.3 lb

## 2013-09-01 DIAGNOSIS — N12 Tubulo-interstitial nephritis, not specified as acute or chronic: Secondary | ICD-10-CM

## 2013-09-01 DIAGNOSIS — Z1389 Encounter for screening for other disorder: Secondary | ICD-10-CM

## 2013-09-01 DIAGNOSIS — O09212 Supervision of pregnancy with history of pre-term labor, second trimester: Secondary | ICD-10-CM

## 2013-09-01 DIAGNOSIS — O09219 Supervision of pregnancy with history of pre-term labor, unspecified trimester: Secondary | ICD-10-CM

## 2013-09-01 DIAGNOSIS — O2390 Unspecified genitourinary tract infection in pregnancy, unspecified trimester: Secondary | ICD-10-CM

## 2013-09-01 DIAGNOSIS — O2302 Infections of kidney in pregnancy, second trimester: Secondary | ICD-10-CM

## 2013-09-01 DIAGNOSIS — IMO0002 Reserved for concepts with insufficient information to code with codable children: Secondary | ICD-10-CM

## 2013-09-01 DIAGNOSIS — Z0489 Encounter for examination and observation for other specified reasons: Secondary | ICD-10-CM

## 2013-09-01 LAB — POCT URINALYSIS DIP (DEVICE)
Bilirubin Urine: NEGATIVE
Glucose, UA: NEGATIVE mg/dL
HGB URINE DIPSTICK: NEGATIVE
Ketones, ur: NEGATIVE mg/dL
NITRITE: NEGATIVE
PH: 7 (ref 5.0–8.0)
PROTEIN: NEGATIVE mg/dL
Specific Gravity, Urine: 1.02 (ref 1.005–1.030)
UROBILINOGEN UA: 0.2 mg/dL (ref 0.0–1.0)

## 2013-09-01 NOTE — Progress Notes (Signed)
Repeat OB scan ordered to follow up incomplete anatomy, ?anomalies about nuchal fold and fetal kidneys on 20 week scan Continue weekly 17P.  1 hr GTT, other third trimester labs, Tdap in 4 weeks.   No other complaints or concerns.  Routine obstetric precautions reviewed.

## 2013-09-01 NOTE — Patient Instructions (Addendum)
Return to clinic for any obstetric concerns or go to MAU for evaluation Tdap Vaccine (Tetanus, Diphtheria, Pertussis): What You Need to Know 1. Why get vaccinated? Tetanus, diphtheria and pertussis can be very serious diseases, even for adolescents and adults. Tdap vaccine can protect us from these diseases. TETANUS (Lockjaw) causes painful muscle tightening and stiffness, usually all over the body.  It can lead to tightening of muscles in the head and neck so you can't open your mouth, swallow, or sometimes even breathe. Tetanus kills about 1 out of 5 people who are infected. DIPHTHERIA can cause a thick coating to form in the back of the throat.  It can lead to breathing problems, paralysis, heart failure, and death. PERTUSSIS (Whooping Cough) causes severe coughing spells, which can cause difficulty breathing, vomiting and disturbed sleep.  It can also lead to weight loss, incontinence, and rib fractures. Up to 2 in 100 adolescents and 5 in 100 adults with pertussis are hospitalized or have complications, which could include pneumonia or death. These diseases are caused by bacteria. Diphtheria and pertussis are spread from person to person through coughing or sneezing. Tetanus enters the body through cuts, scratches, or wounds. Before vaccines, the [place] saw as many as 200,000 cases a year of diphtheria and pertussis, and hundreds of cases of tetanus. Since vaccination began, tetanus and diphtheria have dropped by about 99% and pertussis by about 80%. 2. Tdap vaccine Tdap vaccine can protect adolescents and adults from tetanus, diphtheria, and pertussis. One dose of Tdap is routinely given at age 11 or 12. People who did not get Tdap at that age should get it as soon as possible. Tdap is especially important for health care professionals and anyone having close contact with a baby younger than 12 months. Pregnant women should get a dose of Tdap during every pregnancy, to protect the  newborn from pertussis. Infants are most at risk for severe, life-threatening complications from pertussis. A similar vaccine, called Td, protects from tetanus and diphtheria, but not pertussis. A Td booster should be given every 10 years. Tdap may be given as one of these boosters if you have not already gotten a dose. Tdap may also be given after a severe cut or burn to prevent tetanus infection. Your doctor can give you more information. Tdap may safely be given at the same time as other vaccines. 3. Some people should not get this vaccine  If you ever had a life-threatening allergic reaction after a dose of any tetanus, diphtheria, or pertussis containing vaccine, OR if you have a severe allergy to any part of this vaccine, you should not get Tdap. Tell your doctor if you have any severe allergies.  If you had a coma, or long or multiple seizures within 7 days after a childhood dose of DTP or DTaP, you should not get Tdap, unless a cause other than the vaccine was found. You can still get Td.  Talk to your doctor if you:  have epilepsy or another nervous system problem,  had severe pain or swelling after any vaccine containing diphtheria, tetanus or pertussis,  ever had Guillain-Barr Syndrome (GBS),  aren't feeling well on the day the shot is scheduled. 4. Risks of a vaccine reaction With any medicine, including vaccines, there is a chance of side effects. These are usually mild and go away on their own, but serious reactions are also possible. Brief fainting spells can follow a vaccination, leading to injuries from falling. Sitting or lying down for   about 15 minutes can help prevent these. Tell your doctor if you feel dizzy or light-headed, or have vision changes or ringing in the ears. Mild problems following Tdap (Did not interfere with activities)  Pain where the shot was given (about 3 in 4 adolescents or 2 in 3 adults)  Redness or swelling where the shot was given (about 1  person in 5)  Mild fever of at least 100.4F (up to about 1 in 25 adolescents or 1 in 100 adults)  Headache (about 3 or 4 people in 10)  Tiredness (about 1 person in 3 or 4)  Nausea, vomiting, diarrhea, stomach ache (up to 1 in 4 adolescents or 1 in 10 adults)  Chills, body aches, sore joints, rash, swollen glands (uncommon) Moderate problems following Tdap (Interfered with activities, but did not require medical attention)  Pain where the shot was given (about 1 in 5 adolescents or 1 in 100 adults)  Redness or swelling where the shot was given (up to about 1 in 16 adolescents or 1 in 25 adults)  Fever over 102F (about 1 in 100 adolescents or 1 in 250 adults)  Headache (about 3 in 20 adolescents or 1 in 10 adults)  Nausea, vomiting, diarrhea, stomach ache (up to 1 or 3 people in 100)  Swelling of the entire arm where the shot was given (up to about 3 in 100). Severe problems following Tdap (Unable to perform usual activities; required medical attention)  Swelling, severe pain, bleeding and redness in the arm where the shot was given (rare). A severe allergic reaction could occur after any vaccine (estimated less than 1 in a million doses). 5. What if there is a serious reaction? What should I look for?  Look for anything that concerns you, such as signs of a severe allergic reaction, very high fever, or behavior changes. Signs of a severe allergic reaction can include hives, swelling of the face and throat, difficulty breathing, a fast heartbeat, dizziness, and weakness. These would start a few minutes to a few hours after the vaccination. What should I do?  If you think it is a severe allergic reaction or other emergency that can't wait, call 9-1-1 or get the person to the nearest hospital. Otherwise, call your doctor.  Afterward, the reaction should be reported to the "Vaccine Adverse Event Reporting System" (VAERS). Your doctor might file this report, or you can do it  yourself through the VAERS web site at www.vaers.hhs.gov, or by calling 1-800-822-7967. VAERS is only for reporting reactions. They do not give medical advice.  6. The National Vaccine Injury Compensation Program The National Vaccine Injury Compensation Program (VICP) is a federal program that was created to compensate people who may have been injured by certain vaccines. Persons who believe they may have been injured by a vaccine can learn about the program and about filing a claim by calling 1-800-338-2382 or visiting the VICP website at www.hrsa.gov/vaccinecompensation. 7. How can I learn more?  Ask your doctor.  Call your local or state health department.  Contact the Centers for Disease Control and Prevention (CDC):  Call 1-800-232-4636 or visit CDC's website at www.cdc.gov/vaccines. CDC Tdap Vaccine VIS (06/19/11) Document Released: 07/29/2011 Document Revised: 06/13/2013 Document Reviewed: 05/11/2013 ExitCare Patient Information 2015 [employer]. This information is not intended to replace advice given to you by your health care provider. Make sure you discuss any questions you have with your health care provider.  

## 2013-09-02 ENCOUNTER — Encounter: Payer: Medicaid Other | Admitting: Obstetrics & Gynecology

## 2013-09-06 ENCOUNTER — Ambulatory Visit (HOSPITAL_COMMUNITY)
Admission: RE | Admit: 2013-09-06 | Discharge: 2013-09-06 | Disposition: A | Discharge status: Home / Self Care | Payer: Medicaid Other | Source: Ambulatory Visit | Attending: Obstetrics & Gynecology | Admitting: Obstetrics & Gynecology

## 2013-09-06 ENCOUNTER — Ambulatory Visit (HOSPITAL_COMMUNITY)
Admission: RE | Admit: 2013-09-06 | Discharge: 2013-09-06 | Disposition: A | Discharge status: Home / Self Care | Payer: Medicaid Other | Source: Ambulatory Visit

## 2013-09-06 ENCOUNTER — Observation Stay (HOSPITAL_COMMUNITY)
Admission: AD | Admit: 2013-09-06 | Discharge: 2013-09-07 | Disposition: A | Discharge status: Home / Self Care | Payer: Medicaid Other | Source: Ambulatory Visit | Attending: Obstetrics & Gynecology | Admitting: Obstetrics & Gynecology

## 2013-09-06 ENCOUNTER — Other Ambulatory Visit: Payer: Self-pay

## 2013-09-06 ENCOUNTER — Other Ambulatory Visit: Payer: Self-pay | Admitting: Obstetrics & Gynecology

## 2013-09-06 ENCOUNTER — Encounter (HOSPITAL_COMMUNITY): Payer: Self-pay | Admitting: *Deleted

## 2013-09-06 VITALS — BP 121/66 | HR 79 | Wt 128.0 lb

## 2013-09-06 DIAGNOSIS — Z1389 Encounter for screening for other disorder: Secondary | ICD-10-CM

## 2013-09-06 DIAGNOSIS — IMO0002 Reserved for concepts with insufficient information to code with codable children: Secondary | ICD-10-CM

## 2013-09-06 DIAGNOSIS — O358XX0 Maternal care for other (suspected) fetal abnormality and damage, not applicable or unspecified: Secondary | ICD-10-CM | POA: Insufficient documentation

## 2013-09-06 DIAGNOSIS — O09212 Supervision of pregnancy with history of pre-term labor, second trimester: Secondary | ICD-10-CM

## 2013-09-06 DIAGNOSIS — Z0489 Encounter for examination and observation for other specified reasons: Secondary | ICD-10-CM

## 2013-09-06 DIAGNOSIS — O4700 False labor before 37 completed weeks of gestation, unspecified trimester: Principal | ICD-10-CM | POA: Insufficient documentation

## 2013-09-06 DIAGNOSIS — O289 Unspecified abnormal findings on antenatal screening of mother: Secondary | ICD-10-CM | POA: Insufficient documentation

## 2013-09-06 DIAGNOSIS — Z363 Encounter for antenatal screening for malformations: Secondary | ICD-10-CM | POA: Insufficient documentation

## 2013-09-06 DIAGNOSIS — O09219 Supervision of pregnancy with history of pre-term labor, unspecified trimester: Secondary | ICD-10-CM | POA: Diagnosis not present

## 2013-09-06 DIAGNOSIS — O283 Abnormal ultrasonic finding on antenatal screening of mother: Secondary | ICD-10-CM

## 2013-09-06 LAB — CBC
HCT: 33.3 % — ABNORMAL LOW (ref 36.0–46.0)
HEMOGLOBIN: 11.2 g/dL — AB (ref 12.0–15.0)
MCH: 30.4 pg (ref 26.0–34.0)
MCHC: 33.6 g/dL (ref 30.0–36.0)
MCV: 90.5 fL (ref 78.0–100.0)
Platelets: 185 10*3/uL (ref 150–400)
RBC: 3.68 MIL/uL — ABNORMAL LOW (ref 3.87–5.11)
RDW: 14.9 % (ref 11.5–15.5)
WBC: 10.4 10*3/uL (ref 4.0–10.5)

## 2013-09-06 MED ORDER — PRENATAL MULTIVITAMIN CH
1.0000 | ORAL_TABLET | Freq: Every day | ORAL | Status: DC
  Administered 2013-09-06 – 2013-09-07 (×2): 1 via ORAL
  Filled 2013-09-06 (×4): qty 1

## 2013-09-06 MED ORDER — CALCIUM CARBONATE ANTACID 500 MG PO CHEW
2.0000 | CHEWABLE_TABLET | ORAL | Status: DC | PRN
  Filled 2013-09-06: qty 2

## 2013-09-06 MED ORDER — NIFEDIPINE 10 MG PO CAPS
10.0000 mg | ORAL_CAPSULE | Freq: Four times a day (QID) | ORAL | Status: DC
  Administered 2013-09-06 – 2013-09-07 (×3): 10 mg via ORAL
  Filled 2013-09-06 (×3): qty 1

## 2013-09-06 MED ORDER — NIFEDIPINE 10 MG PO CAPS
20.0000 mg | ORAL_CAPSULE | Freq: Once | ORAL | Status: AC
  Administered 2013-09-06: 20 mg via ORAL
  Filled 2013-09-06: qty 2

## 2013-09-06 MED ORDER — BETAMETHASONE SOD PHOS & ACET 6 (3-3) MG/ML IJ SUSP
12.0000 mg | Freq: Once | INTRAMUSCULAR | Status: AC
  Administered 2013-09-06: 12 mg via INTRAMUSCULAR
  Filled 2013-09-06: qty 2

## 2013-09-06 MED ORDER — ZOLPIDEM TARTRATE 5 MG PO TABS: 5.0000 mg | ORAL_TABLET | Freq: Every evening | ORAL | Status: DC | PRN

## 2013-09-06 MED ORDER — ACETAMINOPHEN 325 MG PO TABS: 650.0000 mg | ORAL_TABLET | ORAL | Status: DC | PRN

## 2013-09-06 MED ORDER — DOCUSATE SODIUM 100 MG PO CAPS
100.0000 mg | ORAL_CAPSULE | Freq: Every day | ORAL | Status: DC
  Administered 2013-09-06 – 2013-09-07 (×2): 100 mg via ORAL
  Filled 2013-09-06 (×4): qty 1

## 2013-09-06 NOTE — Progress Notes (Signed)
Dr Virginia Crews notified of pt's admission from MFM. Will see pt when finished in BS.

## 2013-09-06 NOTE — Consult Note (Signed)
MFM consult  23 yr old G2P0101 at [redacted]w[redacted]d referred for follow up ultrasound to complete fetal anatomic survey and consult. History of preterm delivery; previous finding of urinary tract dilation.  Ultrasound today shows: single intrauterine pregnancy. Estimated fetal weight is in the 54th%. Anterior placenta without evidence of previa. Normal amniotic fluid volume. Shortened transvaginal cervical length to 1.13cm; there is funneling seen at the internal cervical os. There is bilateral urinary tract dilation; the renal pelves measure 5.5-43mm; the ureters and bladder appear normal. There is an echogenic focus in the left ventricle. The remainder of the limited anatomy survey is normal. Any anatomy not evaluated on today's exam was evaluated on the previous exam.  I counseled the patient as follows: 1. Appropriate fetal growth. 2. Fetal anatomic survey is complete. 3. Urinary tract dilation (class I):  discussed slight association with fetal aneuploidy; specifically trisomy 21.  Discussed the etiology could be benign finding or variant of normal- this is most likely given the minimal dilation. Pyelectasis can also be caused by obstruction or vesicoureteral reflux. I discussed that the majority of the cases resolve antepartum or shortly after delivery. A small percentage of cases may require prophylactic antibiotics, renal ultrasound, and VCUG, or surgical correction. I have recommended the patient follow up at 28 weeks to reevaluate the fetal kidneys.  4. Echogenic focus in the left ventricle: - discussed slight association with fetal aneuploidy (specifically trisomy 21) - given this and finding of urinary tract dilation patient was seen by our genetic couselor; see separate report - after counseling patient opted for cell free fetal DNA which was drawn today 5. Short cervix/ history of preterm delivery: - patient is on 17OH progesterone - discussed this finding along with her history of preterm delivery  places her at significantly increased risk of preterm delivery - patient is already on 17OH progesterone; there is no data to suggest improved outcomes with addition of vaginal progesterone - would recommend a course of betamethasone and evaluation for preterm labor - would recommend close surveillance for further cervical shortening, signs/symptoms of preterm labor/ PPROM - if further cervical shortenening can consider pessary placement - can consider repeat cervical length in 1 week to evaluate for above  Discussed with Dr. Gala Romney and patient was sent to MAU for evaluation and betamethasone.  I spent a total of 45 minutes with the patient of which >50% was in face to face consultation.  Please call with questions.  [place], MD

## 2013-09-06 NOTE — H&P (Signed)
Cindy Armstrong is a 22 y.o. female G2P0101 with IUP at [redacted]w[redacted]d presenting from MFM clinic due to shortened cervix on ultrasound. Pt states she has been having none contractions, associated with none vaginal bleeding.  Membranes are intact, with active fetal movement.   [facility] at [facility]  since 12 wks  Prenatal History/Complications: Pt. Is 23 y/o G2P0101 at [redacted]W[redacted]D with pregnancy complicated by peylonephritis. She has a history of preterm delivery at 34W due to cervical insufficiency.    Presents from MFM clinic today after ultrasound revealed shortened cervix at 1`.13 cm with funneling of the internal os.  She denies VB, LOF, She has +FM. She has no other complaints at this time.    Past Medical History: Past Medical History  Diagnosis Date  . No pertinent past medical history   . Preterm labor     Past Surgical History: Past Surgical History  Procedure Laterality Date  . No past surgeries      Obstetrical History: OB History   Grav Para Term Preterm Abortions TAB SAB Ect Mult Living   2 1 0 1 0 0 0 0 0 1       Gynecological History: OB History   Grav Para Term Preterm Abortions TAB SAB Ect Mult Living   2 1 0 1 0 0 0 0 0 1       Social History: History   Social History  . Marital Status: Single    Spouse Name: N/A    Number of Children: N/A  . Years of Education: N/A   Social History Main Topics  . Smoking status: Light Tobacco Smoker -- 0.25 packs/day for 3 years    Types: Cigarettes  . Smokeless tobacco: Never Used  . Alcohol Use: No  . Drug Use: No  . Sexual Activity: Yes    Birth Control/ Protection: None     Comment: pt smokes 2 cigarettes a day   Other Topics Concern  . None   Social History Narrative  . None    Family History: Family History  Problem Relation Age of Onset  . Alcohol abuse Neg Hx   . Arthritis Neg Hx   . Asthma Neg Hx   . Birth defects Neg Hx   . Cancer Neg Hx   . COPD Neg Hx   . Depression Neg Hx   . Early death Neg Hx   .  Hearing loss Neg Hx   . Heart disease Neg Hx   . Hyperlipidemia Neg Hx   . Hypertension Neg Hx   . Kidney disease Neg Hx   . Mental illness Neg Hx   . Mental retardation Neg Hx   . Miscarriages / Stillbirths Neg Hx   . Stroke Neg Hx   . Vision loss Neg Hx   . Other Neg Hx   . Diabetes Mother   . Drug abuse Mother   . Learning disabilities Brother     Allergies: No Known Allergies  Facility-administered medications prior to admission  Medication Dose Route Frequency Provider Last Rate Last Dose  . hydroxyprogesterone caproate (DELALUTIN) 250 mg/mL injection 250 mg  250 mg Intramuscular Weekly Peggy Constant, MD   250 mg at 09/01/13 0932   Prescriptions prior to admission  Medication Sig Dispense Refill  . acetaminophen (TYLENOL) 500 MG tablet Take 500 mg by mouth every 4 (four) hours as needed for moderate pain.      Marland Kitchen docusate sodium (COLACE) 100 MG capsule Take 100 mg by mouth 2 (two) times daily  as needed for mild constipation.      . ondansetron (ZOFRAN) 8 MG tablet Take 8 mg by mouth every 8 (eight) hours as needed for nausea or vomiting.      . Prenatal Vit-Fe Fumarate-FA (PRENATAL MULTIVITAMIN) TABS tablet Take 1 tablet by mouth daily at 12 noon.  30 tablet  12  . promethazine (PHENERGAN) 25 MG tablet Take 25 mg by mouth every 6 (six) hours as needed for nausea or vomiting. Take 1/2 tablet as needed         Review of Systems  Per HPI  Blood pressure 123/70, pulse 64, temperature 98.2 F (36.8 C), temperature source Oral, resp. rate 18, height 5\' 3"  (1.6 m), weight 57.607 kg (127 lb), last menstrual period 03/21/2013, not currently breastfeeding. General appearance: alert, cooperative and no distress Lungs: clear to auscultation bilaterally Heart: regular rate and rhythm Abdomen: soft, non-tender; bowel sounds normal Pelvic: 1.5cm, 50%, Ballotable.  Extremities: Homans sign is negative, no sign of DVT Grossly neurologically intact Presentation: cephalic Fetal  monitoringNone Uterine activityNone Dilation: 1.5 Effacement (%): 50 Station: Ballotable   Prenatal labs: ABO, Rh: O/POS/-- (05/05 0955) Antibody: NEG (05/05 0955) Rubella:   RPR: NON REAC (05/05 0955)  HBsAg: NEGATIVE (05/05 0955)  HIV: NONREACTIVE (05/05 0955)  GBS:    1 hr Glucola NA Genetic screening  - None Anatomy US - Dilated kidneys  Abnormal nuchal fold   Prenatal Transfer Tool  Maternal Diabetes: No Genetic Screening: Declined Maternal Ultrasounds/Referrals: Normal Fetal Ultrasounds or other Referrals:  Other:  Dilated kidneys  Abnormal nuchal fold Maternal Substance Abuse:  No Significant Maternal Medications:  None Significant Maternal Lab Results: None     Results for orders placed during the hospital encounter of 09/06/13 (from the past 24 hour(s))  CBC   Collection Time    09/06/13  3:10 PM      Result Value Ref Range   WBC 10.4  4.0 - 10.5 K/uL   RBC 3.68 (*) 3.87 - 5.11 MIL/uL   Hemoglobin 11.2 (*) 12.0 - 15.0 g/dL   HCT 33.3 (*) 36.0 - 46.0 %   MCV 90.5  78.0 - 100.0 fL   MCH 30.4  26.0 - 34.0 pg   MCHC 33.6  30.0 - 36.0 g/dL   RDW 14.9  11.5 - 15.5 %   Platelets 185  150 - 400 K/uL    Assessment: Cindy Armstrong is a 23 y.o. G2P0101 at [redacted]w[redacted]d by here for evaluation for shortened cervix with history of preterm delivery due to cervical insufficiency.  - BMZ today, One more dose in 24hrs.  - Procardia for Uterine irritability - Admit for observation  Savahna Casados G 09/06/2013, 7:17 PM

## 2013-09-06 NOTE — Progress Notes (Signed)
Report called to [facility] in antenatal. Pt to ante via w/c

## 2013-09-06 NOTE — Progress Notes (Signed)
Genetic Counseling  High-Risk Gestation Note  Appointment Date:  09/06/2013 Referred By: Verita Schneiders, MD Date of Birth:  May 01, 1990    Pregnancy History: G2P0101 Estimated Date of Delivery: 12/26/13 Estimated Gestational Age: [redacted]w[redacted]d Attending: [place], MD   [name] was seen for genetic counseling because of abnormal ultrasound findings.    Ultrasound performed today revealed an echogenic intracardiac focus (EIF) and bilateral renal pyelectasis (right-70mm and left-39mm). Remainder of limited fetal anatomic survey appeared normal. Shortened cervical length was visualized. See ultrasound report for additional information. Complete ultrasound results reported separately.    We discussed that the second trimester genetic sonogram is targeted at identifying features associated with aneuploidy.  It has evolved as a screening tool used to provide an individualized risk assessment for Down syndrome and other trisomies.  The ability of sonography to aid in the detection of aneuploidies relies on identification of both major structural anomalies and "soft markers."  The patient was counseled that the latter term refers to findings that are often normal variants and do not cause any significant medical problems.  Nonetheless, these markers have a known association with aneuploidy.    The patient was counseled that an EIF is characterized by calcified papillary muscle leading to a discreet dot in the left, or less commonly, the right ventricle.  We discussed that this finding is typically considered to be a benign variant; however, the risk of aneuploidy is increased when this marker is found in patients who have additional risk factors for fetal aneuploidy (advanced maternal age, abnormal screening test, or other markers or anomalies by fetal ultrasound).   We then discussed that pyelectasis is identified when the antero-posterior diameter of either renal pelvis exceeds > 5 mm between 20  and 30 weeks.  We reviewed that this marker is more common in males and is considered a benign variant in fetuses who are determined to have no other risk factors for fetal aneuploidy.  Since this finding can progress to congenital hydronephrosis, follow up ultrasounds are recommended.  [name] is scheduled to return for a follow-up ultrasound in 4 weeks.  We reviewed that Ms.Cindy Armstrong's risk to have a fetus with Down syndrome is ~1 in 54, based on her age of 23 y.o. at [redacted]w[redacted]d gestation.  Considering the combined ultrasound findings of an EIF and pyelectasis, the adjusted risk for fetal Down syndrome is ~1 in 190 (0.5%).  We reviewed chromosomes, nondisjunction, and the common features and variable prognosis of Down syndrome.  We also reviewed other aneuploidies including trisomies 13 and 16; however, we discussed that these conditions are less likely given that the remainder of the fetal anatomy was wnl by ultrasound.  We reviewed other available screening and diagnostic options including noninvasive prenatal screening (NIPS)/cell free DNA (cfDNA) testing and amniocentesis.  She was counseled regarding the benefits and limitations of each option.  We reviewed the approximate 1 in 109-323 risk for complications for amniocentesis, including spontaneous preterm labor. However, we discussed the risk for complications from amniocentesis would likely be increased for [name] in the current pregnancy given the ultrasound finding of shortened cervix. After consideration of all the options, she elected to proceed with NIPS (Panorama).  Those results will be available in ~8-10 days.    Both family histories were reviewed and found to be contributory for autism for the patient's female maternal first cousin (her maternal aunt's son). The underlying etiology is not known, but there was reportedly maternal drug use during his pregnancy.  We discussed that autism is part of the spectrum of conditions referred to  as Autistic spectrum disorders (ASD). We discussed that ASDs are among the most common neurodevelopmental disorders, with approximately 1 in 88 children meeting criteria for ASD. Approximately 80% of individuals diagnosed are female. There is strong evidence that genetic factors play a critical role in development of ASD. There have been recent advances in identifying specific genetic causes of ASD, however, there are still many individuals for whom the etiology of the ASD is not known. Once a family has a child with a diagnosis of ASD, there is a 13.5% chance to have another child with ASD. She understands that at this time there is not genetic testing available for ASD for most families. We reviewed fragile X syndrome and the X-linked inheritance of this condition, given that autism can be a feature of fragile X syndrome.  We discussed the option of FMR1 (the gene that when altered causes fragile X syndrome) analysis to determine whether Fragile X syndrome is the cause of intellectual disability in this family.  [name] declined FMR1 analysis today. We discussed that without more specific information, it is difficult to provide an accurate risk assessment.  Further genetic counseling is warranted if more information is obtained.  [name] denied exposure to environmental toxins or chemical agents. She denied the use of alcohol, tobacco or street drugs. She denied significant viral illnesses during the course of her pregnancy. Her medical and surgical histories were noncontributory.   I counseled [name] regarding the above risks and available options.  The approximate face-to-face time with the genetic counselor was 25 minutes.   Chipper Oman, MS Certified Genetic Counselor 09/06/2013

## 2013-09-06 NOTE — MAU Note (Signed)
Urine in lab 

## 2013-09-06 NOTE — MAU Note (Addendum)
Pt sent from MFM for FM due to ? Shortened cervix, ctxs noted on u/s and history of PTD. Denies pain but having some pelvic pressure

## 2013-09-06 NOTE — Progress Notes (Signed)
Up to BR.

## 2013-09-07 ENCOUNTER — Encounter: Payer: Self-pay | Admitting: Obstetrics & Gynecology

## 2013-09-07 DIAGNOSIS — O4700 False labor before 37 completed weeks of gestation, unspecified trimester: Secondary | ICD-10-CM | POA: Diagnosis not present

## 2013-09-07 DIAGNOSIS — O26872 Cervical shortening, second trimester: Secondary | ICD-10-CM | POA: Insufficient documentation

## 2013-09-07 DIAGNOSIS — O479 False labor, unspecified: Secondary | ICD-10-CM

## 2013-09-07 MED ORDER — NIFEDIPINE ER OSMOTIC RELEASE 30 MG PO TB24: 30.0000 mg | ORAL_TABLET | Freq: Two times a day (BID) | ORAL | Status: DC

## 2013-09-07 MED ORDER — BETAMETHASONE SOD PHOS & ACET 6 (3-3) MG/ML IJ SUSP
12.0000 mg | Freq: Once | INTRAMUSCULAR | Status: AC
  Administered 2013-09-07: 12 mg via INTRAMUSCULAR
  Filled 2013-09-07: qty 2

## 2013-09-07 MED ORDER — NIFEDIPINE 10 MG PO CAPS: 10.0000 mg | ORAL_CAPSULE | Freq: Four times a day (QID) | ORAL | Status: DC

## 2013-09-07 NOTE — Progress Notes (Signed)
Ur chart review completed.  

## 2013-09-07 NOTE — Discharge Summary (Signed)
Physician Discharge Summary  Patient ID: Cindy Armstrong MRN: [id number] DOB/AGE: [birth year] 23 y.o.  Admit date: 09/06/2013 Discharge date: 09/07/2013  Admission Diagnoses:preterm labor at 24.[redacted] weeks EGA  Discharge Diagnoses: same Active Problems:   Threatened preterm labor   Discharged Condition: good  Hospital Course: She was observed and treated with procardia. BMZ was given for fetal lung maturity. Her cramping subsided. Her cervix was re examined on the day of discharge and was found to be closed and relatively thick.  Consults: None  Significant Diagnostic Studies: none  Treatments: BMZ and procardia  Discharge Exam: Blood pressure 121/61, pulse 70, temperature 98 F (36.7 C), temperature source Oral, resp. rate 20, height 5\' 3"  (1.6 m), weight 57.607 kg (127 lb), last menstrual period 03/21/2013, not currently breastfeeding. General appearance: alert Resp: clear to auscultation bilaterally Cardio: regular rate and rhythm, S1, S2 normal, no murmur, click, rub or gallop Cervix- closed and thick  Disposition: 01-Home or Self Care     Medication List         acetaminophen 500 MG tablet  Commonly known as:  TYLENOL  Take 500 mg by mouth every 4 (four) hours as needed for moderate pain.     docusate sodium 100 MG capsule  Commonly known as:  COLACE  Take 100 mg by mouth 2 (two) times daily as needed for mild constipation.     NIFEdipine 10 MG capsule  Commonly known as:  PROCARDIA  Take 1 capsule (10 mg total) by mouth every 6 (six) hours.     ondansetron 8 MG tablet  Commonly known as:  ZOFRAN  Take 8 mg by mouth every 8 (eight) hours as needed for nausea or vomiting.     prenatal multivitamin Tabs tablet  Take 1 tablet by mouth daily at 12 noon.     promethazine 25 MG tablet  Commonly known as:  PHENERGAN  Take 25 mg by mouth every 6 (six) hours as needed for nausea or vomiting. Take 1/2 tablet as needed           Follow-up Information   Follow  up with [facility]. Schedule an appointment as soon as possible for a visit in 1 week.   Contact information:   [place] [address] [phone]      Signed: [name]. 09/07/2013, 8:01 AM

## 2013-09-07 NOTE — Discharge Instructions (Signed)
Preterm Labor Information Preterm labor is when labor starts at less than 37 weeks of pregnancy. The normal length of a pregnancy is 39 to 41 weeks. CAUSES Often, there is no identifiable underlying cause as to why a woman goes into preterm labor. One of the most common known causes of preterm labor is infection. Infections of the uterus, cervix, vagina, amniotic sac, bladder, kidney, or even the lungs (pneumonia) can cause labor to start. Other suspected causes of preterm labor include:   Urogenital infections, such as yeast infections and bacterial vaginosis.   Uterine abnormalities (uterine shape, uterine septum, fibroids, or bleeding from the placenta).   A cervix that has been operated on (it may fail to stay closed).   Malformations in the fetus.   Multiple gestations (twins, triplets, and so on).   Breakage of the amniotic sac.  RISK FACTORS  Having a previous history of preterm labor.   Having premature rupture of membranes (PROM).  Having a plaFetal Movement Counts Patient Name: __________________________________________________ Patient Due Date: ____________________ Performing a fetal movement count is highly recommended in high-risk pregnancies, but it is good for every pregnant woman to do. Your health care provider may ask you to start counting fetal movements at 28 weeks of the pregnancy. Fetal movements often increase:  After eating a full meal.  After physical activity.  After eating or drinking something sweet or cold.  At rest. Pay attention to when you feel the baby is most active. This will help you notice a pattern of your baby's sleep and wake cycles and what factors contribute to an increase in fetal movement. It is important to perform a fetal movement count at the same time each day when your baby is normally most active.  HOW TO COUNT FETAL MOVEMENTS 1. Find a quiet and comfortable area to sit or lie down on your left side. Lying on your left side  provides the best blood and oxygen circulation to your baby. 2. Write down the day and time on a sheet of paper or in a journal. 3. Start counting kicks, flutters, swishes, rolls, or jabs in a 2-hour period. You should feel at least 10 movements within 2 hours. 4. If you do not feel 10 movements in 2 hours, wait 2-3 hours and count again. Look for a change in the pattern or not enough counts in 2 hours. SEEK MEDICAL CARE IF:  You feel less than 10 counts in 2 hours, tried twice.  There is no movement in over an hour.  The pattern is changing or taking longer each day to reach 10 counts in 2 hours.  You feel the baby is not moving as he or she usually does. Date: ____________ Movements: ____________ Start time: ____________ Cindy Armstrong time: ____________  Date: ____________ Movements: ____________ Start time: ____________ Cindy Armstrong time: ____________ Date: ____________ Movements: ____________ Start time: ____________ Cindy Armstrong time: ____________ Date: ____________ Movements: ____________ Start time: ____________ Cindy Armstrong time: ____________ Date: ____________ Movements: ____________ Start time: ____________ Cindy Armstrong time: ____________ Date: ____________ Movements: ____________ Start time: ____________ Cindy Armstrong time: ____________ Date: ____________ Movements: ____________ Start time: ____________ Cindy Armstrong time: ____________ Date: ____________ Movements: ____________ Start time: ____________ Cindy Armstrong time: ____________  Date: ____________ Movements: ____________ Start time: ____________ Cindy Armstrong time: ____________ Date: ____________ Movements: ____________ Start time: ____________ Cindy Armstrong time: ____________ Date: ____________ Movements: ____________ Start time: ____________ Cindy Armstrong time: ____________ Date: ____________ Movements: ____________ Start time: ____________ Cindy Armstrong time: ____________ Date: ____________ Movements: ____________ Start time: ____________ Cindy Armstrong time: ____________ Date: ____________ Movements:  ____________  Start time: ____________ Cindy Armstrong time: ____________ Date: ____________ Movements: ____________ Start time: ____________ Cindy Armstrong time: ____________  Date: ____________ Movements: ____________ Start time: ____________ Cindy Armstrong time: ____________ Date: ____________ Movements: ____________ Start time: ____________ Cindy Armstrong time: ____________ Date: ____________ Movements: ____________ Start time: ____________ Cindy Armstrong time: ____________ Date: ____________ Movements: ____________ Start time: ____________ Cindy Armstrong time: ____________ Date: ____________ Movements: ____________ Start time: ____________ Cindy Armstrong time: ____________ Date: ____________ Movements: ____________ Start time: ____________ Cindy Armstrong time: ____________ Date: ____________ Movements: ____________ Start time: ____________ Cindy Armstrong time: ____________  Date: ____________ Movements: ____________ Start time: ____________ Cindy Armstrong time: ____________ Date: ____________ Movements: ____________ Start time: ____________ Cindy Armstrong time: ____________ Date: ____________ Movements: ____________ Start time: ____________ Cindy Armstrong time: ____________ Date: ____________ Movements: ____________ Start time: ____________ Cindy Armstrong time: ____________ Date: ____________ Movements: ____________ Start time: ____________ Cindy Armstrong time: ____________ Date: ____________ Movements: ____________ Start time: ____________ Cindy Armstrong time: ____________ Date: ____________ Movements: ____________ Start time: ____________ Cindy Armstrong time: ____________  Date: ____________ Movements: ____________ Start time: ____________ Cindy Armstrong time: ____________ Date: ____________ Movements: ____________ Start time: ____________ Cindy Armstrong time: ____________ Date: ____________ Movements: ____________ Start time: ____________ Cindy Armstrong time: ____________ Date: ____________ Movements: ____________ Start time: ____________ Cindy Armstrong time: ____________ Date: ____________ Movements: ____________ Start time: ____________ Cindy Armstrong  time: ____________ Date: ____________ Movements: ____________ Start time: ____________ Cindy Armstrong time: ____________ Date: ____________ Movements: ____________ Start time: ____________ Cindy Armstrong time: ____________  Date: ____________ Movements: ____________ Start time: ____________ Cindy Armstrong time: ____________ Date: ____________ Movements: ____________ Start time: ____________ Cindy Armstrong time: ____________ Date: ____________ Movements: ____________ Start time: ____________ Cindy Armstrong time: ____________ Date: ____________ Movements: ____________ Start time: ____________ Cindy Armstrong time: ____________ Date: ____________ Movements: ____________ Start time: ____________ Cindy Armstrong time: ____________ Date: ____________ Movements: ____________ Start time: ____________ Cindy Armstrong time: ____________ Date: ____________ Movements: ____________ Start time: ____________ Cindy Armstrong time: ____________  Date: ____________ Movements: ____________ Start time: ____________ Cindy Armstrong time: ____________ Date: ____________ Movements: ____________ Start time: ____________ Cindy Armstrong time: ____________ Date: ____________ Movements: ____________ Start time: ____________ Cindy Armstrong time: ____________ Date: ____________ Movements: ____________ Start time: ____________ Cindy Armstrong time: ____________ Date: ____________ Movements: ____________ Start time: ____________ Cindy Armstrong time: ____________ Date: ____________ Movements: ____________ Start time: ____________ Cindy Armstrong time: ____________ Date: ____________ Movements: ____________ Start time: ____________ Cindy Armstrong time: ____________  Date: ____________ Movements: ____________ Start time: ____________ Cindy Armstrong time: ____________ Date: ____________ Movements: ____________ Start time: ____________ Cindy Armstrong time: ____________ Date: ____________ Movements: ____________ Start time: ____________ Cindy Armstrong time: ____________ Date: ____________ Movements: ____________ Start time: ____________ Cindy Armstrong time: ____________ Date: ____________  Movements: ____________ Start time: ____________ Cindy Armstrong time: ____________ Date: ____________ Movements: ____________ Start time: ____________ Cindy Armstrong time: ____________ Document Released: 02/26/2006 Document Revised: 06/13/2013 Document Reviewed: 11/24/2011 [employer] Patient Information 2015 [employer]. This information is not intended to replace advice given to you by your health care provider. Make sure you discuss any questions you have with your health care provider.  centa that covers the opening of the cervix (placenta previa).   Having a placenta that separates from the uterus (placental abruption).   Having a cervix that is too weak to hold the fetus in the uterus (incompetent cervix).   Having too much fluid in the amniotic sac (polyhydramnios).   Taking illegal drugs or smoking while pregnant.   Not gaining enough weight while pregnant.   Being younger than 50 and older than 22 years old.   Having a low socioeconomic status.   Being African American. SYMPTOMS Signs and symptoms of preterm labor include:   Menstrual-like cramps, abdominal pain, or back pain.  Uterine contractions that are regular, as frequent as six in an  hour, regardless of their intensity (may be mild or painful).  Contractions that start on the top of the uterus and spread down to the lower abdomen and back.   A sense of increased pelvic pressure.   A watery or bloody mucus discharge that comes from the vagina.  TREATMENT Depending on the length of the pregnancy and other circumstances, your health care provider may suggest bed rest. If necessary, there are medicines that can be given to stop contractions and to mature the fetal lungs. If labor happens before 34 weeks of pregnancy, a prolonged hospital stay may be recommended. Treatment depends on the condition of both you and the fetus.  WHAT SHOULD YOU DO IF YOU THINK YOU ARE IN PRETERM LABOR? Call your health care provider right  away. You will need to go to the hospital to get checked immediately. HOW CAN YOU PREVENT PRETERM LABOR IN FUTURE PREGNANCIES? You should:   Stop smoking if you smoke.  Maintain healthy weight gain and avoid chemicals and drugs that are not necessary.  Be watchful for any type of infection.  Inform your health care provider if you have a known history of preterm labor. Document Released: 04/19/2003 Document Revised: 09/29/2012 Document Reviewed: 03/01/2012 [facility] Patient Information 2015 [place], [place]. This information is not intended to replace advice given to you by your health care provider. Make sure you discuss any questions you have with your health care provider.

## 2013-09-08 ENCOUNTER — Encounter: Payer: Self-pay | Admitting: Obstetrics & Gynecology

## 2013-09-08 ENCOUNTER — Ambulatory Visit (INDEPENDENT_AMBULATORY_CARE_PROVIDER_SITE_OTHER): Payer: Medicaid Other

## 2013-09-08 VITALS — BP 125/67 | HR 68 | Temp 98.3°F | Wt 123.3 lb

## 2013-09-08 DIAGNOSIS — O09219 Supervision of pregnancy with history of pre-term labor, unspecified trimester: Secondary | ICD-10-CM

## 2013-09-08 DIAGNOSIS — O99820 Streptococcus B carrier state complicating pregnancy: Secondary | ICD-10-CM | POA: Insufficient documentation

## 2013-09-08 DIAGNOSIS — O26872 Cervical shortening, second trimester: Secondary | ICD-10-CM

## 2013-09-08 DIAGNOSIS — O26879 Cervical shortening, unspecified trimester: Secondary | ICD-10-CM

## 2013-09-08 LAB — CULTURE, BETA STREP (GROUP B ONLY)

## 2013-09-08 NOTE — Progress Notes (Signed)
Patient here today for dose of 17P. 77ml 17P injection to LUO quadrant of buttocks. Patient tolerated well. Faith Rogue is now out. Called [facility] at [phone] and spoke to pharmacist rep to request refill--- was told they would get it shipped out to [place]. Patient to return in one week for next dose.

## 2013-09-13 ENCOUNTER — Telehealth (HOSPITAL_COMMUNITY): Payer: Self-pay | Admitting: MS"

## 2013-09-13 NOTE — Telephone Encounter (Signed)
Called [name] to discuss her cell free DNA test results.  Mrs. Cindy Armstrong had Panorama testing through [facility].  Testing was offered because of ultrasound findings.   The patient was identified by name and DOB.  We reviewed that these are within normal limits, showing a less than 1 in 10,000 risk for trisomies 21, 18 and 13, and monosomy X (Turner syndrome).  In addition, the risk for triploidy/vanishing twin and sex chromosome trisomies (47,XXX and 47,XXY) was also low risk.  [name] elected to have cffDNA analysis for 22q11 deletion syndrome, which was also low risk (<1 in 3330).  We reviewed that this testing identifies > 99% of pregnancies with trisomy 50, trisomy 51, sex chromosome trisomies (47,XXX and 47,XXY), and triploidy. The detection rate for trisomy 18 is 96%.  The detection rate for monosomy X is ~92%.  The false positive rate is <0.1% for all conditions. Testing was also consistent with female fetal sex.  She understands that this testing does not identify all genetic conditions.  All questions were answered to her satisfaction, she was encouraged to call with additional questions or concerns.  Chipper Oman, MS Certified Genetic Counselor 09/13/2013 3:51 PM

## 2013-09-13 NOTE — H&P (Signed)
Pt seen and examined.  Cervix was very posterior but one finger could be passed through internal os.  Amniotic sac felt but no presenting part.  Will admit for steroids and monitoring. Aidah Forquer,Kirstine H.

## 2013-09-15 ENCOUNTER — Ambulatory Visit (INDEPENDENT_AMBULATORY_CARE_PROVIDER_SITE_OTHER): Payer: Medicaid Other | Admitting: Obstetrics and Gynecology

## 2013-09-15 ENCOUNTER — Ambulatory Visit: Payer: Medicaid Other

## 2013-09-15 VITALS — BP 129/62 | HR 83 | Wt 124.7 lb

## 2013-09-15 DIAGNOSIS — O26879 Cervical shortening, unspecified trimester: Secondary | ICD-10-CM

## 2013-09-15 DIAGNOSIS — O09219 Supervision of pregnancy with history of pre-term labor, unspecified trimester: Secondary | ICD-10-CM

## 2013-09-15 DIAGNOSIS — O26872 Cervical shortening, second trimester: Secondary | ICD-10-CM

## 2013-09-15 LAB — POCT URINALYSIS DIP (DEVICE)
Bilirubin Urine: NEGATIVE
Glucose, UA: NEGATIVE mg/dL
Hgb urine dipstick: NEGATIVE
Ketones, ur: NEGATIVE mg/dL
LEUKOCYTES UA: NEGATIVE
NITRITE: NEGATIVE
PH: 6.5 (ref 5.0–8.0)
Protein, ur: NEGATIVE mg/dL
Specific Gravity, Urine: 1.02 (ref 1.005–1.030)
Urobilinogen, UA: 0.2 mg/dL (ref 0.0–1.0)

## 2013-09-15 NOTE — Patient Instructions (Signed)

## 2013-09-15 NOTE — Progress Notes (Signed)
Patient reports a lot of back and abdominal pain.

## 2013-09-15 NOTE — Progress Notes (Signed)
Doing well. No abdominal pain today. Had some contractions radiating to low back a few nights ago. No Dysuria, urgency, frequency.  Following pelvic rest. Neg CVAT.  EIF and bil fetal renal pyelectasis. Had genetic counseling and NIPS neg. Has F/U US scheduled with MFM.

## 2013-09-22 ENCOUNTER — Ambulatory Visit (INDEPENDENT_AMBULATORY_CARE_PROVIDER_SITE_OTHER): Payer: Medicaid Other | Admitting: Obstetrics & Gynecology

## 2013-09-22 ENCOUNTER — Other Ambulatory Visit: Payer: Self-pay | Admitting: Obstetrics & Gynecology

## 2013-09-22 ENCOUNTER — Ambulatory Visit: Payer: Medicaid Other

## 2013-09-22 VITALS — BP 116/50 | HR 70 | Temp 98.0°F | Wt 125.6 lb

## 2013-09-22 DIAGNOSIS — O26872 Cervical shortening, second trimester: Secondary | ICD-10-CM

## 2013-09-22 DIAGNOSIS — O26879 Cervical shortening, unspecified trimester: Secondary | ICD-10-CM

## 2013-09-22 DIAGNOSIS — O09213 Supervision of pregnancy with history of pre-term labor, third trimester: Secondary | ICD-10-CM

## 2013-09-22 LAB — POCT URINALYSIS DIP (DEVICE)
Bilirubin Urine: NEGATIVE
Glucose, UA: NEGATIVE mg/dL
Hgb urine dipstick: NEGATIVE
Ketones, ur: NEGATIVE mg/dL
Nitrite: NEGATIVE
PH: 6.5 (ref 5.0–8.0)
PROTEIN: NEGATIVE mg/dL
Specific Gravity, Urine: 1.015 (ref 1.005–1.030)
UROBILINOGEN UA: 0.2 mg/dL (ref 0.0–1.0)

## 2013-09-22 NOTE — Progress Notes (Signed)
Pt is concerned about sharp pain in abdomen at night and now throughout the day.  Pt desires to do 28 wk labs next week when she comes for 17P injection.

## 2013-09-22 NOTE — Progress Notes (Signed)
Right side pain, exam benign. appt for f/u US in 10 days

## 2013-09-22 NOTE — Patient Instructions (Signed)

## 2013-09-29 ENCOUNTER — Encounter: Payer: Self-pay | Admitting: Family Medicine

## 2013-09-29 ENCOUNTER — Encounter: Payer: Medicaid Other | Admitting: Family Medicine

## 2013-09-30 ENCOUNTER — Encounter: Payer: Self-pay | Admitting: General Practice

## 2013-10-04 ENCOUNTER — Ambulatory Visit (HOSPITAL_COMMUNITY): Payer: Medicaid Other

## 2013-10-06 ENCOUNTER — Ambulatory Visit (HOSPITAL_COMMUNITY)
Admission: RE | Admit: 2013-10-06 | Discharge: 2013-10-06 | Disposition: A | Discharge status: Home / Self Care | Payer: Medicaid Other | Source: Ambulatory Visit | Attending: Obstetrics & Gynecology | Admitting: Obstetrics & Gynecology

## 2013-10-06 ENCOUNTER — Other Ambulatory Visit: Payer: Self-pay | Admitting: Obstetrics & Gynecology

## 2013-10-06 ENCOUNTER — Ambulatory Visit (INDEPENDENT_AMBULATORY_CARE_PROVIDER_SITE_OTHER): Payer: Medicaid Other | Admitting: Family

## 2013-10-06 VITALS — BP 123/60 | HR 76 | Wt 128.5 lb

## 2013-10-06 VITALS — BP 120/57 | HR 82 | Temp 97.8°F | Wt 128.5 lb

## 2013-10-06 DIAGNOSIS — Z3689 Encounter for other specified antenatal screening: Secondary | ICD-10-CM | POA: Diagnosis not present

## 2013-10-06 DIAGNOSIS — Z23 Encounter for immunization: Secondary | ICD-10-CM

## 2013-10-06 DIAGNOSIS — O09219 Supervision of pregnancy with history of pre-term labor, unspecified trimester: Secondary | ICD-10-CM | POA: Insufficient documentation

## 2013-10-06 DIAGNOSIS — N12 Tubulo-interstitial nephritis, not specified as acute or chronic: Secondary | ICD-10-CM

## 2013-10-06 DIAGNOSIS — O26872 Cervical shortening, second trimester: Secondary | ICD-10-CM

## 2013-10-06 DIAGNOSIS — O358XX0 Maternal care for other (suspected) fetal abnormality and damage, not applicable or unspecified: Secondary | ICD-10-CM

## 2013-10-06 DIAGNOSIS — O09213 Supervision of pregnancy with history of pre-term labor, third trimester: Secondary | ICD-10-CM

## 2013-10-06 DIAGNOSIS — IMO0001 Reserved for inherently not codable concepts without codable children: Secondary | ICD-10-CM

## 2013-10-06 DIAGNOSIS — O3090 Multiple gestation, unspecified, unspecified trimester: Secondary | ICD-10-CM

## 2013-10-06 DIAGNOSIS — O358XX1 Maternal care for other (suspected) fetal abnormality and damage, fetus 1: Secondary | ICD-10-CM

## 2013-10-06 DIAGNOSIS — O2302 Infections of kidney in pregnancy, second trimester: Secondary | ICD-10-CM

## 2013-10-06 DIAGNOSIS — O4702 False labor before 37 completed weeks of gestation, second trimester: Secondary | ICD-10-CM

## 2013-10-06 DIAGNOSIS — O4700 False labor before 37 completed weeks of gestation, unspecified trimester: Secondary | ICD-10-CM

## 2013-10-06 DIAGNOSIS — O2390 Unspecified genitourinary tract infection in pregnancy, unspecified trimester: Secondary | ICD-10-CM

## 2013-10-06 LAB — CBC
HCT: 31.9 % — ABNORMAL LOW (ref 36.0–46.0)
Hemoglobin: 10.9 g/dL — ABNORMAL LOW (ref 12.0–15.0)
MCH: 30.5 pg (ref 26.0–34.0)
MCHC: 34.2 g/dL (ref 30.0–36.0)
MCV: 89.4 fL (ref 78.0–100.0)
Platelets: 190 10*3/uL (ref 150–400)
RBC: 3.57 MIL/uL — ABNORMAL LOW (ref 3.87–5.11)
RDW: 14.2 % (ref 11.5–15.5)
WBC: 7.9 10*3/uL (ref 4.0–10.5)

## 2013-10-06 LAB — POCT URINALYSIS DIP (DEVICE)
Bilirubin Urine: NEGATIVE
Glucose, UA: NEGATIVE mg/dL
Hgb urine dipstick: NEGATIVE
KETONES UR: NEGATIVE mg/dL
Nitrite: NEGATIVE
PH: 7 (ref 5.0–8.0)
Protein, ur: NEGATIVE mg/dL
Specific Gravity, Urine: 1.02 (ref 1.005–1.030)
Urobilinogen, UA: 0.2 mg/dL (ref 0.0–1.0)

## 2013-10-06 MED ORDER — TETANUS-DIPHTH-ACELL PERTUSSIS 5-2.5-18.5 LF-MCG/0.5 IM SUSP: 0.5000 mL | Freq: Once | INTRAMUSCULAR | Status: DC

## 2013-10-06 NOTE — Progress Notes (Signed)
Ultrasound scheduled 10/06/13 @ 7048G.

## 2013-10-06 NOTE — Progress Notes (Signed)
C/o still having a lot of pressure and pelvic pain, but no change since last visit. C/o contractions more at night.

## 2013-10-06 NOTE — Progress Notes (Signed)
28 wk labs today; 17p today.  Increased pressure in upper abdomen with fetal movement.  No vaginal bleeding or lof.  Reports 2-3 contractions at night.  Rescheduled missed follow-up anatomy ultrasound.

## 2013-10-07 ENCOUNTER — Encounter: Payer: Self-pay | Admitting: General Practice

## 2013-10-07 LAB — HIV ANTIBODY (ROUTINE TESTING W REFLEX): HIV: NONREACTIVE

## 2013-10-07 LAB — GLUCOSE TOLERANCE, 3 HOURS
GLUCOSE 3 HOUR GTT: 70 mg/dL (ref 70–144)
GLUCOSE, FASTING-GESTATIONAL: 73 mg/dL (ref 70–104)
Glucose Tolerance, 1 hour: 138 mg/dL (ref 70–189)
Glucose Tolerance, 2 hour: 64 mg/dL — ABNORMAL LOW (ref 70–164)

## 2013-10-07 LAB — RPR

## 2013-10-09 ENCOUNTER — Encounter (HOSPITAL_COMMUNITY): Payer: Self-pay | Admitting: *Deleted

## 2013-10-09 ENCOUNTER — Inpatient Hospital Stay (HOSPITAL_COMMUNITY)
Admission: AD | Admit: 2013-10-09 | Discharge: 2013-10-12 | DRG: 778 | Disposition: A | Discharge status: Home / Self Care | Payer: Medicaid Other | Source: Ambulatory Visit | Attending: Obstetrics & Gynecology | Admitting: Obstetrics & Gynecology

## 2013-10-09 DIAGNOSIS — O26879 Cervical shortening, unspecified trimester: Secondary | ICD-10-CM | POA: Diagnosis present

## 2013-10-09 DIAGNOSIS — O09212 Supervision of pregnancy with history of pre-term labor, second trimester: Secondary | ICD-10-CM

## 2013-10-09 DIAGNOSIS — O283 Abnormal ultrasonic finding on antenatal screening of mother: Secondary | ICD-10-CM

## 2013-10-09 DIAGNOSIS — O99330 Smoking (tobacco) complicating pregnancy, unspecified trimester: Secondary | ICD-10-CM | POA: Diagnosis present

## 2013-10-09 DIAGNOSIS — O99891 Other specified diseases and conditions complicating pregnancy: Secondary | ICD-10-CM | POA: Diagnosis present

## 2013-10-09 DIAGNOSIS — Z22330 Carrier of Group B streptococcus: Secondary | ICD-10-CM

## 2013-10-09 DIAGNOSIS — Z8751 Personal history of pre-term labor: Secondary | ICD-10-CM

## 2013-10-09 DIAGNOSIS — O9989 Other specified diseases and conditions complicating pregnancy, childbirth and the puerperium: Secondary | ICD-10-CM

## 2013-10-09 DIAGNOSIS — O26872 Cervical shortening, second trimester: Secondary | ICD-10-CM

## 2013-10-09 DIAGNOSIS — Z833 Family history of diabetes mellitus: Secondary | ICD-10-CM | POA: Diagnosis not present

## 2013-10-09 DIAGNOSIS — O09213 Supervision of pregnancy with history of pre-term labor, third trimester: Secondary | ICD-10-CM

## 2013-10-09 DIAGNOSIS — O4700 False labor before 37 completed weeks of gestation, unspecified trimester: Secondary | ICD-10-CM | POA: Diagnosis present

## 2013-10-09 LAB — CBC
HEMATOCRIT: 31.7 % — AB (ref 36.0–46.0)
Hemoglobin: 11 g/dL — ABNORMAL LOW (ref 12.0–15.0)
MCH: 30.9 pg (ref 26.0–34.0)
MCHC: 34.7 g/dL (ref 30.0–36.0)
MCV: 89 fL (ref 78.0–100.0)
Platelets: 179 10*3/uL (ref 150–400)
RBC: 3.56 MIL/uL — AB (ref 3.87–5.11)
RDW: 13.6 % (ref 11.5–15.5)
WBC: 8.3 10*3/uL (ref 4.0–10.5)

## 2013-10-09 LAB — URINE MICROSCOPIC-ADD ON

## 2013-10-09 LAB — URINALYSIS, ROUTINE W REFLEX MICROSCOPIC
Bilirubin Urine: NEGATIVE
GLUCOSE, UA: NEGATIVE mg/dL
Hgb urine dipstick: NEGATIVE
Ketones, ur: 15 mg/dL — AB
Nitrite: NEGATIVE
PH: 7 (ref 5.0–8.0)
Protein, ur: NEGATIVE mg/dL
Specific Gravity, Urine: 1.02 (ref 1.005–1.030)
Urobilinogen, UA: 0.2 mg/dL (ref 0.0–1.0)

## 2013-10-09 LAB — WET PREP, GENITAL
Trich, Wet Prep: NONE SEEN
Yeast Wet Prep HPF POC: NONE SEEN

## 2013-10-09 LAB — FETAL FIBRONECTIN: Fetal Fibronectin: POSITIVE — AB

## 2013-10-09 MED ORDER — MAGNESIUM SULFATE BOLUS VIA INFUSION
4.0000 g | Freq: Once | INTRAVENOUS | Status: AC
  Administered 2013-10-09: 4 g via INTRAVENOUS
  Filled 2013-10-09: qty 500

## 2013-10-09 MED ORDER — ACETAMINOPHEN 325 MG PO TABS
650.0000 mg | ORAL_TABLET | ORAL | Status: DC | PRN
Start: 2013-10-09 — End: 2013-10-12
  Administered 2013-10-11: 650 mg via ORAL
  Filled 2013-10-09: qty 2

## 2013-10-09 MED ORDER — ZOLPIDEM TARTRATE 5 MG PO TABS: 5.0000 mg | ORAL_TABLET | Freq: Every evening | ORAL | Status: DC | PRN

## 2013-10-09 MED ORDER — BETAMETHASONE SOD PHOS & ACET 6 (3-3) MG/ML IJ SUSP
12.0000 mg | Freq: Once | INTRAMUSCULAR | Status: AC
  Administered 2013-10-10: 12 mg via INTRAMUSCULAR
  Filled 2013-10-09: qty 2

## 2013-10-09 MED ORDER — CALCIUM CARBONATE ANTACID 500 MG PO CHEW
2.0000 | CHEWABLE_TABLET | ORAL | Status: DC | PRN
  Administered 2013-10-11: 400 mg via ORAL
  Filled 2013-10-09: qty 1

## 2013-10-09 MED ORDER — BETAMETHASONE SOD PHOS & ACET 6 (3-3) MG/ML IJ SUSP
12.0000 mg | Freq: Once | INTRAMUSCULAR | Status: AC
  Administered 2013-10-09: 12 mg via INTRAMUSCULAR
  Filled 2013-10-09: qty 2

## 2013-10-09 MED ORDER — LACTATED RINGERS IV BOLUS (SEPSIS)
1000.0000 mL | Freq: Once | INTRAVENOUS | Status: AC
  Administered 2013-10-09: 1000 mL via INTRAVENOUS

## 2013-10-09 MED ORDER — PENICILLIN G POTASSIUM 5000000 UNITS IJ SOLR
5.0000 10*6.[IU] | Freq: Once | INTRAMUSCULAR | Status: AC
  Administered 2013-10-09: 5 10*6.[IU] via INTRAVENOUS
  Filled 2013-10-09: qty 5

## 2013-10-09 MED ORDER — FENTANYL CITRATE 0.05 MG/ML IJ SOLN
100.0000 ug | INTRAMUSCULAR | Status: AC
  Administered 2013-10-09: 100 ug via INTRAVENOUS
  Filled 2013-10-09: qty 2

## 2013-10-09 MED ORDER — PENICILLIN G POTASSIUM 5000000 UNITS IJ SOLR
2.5000 10*6.[IU] | INTRAVENOUS | Status: DC
  Administered 2013-10-09 – 2013-10-11 (×9): 2.5 10*6.[IU] via INTRAVENOUS
  Filled 2013-10-09 (×12): qty 2.5

## 2013-10-09 MED ORDER — MAGNESIUM SULFATE 40 G IN LACTATED RINGERS - SIMPLE
2.0000 g/h | INTRAVENOUS | Status: DC
  Administered 2013-10-09 – 2013-10-10 (×2): 2 g/h via INTRAVENOUS
  Filled 2013-10-09 (×2): qty 500

## 2013-10-09 MED ORDER — PRENATAL MULTIVITAMIN CH
1.0000 | ORAL_TABLET | Freq: Every day | ORAL | Status: DC
  Administered 2013-10-09 – 2013-10-12 (×4): 1 via ORAL
  Filled 2013-10-09 (×4): qty 1

## 2013-10-09 MED ORDER — LACTATED RINGERS IV SOLN
INTRAVENOUS | Status: DC
  Administered 2013-10-09 – 2013-10-10 (×3): via INTRAVENOUS

## 2013-10-09 MED ORDER — DOCUSATE SODIUM 100 MG PO CAPS
100.0000 mg | ORAL_CAPSULE | Freq: Every day | ORAL | Status: DC
  Administered 2013-10-09 – 2013-10-12 (×4): 100 mg via ORAL
  Filled 2013-10-09 (×4): qty 1

## 2013-10-09 NOTE — H&P (Signed)
Cindy Armstrong is a 23 y.o. female G2P0101  presenting for preterm contractions.  She has hx of preterm labor with preterm delivery at 14 weeks with previous pregnancy.  She reports good fetal movement, denies LOF, vaginal bleeding, vaginal itching/burning, urinary symptoms, h/a, dizziness, n/v, or fever/chills.    Maternal Medical History:  Reason for admission: Nausea.    OB History   Grav Para Term Preterm Abortions TAB SAB Ect Mult Living   2 1 0 1 0 0 0 0 0 1      Past Medical History  Diagnosis Date  . No pertinent past medical history   . Preterm labor    Past Surgical History  Procedure Laterality Date  . No past surgeries     Family History: family history includes Diabetes in her mother; Drug abuse in her mother; Learning disabilities in her brother. There is no history of Alcohol abuse, Arthritis, Asthma, Birth defects, Cancer, COPD, Depression, Early death, Hearing loss, Heart disease, Hyperlipidemia, Hypertension, Kidney disease, Mental illness, Mental retardation, Miscarriages / Stillbirths, Stroke, Vision loss, or Other. Social History:  reports that she has been smoking Cigarettes.  She has a .75 pack-year smoking history. She has never used smokeless tobacco. She reports that she does not drink alcohol or use illicit drugs.   Prenatal Transfer Tool  Maternal Diabetes: No Genetic Screening: Normal NIPS,abnl nuchal fold Maternal Ultrasounds/Referrals: Normal Fetal Ultrasounds or other Referrals:  Other: [facility], dilated renal collecting system Maternal Substance Abuse:  No Significant Maternal Medications:  17-P Significant Maternal Lab Results:  None Other Comments:  None  Review of Systems  Constitutional: Negative for fever, chills and malaise/fatigue.  Eyes: Negative for blurred vision.  Respiratory: Negative for cough and shortness of breath.   Cardiovascular: Negative for chest pain.  Gastrointestinal: Positive for abdominal pain. Negative for heartburn,  nausea and vomiting.  Genitourinary: Negative for dysuria, urgency and frequency.  Musculoskeletal: Negative.   Neurological: Negative for dizziness and headaches.  Psychiatric/Behavioral: Negative for depression.    Dilation: 4 Effacement (%): 50 Station: Ballotable Exam by:: L. Leftwich-Kirby, CNM Blood pressure 114/67, pulse 82, temperature 98 F (36.7 C), temperature source Oral, resp. rate 18, height 5\' 1"  (1.549 m), weight 57.607 kg (127 lb), last menstrual period 03/21/2013, not currently breastfeeding. Maternal Exam:  Uterine Assessment: Contraction strength is moderate.  Contraction duration is 60 seconds. Contraction frequency is regular.   Abdomen: Patient reports no abdominal tenderness. Fetal presentation: vertex  Cervix: Cervix evaluated by digital exam.     Fetal Exam Fetal Monitor Review: Mode: ultrasound.   Baseline rate: 130.  Variability: moderate (6-25 bpm).   Pattern: accelerations present.    Fetal State Assessment: Category I - tracings are normal.     Physical Exam  Nursing note and vitals reviewed. Constitutional: She is oriented to person, place, and time. She appears well-developed and well-nourished.  Neck: Normal range of motion.  Cardiovascular: Normal rate and regular rhythm.   Respiratory: Effort normal and breath sounds normal.  GI: Soft.  Musculoskeletal: Normal range of motion.  Neurological: She is alert and oriented to person, place, and time.  Skin: Skin is warm and dry.  Psychiatric: She has a normal mood and affect. Her behavior is normal. Judgment and thought content normal.    Prenatal labs: ABO, Rh: O/POS/-- (05/05 0955) Antibody: NEG (05/05 0955) Rubella: 1.64 (05/05 0955) RPR: NON REAC (08/27 1430)  HBsAg: NEGATIVE (05/05 0955)  HIV: NONREACTIVE (08/27 1430)  GBS:     Assessment/Plan: Preterm  labor and PCD, PCE with history of preterm birth and short cervix. Admit for tocolysis and repeat betamethasone injection     LEFTWICH-KIRBY, LISA 10/09/2013, 1:14 PM

## 2013-10-09 NOTE — MAU Note (Signed)
Contractions started about 2 days ago, getting worse, denies LOF/VB.  Last intercourse was a week ago.

## 2013-10-09 NOTE — MAU Provider Note (Signed)
Chief Complaint:  Contractions   First Provider Initiated Contact with Patient 10/09/13 1152      HPI: [name] is a 23 y.o. G2P0101 at [redacted]w[redacted]d pt of [facility] who presents to maternity admissions reporting painful contractions every 5 minutes. She has hx of PPROM and delivery at 34w5 days with previous pregnancy and feels like she is in labor today.  She is crying in MAU, breathing with some contractions.  She reports good fetal movement, denies LOF, vaginal bleeding, vaginal itching/burning, urinary symptoms, h/a, dizziness, n/v, or fever/chills.     Past Medical History: Past Medical History  Diagnosis Date  . No pertinent past medical history   . Preterm labor     Past obstetric history: OB History  Gravida Para Term Preterm AB SAB TAB Ectopic Multiple Living  2 1 0 1 0 0 0 0 0 1     # Outcome Date GA Lbr Len/2nd Weight Sex Delivery Anes PTL Lv  2 CUR           1 PRE 01/08/12 [redacted]w[redacted]d 04:43 / 00:21 2.06 kg (4 lb 8.7 oz) M SVD EPI  Y     Comments: preterm      Past Surgical History: Past Surgical History  Procedure Laterality Date  . No past surgeries      Family History: Family History  Problem Relation Age of Onset  . Alcohol abuse Neg Hx   . Arthritis Neg Hx   . Asthma Neg Hx   . Birth defects Neg Hx   . Cancer Neg Hx   . COPD Neg Hx   . Depression Neg Hx   . Early death Neg Hx   . Hearing loss Neg Hx   . Heart disease Neg Hx   . Hyperlipidemia Neg Hx   . Hypertension Neg Hx   . Kidney disease Neg Hx   . Mental illness Neg Hx   . Mental retardation Neg Hx   . Miscarriages / Stillbirths Neg Hx   . Stroke Neg Hx   . Vision loss Neg Hx   . Other Neg Hx   . Diabetes Mother   . Drug abuse Mother   . Learning disabilities Brother     Social History: History  Substance Use Topics  . Smoking status: Light Tobacco Smoker -- 0.25 packs/day for 3 years    Types: Cigarettes  . Smokeless tobacco: Never Used  . Alcohol Use: No    Allergies: No Known  Allergies  Meds:  Facility-administered medications prior to admission  Medication Dose Route Frequency Provider Last Rate Last Dose  . hydroxyprogesterone caproate (DELALUTIN) 250 mg/mL injection 250 mg  250 mg Intramuscular Weekly Peggy Constant, MD   250 mg at 10/06/13 0843  . Tdap (BOOSTRIX) injection 0.5 mL  0.5 mL Intramuscular Once Walidah N Karim, CNM       Prescriptions prior to admission  Medication Sig Dispense Refill  . acetaminophen (TYLENOL) 500 MG tablet Take 500 mg by mouth every 4 (four) hours as needed for moderate pain.      Marland Kitchen docusate sodium (COLACE) 100 MG capsule Take 100 mg by mouth 2 (two) times daily as needed for mild constipation.      Marland Kitchen NIFEdipine (PROCARDIA XL) 30 MG 24 hr tablet Take 1 tablet (30 mg total) by mouth 2 (two) times daily.  60 tablet  2  . ondansetron (ZOFRAN) 8 MG tablet Take 8 mg by mouth every 8 (eight) hours as needed for nausea or vomiting.      Marland Kitchen  Prenatal Vit-Fe Fumarate-FA (PRENATAL MULTIVITAMIN) TABS tablet Take 1 tablet by mouth daily at 12 noon.  30 tablet  12    ROS: Pertinent findings in history of present illness.  Physical Exam  Blood pressure 114/67, pulse 82, temperature 98 F (36.7 C), temperature source Oral, resp. rate 18, height 5\' 1"  (1.549 m), weight 57.607 kg (127 lb), last menstrual period 03/21/2013, not currently breastfeeding. GENERAL: Well-developed, well-nourished female in no acute distress.  HEENT: normocephalic HEART: normal rate RESP: normal effort ABDOMEN: Soft, non-tender, gravid appropriate for gestational age EXTREMITIES: Nontender, no edema NEURO: alert and oriented Pelvic exam: Cervix pink, visually closed, without lesion, moderate white frothy discharge, vaginal walls and external genitalia normal  Dilation: 3 Effacement (%): Thick Cervical Position: Posterior Station: Ballotable Exam by:: L. Leftwich-Kirby, CNM  FHT:  Baseline 135 , moderate variability, accelerations present, no  decelerations Contractions: irritability vs contractions every 2 minutes lasting 30-40 seconds, mild to moderate to palpation   Labs: No results found for this or any previous visit (from the past 24 hour(s)).   ED Course   Assessment: 1. Short cervix in second trimester, antepartum   2. Abnormal fetal ultrasound   3. Pregnancy with history of pre-term labor, second trimester     Plan: Admission for tocolysis and repeat betamethasone injection     Medication List    ASK your doctor about these medications       acetaminophen 500 MG tablet  Commonly known as:  TYLENOL  Take 500 mg by mouth every 4 (four) hours as needed for moderate pain.     docusate sodium 100 MG capsule  Commonly known as:  COLACE  Take 100 mg by mouth 2 (two) times daily as needed for mild constipation.     NIFEdipine 30 MG 24 hr tablet  Commonly known as:  PROCARDIA XL  Take 1 tablet (30 mg total) by mouth 2 (two) times daily.     ondansetron 8 MG tablet  Commonly known as:  ZOFRAN  Take 8 mg by mouth every 8 (eight) hours as needed for nausea or vomiting.     prenatal multivitamin Tabs tablet  Take 1 tablet by mouth daily at 12 noon.        Fatima Blank Certified Nurse-Midwife 10/09/2013 11:53 AM

## 2013-10-10 ENCOUNTER — Encounter: Payer: Self-pay | Admitting: Family

## 2013-10-10 MED ORDER — METRONIDAZOLE 500 MG PO TABS
500.0000 mg | ORAL_TABLET | Freq: Two times a day (BID) | ORAL | Status: DC
  Administered 2013-10-10 – 2013-10-12 (×5): 500 mg via ORAL
  Filled 2013-10-10 (×5): qty 1

## 2013-10-10 NOTE — Progress Notes (Signed)
Patient ID: Cindy Armstrong, female   DOB: 1990/08/28, 23 y.o.   MRN: [id number] [place]) NOTE  Cindy Armstrong is a 23 y.o. G2P0101 at [redacted]w[redacted]d by LMP who is admitted for Preterm labor.   Fetal presentation is cephalic. Length of Stay:  1  Days  Subjective:rare mild contractions  Patient reports the fetal movement as active. Patient reports uterine contraction  activity as none. Patient reports  vaginal bleeding as none. Patient describes fluid per vagina as None.slight discharge no itch  Vitals:  Blood pressure 117/53, pulse 75, temperature 98.1 F (36.7 C), temperature source Oral, resp. rate 16, height 5\' 1"  (1.549 m), weight 127 lb (57.607 kg), last menstrual period 03/21/2013, not currently breastfeeding. Physical Examination:  General appearance - alert, well appearing, and in no distress Heart - normal rate and regular rhythm Abdomen - soft, nontender, nondistended Fundal Height:  size equals dates Cervical Exam: Not evaluated.  Extremities: extremities normal, atraumatic, no cyanosis or edema and Homans sign is negative, no sign of DVT SCD on Membranes:intact  Fetal Monitoring:  Baseline: 125 bpm, Variability: Good {> 6 bpm), Accelerations: Non-reactive but appropriate for gestational age and Decelerations: Absent  Labs:  Results for orders placed during the hospital encounter of 10/09/13 (from the past 24 hour(s))  URINALYSIS, ROUTINE W REFLEX MICROSCOPIC   Collection Time    10/09/13 11:11 AM      Result Value Ref Range   Color, Urine YELLOW  YELLOW   APPearance CLEAR  CLEAR   Specific Gravity, Urine 1.020  1.005 - 1.030   pH 7.0  5.0 - 8.0   Glucose, UA NEGATIVE  NEGATIVE mg/dL   Hgb urine dipstick NEGATIVE  NEGATIVE   Bilirubin Urine NEGATIVE  NEGATIVE   Ketones, ur 15 (*) NEGATIVE mg/dL   Protein, ur NEGATIVE  NEGATIVE mg/dL   Urobilinogen, UA 0.2  0.0 - 1.0 mg/dL   Nitrite NEGATIVE  NEGATIVE   Leukocytes, UA SMALL (*)  NEGATIVE  URINE MICROSCOPIC-ADD ON   Collection Time    10/09/13 11:11 AM      Result Value Ref Range   Squamous Epithelial / LPF MANY (*) RARE   WBC, UA 7-10  <3 WBC/hpf   Urine-Other MUCOUS PRESENT    WET PREP, GENITAL   Collection Time    10/09/13 11:53 AM      Result Value Ref Range   Yeast Wet Prep HPF POC NONE SEEN  NONE SEEN   Trich, Wet Prep NONE SEEN  NONE SEEN   Clue Cells Wet Prep HPF POC MANY (*) NONE SEEN   WBC, Wet Prep HPF POC TOO NUMEROUS TO COUNT (*) NONE SEEN  FETAL FIBRONECTIN   Collection Time    10/09/13 11:53 AM      Result Value Ref Range   Fetal Fibronectin POSITIVE (*) NEGATIVE  CBC   Collection Time    10/09/13 12:15 PM      Result Value Ref Range   WBC 8.3  4.0 - 10.5 K/uL   RBC 3.56 (*) 3.87 - 5.11 MIL/uL   Hemoglobin 11.0 (*) 12.0 - 15.0 g/dL   HCT 31.7 (*) 36.0 - 46.0 %   MCV 89.0  78.0 - 100.0 fL   MCH 30.9  26.0 - 34.0 pg   MCHC 34.7  30.0 - 36.0 g/dL   RDW 13.6  11.5 - 15.5 %   Platelets 179  150 - 400 K/uL    Imaging Studies:     Currently EPIC  will not allow sonographic studies to automatically populate into notes.  In the meantime, copy and paste results into note or free text.  Medications:  Scheduled . betamethasone acetate-betamethasone sodium phosphate  12 mg Intramuscular Once  . docusate sodium  100 mg Oral Daily  . metroNIDAZOLE  500 mg Oral Q12H  . pencillin G potassium IV  2.5 Million Units Intravenous Q4H  . prenatal multivitamin  1 tablet Oral Q1200   I have reviewed the patient's current medications.  ASSESSMENT: Patient Active Problem List   Diagnosis Date Noted  . Preterm labor 10/09/2013  . GBS (group B Streptococcus carrier), +RV culture, currently pregnant 09/08/2013  . Short cervix in second trimester, antepartum 09/07/2013  . Threatened preterm labor 09/06/2013  . Abnormal fetal ultrasound 08/12/2013  . Pregnancy with history of pre-term labor 07/20/2013  . Pyelonephritis complicating pregnancy in second  trimester, antepartum 07/15/2013    PLAN: Magnesium for 24 hr, second betamethasone  ARNOLD,JAMES 10/10/2013,7:18 AM

## 2013-10-10 NOTE — Progress Notes (Signed)
Ur chart review completed.  

## 2013-10-11 DIAGNOSIS — O4700 False labor before 37 completed weeks of gestation, unspecified trimester: Principal | ICD-10-CM

## 2013-10-11 DIAGNOSIS — O26879 Cervical shortening, unspecified trimester: Secondary | ICD-10-CM

## 2013-10-11 DIAGNOSIS — O09219 Supervision of pregnancy with history of pre-term labor, unspecified trimester: Secondary | ICD-10-CM

## 2013-10-11 MED ORDER — NIFEDIPINE ER OSMOTIC RELEASE 30 MG PO TB24
30.0000 mg | ORAL_TABLET | Freq: Two times a day (BID) | ORAL | Status: DC
  Administered 2013-10-11 – 2013-10-12 (×3): 30 mg via ORAL
  Filled 2013-10-11 (×3): qty 1

## 2013-10-11 NOTE — Progress Notes (Signed)
Patient ID: Cindy Armstrong, female   DOB: Mar 28, 1990, 23 y.o.   MRN: [id number] [place]) NOTE  Cindy Armstrong is a 23 y.o. G2P0101 at [redacted]w[redacted]d by LMP who is admitted for Preterm labor.   Fetal presentation is cephalic. Length of Stay:  2  Days  Subjective:Increased contractions since medication was discontinued  Patient reports the fetal movement as active. Patient reports uterine contraction  activity as none. Patient reports  vaginal bleeding as none. Patient describes fluid per vagina as None.slight discharge no itch  Vitals:  Blood pressure 112/63, pulse 79, temperature 98.4 F (36.9 C), temperature source Oral, resp. rate 18, height 5\' 1"  (1.549 m), weight 127 lb (57.607 kg), last menstrual period 03/21/2013, not currently breastfeeding. Physical Examination:  General appearance - alert, well appearing, and in no distress Heart - normal rate and regular rhythm Abdomen - soft, nontender, gravid Fundal Height:  size equals dates Cervical Exam: Not evaluated.  Extremities: extremities normal, atraumatic, no cyanosis or edema and Homans sign is negative, no sign of DVT SCD on Membranes:intact  Fetal Monitoring:  Baseline: 130 bpm, Variability: Good {> 6 bpm), Accelerations: Reactive and Decelerations: Absent Toco: irregular contractions  Labs:  No results found for this or any previous visit (from the past 24 hour(s)).  Imaging Studies:     Currently EPIC will not allow sonographic studies to automatically populate into notes.  In the meantime, copy and paste results into note or free text.  Medications:  Scheduled . docusate sodium  100 mg Oral Daily  . metroNIDAZOLE  500 mg Oral Q12H  . NIFEdipine  30 mg Oral BID  . prenatal multivitamin  1 tablet Oral Q1200   I have reviewed the patient's current medications.  ASSESSMENT: Patient Active Problem List   Diagnosis Date Noted  . Preterm labor 10/09/2013  . GBS (group B Streptococcus  carrier), +RV culture, currently pregnant 09/08/2013  . Short cervix in second trimester, antepartum 09/07/2013  . Threatened preterm labor 09/06/2013  . Abnormal fetal ultrasound 08/12/2013  . Pregnancy with history of pre-term labor 07/20/2013  . Pyelonephritis complicating pregnancy in second trimester, antepartum 07/15/2013    PLAN: Continue tocolysis with procardia Continue close monitoring for preterm labor Continue current antepartum care D/C planning if unchanged cervical exam  Mariesha Venturella 10/11/2013,6:56 AM

## 2013-10-12 MED ORDER — METRONIDAZOLE 500 MG PO TABS: 500.0000 mg | ORAL_TABLET | Freq: Two times a day (BID) | ORAL | Status: DC

## 2013-10-12 MED ORDER — NIFEDIPINE ER 30 MG PO TB24: 30.0000 mg | ORAL_TABLET | Freq: Two times a day (BID) | ORAL | Status: DC

## 2013-10-12 NOTE — Progress Notes (Signed)
Pt. Is stable and ready to be discharged home. All discharge instructions and prescriptions reviewed with pt. Pt. Verbalized understanding of prescriptions and when she needs to follow up with office and when she needs to come back to the hospital. All belongings with the patient. Pt. Wheeled out via wheelchair to partner's car.

## 2013-10-12 NOTE — Discharge Summary (Signed)
Antenatal Physician Discharge Summary  Patient ID: Cindy Armstrong MRN: [id number] DOB/AGE: 22-19-1992 22 y.o.  Admit date: 10/09/2013 Discharge date: 10/12/2013  Admission Diagnoses: preterm labor  Discharge Diagnoses:  Preterm labor  Prenatal Procedures: NST  Intrapartum Procedures: Neonatology, Maternal Fetal Medicine   Significant Diagnostic Studies:  Results for orders placed during the hospital encounter of 10/09/13 (from the past 168 hour(s))  URINALYSIS, ROUTINE W REFLEX MICROSCOPIC   Collection Time    10/09/13 11:11 AM      Result Value Ref Range   Color, Urine YELLOW  YELLOW   APPearance CLEAR  CLEAR   Specific Gravity, Urine 1.020  1.005 - 1.030   pH 7.0  5.0 - 8.0   Glucose, UA NEGATIVE  NEGATIVE mg/dL   Hgb urine dipstick NEGATIVE  NEGATIVE   Bilirubin Urine NEGATIVE  NEGATIVE   Ketones, ur 15 (*) NEGATIVE mg/dL   Protein, ur NEGATIVE  NEGATIVE mg/dL   Urobilinogen, UA 0.2  0.0 - 1.0 mg/dL   Nitrite NEGATIVE  NEGATIVE   Leukocytes, UA SMALL (*) NEGATIVE  URINE MICROSCOPIC-ADD ON   Collection Time    10/09/13 11:11 AM      Result Value Ref Range   Squamous Epithelial / LPF MANY (*) RARE   WBC, UA 7-10  <3 WBC/hpf   Urine-Other MUCOUS PRESENT    WET PREP, GENITAL   Collection Time    10/09/13 11:53 AM      Result Value Ref Range   Yeast Wet Prep HPF POC NONE SEEN  NONE SEEN   Trich, Wet Prep NONE SEEN  NONE SEEN   Clue Cells Wet Prep HPF POC MANY (*) NONE SEEN   WBC, Wet Prep HPF POC TOO NUMEROUS TO COUNT (*) NONE SEEN  FETAL FIBRONECTIN   Collection Time    10/09/13 11:53 AM      Result Value Ref Range   Fetal Fibronectin POSITIVE (*) NEGATIVE  CBC   Collection Time    10/09/13 12:15 PM      Result Value Ref Range   WBC 8.3  4.0 - 10.5 K/uL   RBC 3.56 (*) 3.87 - 5.11 MIL/uL   Hemoglobin 11.0 (*) 12.0 - 15.0 g/dL   HCT 31.7 (*) 36.0 - 46.0 %   MCV 89.0  78.0 - 100.0 fL   MCH 30.9  26.0 - 34.0 pg   MCHC 34.7  30.0 - 36.0 g/dL   RDW 13.6   11.5 - 15.5 %   Platelets 179  150 - 400 K/uL  Results for orders placed in visit on 10/06/13 (from the past 168 hour(s))  POCT URINALYSIS DIP (DEVICE)   Collection Time    10/06/13  8:47 AM      Result Value Ref Range   Glucose, UA NEGATIVE  NEGATIVE mg/dL   Bilirubin Urine NEGATIVE  NEGATIVE   Ketones, ur NEGATIVE  NEGATIVE mg/dL   Specific Gravity, Urine 1.020  1.005 - 1.030   Hgb urine dipstick NEGATIVE  NEGATIVE   pH 7.0  5.0 - 8.0   Protein, ur NEGATIVE  NEGATIVE mg/dL   Urobilinogen, UA 0.2  0.0 - 1.0 mg/dL   Nitrite NEGATIVE  NEGATIVE   Leukocytes, UA MODERATE (*) NEGATIVE  CBC   Collection Time    10/06/13  2:30 PM      Result Value Ref Range   WBC 7.9  4.0 - 10.5 K/uL   RBC 3.57 (*) 3.87 - 5.11 MIL/uL   Hemoglobin 10.9 (*) 12.0 -  15.0 g/dL   HCT 31.9 (*) 36.0 - 46.0 %   MCV 89.4  78.0 - 100.0 fL   MCH 30.5  26.0 - 34.0 pg   MCHC 34.2  30.0 - 36.0 g/dL   RDW 14.2  11.5 - 15.5 %   Platelets 190  150 - 400 K/uL  RPR   Collection Time    10/06/13  2:30 PM      Result Value Ref Range   RPR NON REAC  NON REAC  HIV ANTIBODY (ROUTINE TESTING)   Collection Time    10/06/13  2:30 PM      Result Value Ref Range   HIV 1&2 Ab, 4th Generation NONREACTIVE  NONREACTIVE  GLUCOSE TOLERANCE, 3 HOURS   Collection Time    10/06/13  2:30 PM      Result Value Ref Range   Glucose, Fasting-Gestational 73  70 - 104 mg/dL   Glucose, 1 Hour-Gestational 138  70 - 189 mg/dL   Glucose, 2 Hour-Gestational 64 (*) 70 - 164 mg/dL   Glucose, GTT - 3 Hour 70  70 - 144 mg/dL    Treatments: IV hydration and Procardia for tocolysis  Hospital Course:  This is a 23 y.o. G2P0101 with IUP at [redacted]w[redacted]d admitted for preterm labor with h/o preterm labor in a previous pregnancy after PROM.  She has been on 17-OH-P.   She was admitted with contractions, noted to have a cervical exam of 4cm/50% effaced.  No leaking of fluid and no bleeding.  She was initially started on magnesium sulfate for tocolysis and  neuroprotection and also received betamethasone x 2 doses.  Her tocolysis was transitioned to Procardia. She was seen by Neonatology during her stay.  She was observed, fetal heart rate monitoring remained reassuring, and she had no signs/symptoms of progressing preterm labor or other maternal-fetal concerns.  She reports occ ctx overnight but is other wise asymptomatic. Her cervical exam was improved from admission.  She reports that she lives [place] from [place] and has help at home with her 66 year old son.  She was deemed stable for discharge to home with outpatient follow up.  Discharge Exam: BP 112/42  Pulse 65  Temp(Src) 98.8 F (37.1 C) (Oral)  Resp 18  Ht 5\' 1"  (1.549 m)  Wt 127 lb (57.607 kg)  BMI 24.01 kg/m2  LMP 03/21/2013 General appearance: alert and no distress GI: soft, non-tender; bowel sounds normal; no masses,  no organomegaly and gravid Pelvic: 2cm/ 50%/post/vertex   Ext: no edema or tenderness Toco: no ctx FHR Cat1 Discharge Condition: good  Disposition: 01-Home or Self Care  Discharge Instructions   Discharge activity:  Bathroom / Shower only    Complete by:  As directed      Discharge activity:  Up to eat    Complete by:  As directed      Discharge activity:    Complete by:  As directed   No lifting or stooping or exercise     Discharge diet:  No restrictions    Complete by:  As directed      Do not have sex or do anything that might make you have an orgasm    Complete by:  As directed      Notify physician for a general feeling that "something is not right"    Complete by:  As directed      Notify physician for increase or change in vaginal discharge    Complete by:  As  directed      Notify physician for intestinal cramps, with or without diarrhea, sometimes described as "gas pain"    Complete by:  As directed      Notify physician for leaking of fluid    Complete by:  As directed      Notify physician for low, dull backache, unrelieved by heat or Tylenol     Complete by:  As directed      Notify physician for menstrual like cramps    Complete by:  As directed      Notify physician for pelvic pressure    Complete by:  As directed      Notify physician for uterine contractions.  These may be painless and feel like the uterus is tightening or the baby is  "balling up"    Complete by:  As directed      Notify physician for vaginal bleeding    Complete by:  As directed      PRETERM LABOR:  Includes any of the follwing symptoms that occur between 20 - [redacted] weeks gestation.  If these symptoms are not stopped, preterm labor can result in preterm delivery, placing your baby at risk    Complete by:  As directed             Medication List         acetaminophen 500 MG tablet  Commonly known as:  TYLENOL  Take 500 mg by mouth every 4 (four) hours as needed for moderate pain.     docusate sodium 100 MG capsule  Commonly known as:  COLACE  Take 100 mg by mouth 2 (two) times daily as needed for mild constipation.     metroNIDAZOLE 500 MG tablet  Commonly known as:  FLAGYL  Take 1 tablet (500 mg total) by mouth every 12 (twelve) hours.     NIFEdipine 30 MG 24 hr tablet  Commonly known as:  PROCARDIA-XL/ADALAT CC  Take 1 tablet (30 mg total) by mouth 2 (two) times daily.     ondansetron 8 MG tablet  Commonly known as:  ZOFRAN  Take 8 mg by mouth every 8 (eight) hours as needed for nausea or vomiting.     prenatal multivitamin Tabs tablet  Take 1 tablet by mouth daily at 12 noon.           Follow-up Information   Follow up with [facility] In 1 day. (for 17-OH-P)       Signed: Lavonia Drafts M.D. 10/12/2013, 7:59 AM

## 2013-10-12 NOTE — Discharge Instructions (Signed)

## 2013-10-13 ENCOUNTER — Ambulatory Visit (INDEPENDENT_AMBULATORY_CARE_PROVIDER_SITE_OTHER): Payer: Medicaid Other | Admitting: General Practice

## 2013-10-13 DIAGNOSIS — O09219 Supervision of pregnancy with history of pre-term labor, unspecified trimester: Secondary | ICD-10-CM

## 2013-10-16 ENCOUNTER — Encounter (HOSPITAL_COMMUNITY): Payer: Self-pay

## 2013-10-16 ENCOUNTER — Inpatient Hospital Stay (HOSPITAL_COMMUNITY)
Admission: AD | Admit: 2013-10-16 | Discharge: 2013-10-16 | Disposition: A | Discharge status: Home / Self Care | Payer: Medicaid Other | Source: Ambulatory Visit | Attending: Family Medicine | Admitting: Family Medicine

## 2013-10-16 DIAGNOSIS — O4700 False labor before 37 completed weeks of gestation, unspecified trimester: Secondary | ICD-10-CM | POA: Diagnosis present

## 2013-10-16 DIAGNOSIS — Z87891 Personal history of nicotine dependence: Secondary | ICD-10-CM | POA: Diagnosis not present

## 2013-10-16 DIAGNOSIS — O4703 False labor before 37 completed weeks of gestation, third trimester: Secondary | ICD-10-CM

## 2013-10-16 HISTORY — DX: Anemia, unspecified: D64.9

## 2013-10-16 MED ORDER — NIFEDIPINE 10 MG PO CAPS
10.0000 mg | ORAL_CAPSULE | Freq: Three times a day (TID) | ORAL | Status: DC
  Administered 2013-10-16: 10 mg via ORAL
  Filled 2013-10-16: qty 1

## 2013-10-16 NOTE — MAU Note (Signed)
States pain is 6/10, instead of 8/10, feels better.

## 2013-10-16 NOTE — MAU Provider Note (Signed)
History     CSN: [id number]  Arrival date and time: 10/16/13 0607   First Provider Initiated Contact with Patient 10/16/13 [phone]      Chief Complaint  Patient presents with  . Contractions   HPI  Pt is a 23 yo G2P0101 at 103w6d wks IUP brought in via EMS due to preterm contractions that began at 0200.  Contractions reported every 3-4 minutes at home. Pt denies vaginal bleeding or leaking of fluid.  Pt has h/o preterm labor in a previous pregnancy after PROM. She has been on 17-OH-P. She was admitted with contractions, noted to have a cervical exam of 4cm/50% effaced. No leaking of fluid and no bleeding. She was initially started on magnesium sulfate for tocolysis and neuroprotection and also received betamethasone x 2 doses. Her tocolysis was transitioned to Procardia. She was seen by Neonatology during her stay. She was observed, fetal heart rate monitoring remained reassuring, and she had no signs/symptoms of progressing preterm labor or other maternal-fetal concerns. Her cervical exam at discharge was improved and noted to be 2 cm.  Pt taking PO procardia at home.    Past Medical History  Diagnosis Date  . No pertinent past medical history   . Preterm labor   . Anemia     Past Surgical History  Procedure Laterality Date  . No past surgeries      Family History  Problem Relation Age of Onset  . Alcohol abuse Neg Hx   . Arthritis Neg Hx   . Asthma Neg Hx   . Birth defects Neg Hx   . Cancer Neg Hx   . COPD Neg Hx   . Depression Neg Hx   . Early death Neg Hx   . Hearing loss Neg Hx   . Heart disease Neg Hx   . Hyperlipidemia Neg Hx   . Hypertension Neg Hx   . Kidney disease Neg Hx   . Mental illness Neg Hx   . Mental retardation Neg Hx   . Miscarriages / Stillbirths Neg Hx   . Stroke Neg Hx   . Vision loss Neg Hx   . Other Neg Hx   . Diabetes Mother   . Drug abuse Mother   . Learning disabilities Brother     History  Substance Use Topics  . Smoking status: Former  Smoker -- 0.25 packs/day for 3 years    Types: Cigarettes  . Smokeless tobacco: Never Used  . Alcohol Use: No    Allergies: No Known Allergies  Facility-administered medications prior to admission  Medication Dose Route Frequency Provider Last Rate Last Dose  . hydroxyprogesterone caproate (DELALUTIN) 250 mg/mL injection 250 mg  250 mg Intramuscular Weekly Peggy Constant, MD   250 mg at 10/13/13 1440   Prescriptions prior to admission  Medication Sig Dispense Refill  . acetaminophen (TYLENOL) 500 MG tablet Take 500 mg by mouth every 4 (four) hours as needed for moderate pain.      Marland Kitchen docusate sodium (COLACE) 100 MG capsule Take 100 mg by mouth 2 (two) times daily as needed for mild constipation.      . metroNIDAZOLE (FLAGYL) 500 MG tablet Take 1 tablet (500 mg total) by mouth every 12 (twelve) hours.  10 tablet  0  . NIFEdipine (PROCARDIA-XL/ADALAT CC) 30 MG 24 hr tablet Take 1 tablet (30 mg total) by mouth 2 (two) times daily.  60 tablet  2  . ondansetron (ZOFRAN) 8 MG tablet Take 8 mg by mouth every 8 (  eight) hours as needed for nausea or vomiting.      . Prenatal Vit-Fe Fumarate-FA (PRENATAL MULTIVITAMIN) TABS tablet Take 1 tablet by mouth daily at 12 noon.  30 tablet  12    ROS Physical Exam   Blood pressure 115/61, pulse 81, temperature 98.3 F (36.8 C), temperature source Oral, resp. rate 18, last menstrual period 03/21/2013, not currently breastfeeding.  Physical Exam  Constitutional: She is oriented to person, place, and time. She appears well-developed and well-nourished. No distress.  HENT:  Head: Normocephalic.  Neck: Normal range of motion. Neck supple.  Cardiovascular: Normal rate, regular rhythm and normal heart sounds.   Respiratory: Effort normal and breath sounds normal.  GI: Soft. There is no tenderness.  Genitourinary: No bleeding around the vagina. Vaginal discharge (mucusy) found.  Musculoskeletal: Normal range of motion. She exhibits no edema.  Neurological:  She is alert and oriented to person, place, and time.  Skin: Skin is warm and dry.     Dilation: 2 Effacement (%): 50  FHR 140's, +accels; Toco - every 5 min  0700 10 mg procardia PO ordered  0800 Pt reports contractions have stopped; cervical recheck 2 cm (no change)  MAU Course  Procedures   Assessment and Plan  23 yo G2P0101 at [redacted]w[redacted]d wks IUP Preterm Contractions - Stable Exam; previously treated with BMZ and magnesium sulfate Category I FHR Tracing  Plan: Discharge to home Preterm labor precautions Keep scheduled appoinment  Kathrine Haddock N 10/16/2013, 7:03 AM

## 2013-10-16 NOTE — Discharge Instructions (Signed)

## 2013-10-16 NOTE — MAU Provider Note (Signed)
Attestation of Attending Supervision of Advanced Practitioner (PA/CNM/NP): Evaluation and management procedures were performed by the Advanced Practitioner under my supervision and collaboration.  I have reviewed the Advanced Practitioner's note and chart, and I agree with the management and plan.  Donnamae Jude, MD [facility] Attending 10/16/2013 4:28 PM

## 2013-10-16 NOTE — MAU Note (Addendum)
Been having contractions and is taking Procardia. Contractions started to worsen hour and half ago.  No leaking, no bleeding.  Baby moving but not as much as usual.  Was released from hospital on Wednesday.

## 2013-10-20 ENCOUNTER — Ambulatory Visit (INDEPENDENT_AMBULATORY_CARE_PROVIDER_SITE_OTHER): Payer: Medicaid Other | Admitting: Obstetrics & Gynecology

## 2013-10-20 VITALS — BP 128/69 | HR 78 | Temp 98.2°F | Wt 127.1 lb

## 2013-10-20 DIAGNOSIS — Z23 Encounter for immunization: Secondary | ICD-10-CM

## 2013-10-20 DIAGNOSIS — N12 Tubulo-interstitial nephritis, not specified as acute or chronic: Secondary | ICD-10-CM

## 2013-10-20 DIAGNOSIS — O2390 Unspecified genitourinary tract infection in pregnancy, unspecified trimester: Secondary | ICD-10-CM

## 2013-10-20 DIAGNOSIS — O09219 Supervision of pregnancy with history of pre-term labor, unspecified trimester: Secondary | ICD-10-CM

## 2013-10-20 DIAGNOSIS — O09213 Supervision of pregnancy with history of pre-term labor, third trimester: Secondary | ICD-10-CM

## 2013-10-20 DIAGNOSIS — O2302 Infections of kidney in pregnancy, second trimester: Secondary | ICD-10-CM

## 2013-10-20 LAB — POCT URINALYSIS DIP (DEVICE)
Bilirubin Urine: NEGATIVE
Glucose, UA: NEGATIVE mg/dL
Hgb urine dipstick: NEGATIVE
Ketones, ur: NEGATIVE mg/dL
NITRITE: NEGATIVE
Protein, ur: NEGATIVE mg/dL
SPECIFIC GRAVITY, URINE: 1.015 (ref 1.005–1.030)
Urobilinogen, UA: 0.2 mg/dL (ref 0.0–1.0)
pH: 6 (ref 5.0–8.0)

## 2013-10-20 MED ORDER — TETANUS-DIPHTH-ACELL PERTUSSIS 5-2.5-18.5 LF-MCG/0.5 IM SUSP
0.5000 mL | Freq: Once | INTRAMUSCULAR | Status: AC
  Administered 2013-10-20: 0.5 mL via INTRAMUSCULAR

## 2013-10-20 NOTE — Progress Notes (Signed)
Tdap and Flu vaccines today. Continue weekly 17P.  Desires BTL, counseled about risk of of regret, permanence of method. Will sign papers today, but will give more information about LARCs. Preterm labor and fetal movement precautions reviewed, continue Procardia for now.

## 2013-10-20 NOTE — Patient Instructions (Addendum)
Return to clinic for any obstetric concerns or go to [facility]U for evaluation Contraception Choices Contraception (birth control) is the use of any methods or devices to prevent pregnancy. Below are some methods to help avoid pregnancy. HORMONAL METHODS   Contraceptive implant. This is a thin, plastic tube containing progesterone hormone. It does not contain estrogen hormone. Your health care provider inserts the tube in the inner part of the upper arm. The tube can remain in place for up to 3 years. After 3 years, the implant must be removed. The implant prevents the ovaries from releasing an egg (ovulation), thickens the cervical mucus to prevent sperm from entering the uterus, and thins the lining of the inside of the uterus.  Progesterone-only injections. These injections are given every 3 months by your health care provider to prevent pregnancy. This synthetic progesterone hormone stops the ovaries from releasing eggs. It also thickens cervical mucus and changes the uterine lining. This makes it harder for sperm to survive in the uterus.  Birth control pills. These pills contain estrogen and progesterone hormone. They work by preventing the ovaries from releasing eggs (ovulation). They also cause the cervical mucus to thicken, preventing the sperm from entering the uterus. Birth control pills are prescribed by a health care provider.Birth control pills can also be used to treat heavy periods.  Minipill. This type of birth control pill contains only the progesterone hormone. They are taken every day of each month and must be prescribed by your health care provider.  Birth control patch. The patch contains hormones similar to those in birth control pills. It must be changed once a week and is prescribed by a health care provider.  Vaginal ring. The ring contains hormones similar to those in birth control pills. It is left in the vagina for 3 weeks, removed for 1 week, and then a new one is put back in  place. The patient must be comfortable inserting and removing the ring from the vagina.A health care provider's prescription is necessary.  Emergency contraception. Emergency contraceptives prevent pregnancy after unprotected sexual intercourse. This pill can be taken right after sex or up to 5 days after unprotected sex. It is most effective the sooner you take the pills after having sexual intercourse. Most emergency contraceptive pills are available without a prescription. Check with your pharmacist. Do not use emergency contraception as your only form of birth control. BARRIER METHODS   Female condom. This is a thin sheath (latex or rubber) that is worn over the penis during sexual intercourse. It can be used with spermicide to increase effectiveness.  Female condom. This is a soft, loose-fitting sheath that is put into the vagina before sexual intercourse.  Diaphragm. This is a soft, latex, dome-shaped barrier that must be fitted by a health care provider. It is inserted into the vagina, along with a spermicidal jelly. It is inserted before intercourse. The diaphragm should be left in the vagina for 6 to 8 hours after intercourse.  Cervical cap. This is a round, soft, latex or plastic cup that fits over the cervix and must be fitted by a health care provider. The cap can be left in place for up to 48 hours after intercourse.  Sponge. This is a soft, circular piece of polyurethane foam. The sponge has spermicide in it. It is inserted into the vagina after wetting it and before sexual intercourse.  Spermicides. These are chemicals that kill or block sperm from entering the cervix and uterus. They come in the  form of creams, jellies, suppositories, foam, or tablets. They do not require a prescription. They are inserted into the vagina with an applicator before having sexual intercourse. The process must be repeated every time you have sexual intercourse. INTRAUTERINE CONTRACEPTION  Intrauterine  device (IUD). This is a T-shaped device that is put in a woman's uterus during a menstrual period to prevent pregnancy. There are 2 types:  Copper IUD. This type of IUD is wrapped in copper wire and is placed inside the uterus. Copper makes the uterus and fallopian tubes produce a fluid that kills sperm. It can stay in place for 10 years.  Hormone IUD. This type of IUD contains the hormone progestin (synthetic progesterone). The hormone thickens the cervical mucus and prevents sperm from entering the uterus, and it also thins the uterine lining to prevent implantation of a fertilized egg. The hormone can weaken or kill the sperm that get into the uterus. It can stay in place for 3-5 years, depending on which type of IUD is used. PERMANENT METHODS OF CONTRACEPTION  Female tubal ligation. This is when the woman's fallopian tubes are surgically sealed, tied, or blocked to prevent the egg from traveling to the uterus.  Hysteroscopic sterilization. This involves placing a small coil or insert into each fallopian tube. Your doctor uses a technique called hysteroscopy to do the procedure. The device causes scar tissue to form. This results in permanent blockage of the fallopian tubes, so the sperm cannot fertilize the egg. It takes about 3 months after the procedure for the tubes to become blocked. You must use another form of birth control for these 3 months.  Female sterilization. This is when the female has the tubes that carry sperm tied off (vasectomy).This blocks sperm from entering the vagina during sexual intercourse. After the procedure, the man can still ejaculate fluid (semen). NATURAL PLANNING METHODS  Natural family planning. This is not having sexual intercourse or using a barrier method (condom, diaphragm, cervical cap) on days the woman could become pregnant.  Calendar method. This is keeping track of the length of each menstrual cycle and identifying when you are fertile.  Ovulation method.  This is avoiding sexual intercourse during ovulation.  Symptothermal method. This is avoiding sexual intercourse during ovulation, using a thermometer and ovulation symptoms.  Post-ovulation method. This is timing sexual intercourse after you have ovulated. Regardless of which type or method of contraception you choose, it is important that you use condoms to protect against the transmission of sexually transmitted infections (STIs). Talk with your health care provider about which form of contraception is most appropriate for you. Document Released: 01/27/2005 Document Revised: 02/01/2013 Document Reviewed: 07/22/2012 [facility] Patient Information 2015 [place], [place]. This information is not intended to replace advice given to you by your health care provider. Make sure you discuss any questions you have with your health care provider.

## 2013-10-20 NOTE — Progress Notes (Signed)
Reports intermittent pelvic pressure and lower back pain.  Reports having lots of contractions on Sunday-- came to the hospital and was given procardia-- currently taking twice a day and has not experienced any contractions since.

## 2013-10-24 ENCOUNTER — Encounter: Payer: Self-pay | Admitting: *Deleted

## 2013-10-27 ENCOUNTER — Ambulatory Visit (INDEPENDENT_AMBULATORY_CARE_PROVIDER_SITE_OTHER): Payer: Medicaid Other | Admitting: *Deleted

## 2013-10-27 VITALS — BP 121/68 | HR 93 | Temp 98.1°F

## 2013-10-27 DIAGNOSIS — O09213 Supervision of pregnancy with history of pre-term labor, third trimester: Secondary | ICD-10-CM

## 2013-10-27 DIAGNOSIS — O09219 Supervision of pregnancy with history of pre-term labor, unspecified trimester: Secondary | ICD-10-CM

## 2013-11-01 ENCOUNTER — Encounter (HOSPITAL_COMMUNITY): Payer: Self-pay | Admitting: Emergency Medicine

## 2013-11-01 ENCOUNTER — Emergency Department (HOSPITAL_COMMUNITY)
Admission: EM | Admit: 2013-11-01 | Discharge: 2013-11-01 | Disposition: A | Discharge status: Home / Self Care | Payer: Medicaid Other | Attending: Emergency Medicine | Admitting: Emergency Medicine

## 2013-11-01 ENCOUNTER — Encounter: Payer: Self-pay | Admitting: General Practice

## 2013-11-01 ENCOUNTER — Telehealth: Payer: Self-pay | Admitting: *Deleted

## 2013-11-01 ENCOUNTER — Telehealth: Payer: Self-pay | Admitting: General Practice

## 2013-11-01 ENCOUNTER — Encounter: Payer: Self-pay | Admitting: Obstetrics & Gynecology

## 2013-11-01 DIAGNOSIS — Z792 Long term (current) use of antibiotics: Secondary | ICD-10-CM | POA: Insufficient documentation

## 2013-11-01 DIAGNOSIS — Z79899 Other long term (current) drug therapy: Secondary | ICD-10-CM | POA: Diagnosis not present

## 2013-11-01 DIAGNOSIS — Z87891 Personal history of nicotine dependence: Secondary | ICD-10-CM | POA: Diagnosis not present

## 2013-11-01 DIAGNOSIS — K089 Disorder of teeth and supporting structures, unspecified: Secondary | ICD-10-CM | POA: Diagnosis not present

## 2013-11-01 DIAGNOSIS — O9989 Other specified diseases and conditions complicating pregnancy, childbirth and the puerperium: Secondary | ICD-10-CM | POA: Insufficient documentation

## 2013-11-01 DIAGNOSIS — K0889 Other specified disorders of teeth and supporting structures: Secondary | ICD-10-CM

## 2013-11-01 DIAGNOSIS — Z862 Personal history of diseases of the blood and blood-forming organs and certain disorders involving the immune mechanism: Secondary | ICD-10-CM | POA: Insufficient documentation

## 2013-11-01 MED ORDER — ACETAMINOPHEN ER 650 MG PO TBCR: 650.0000 mg | EXTENDED_RELEASE_TABLET | Freq: Three times a day (TID) | ORAL | Status: DC | PRN

## 2013-11-01 MED ORDER — AMOXICILLIN 500 MG PO CAPS: 500.0000 mg | ORAL_CAPSULE | Freq: Three times a day (TID) | ORAL | Status: DC

## 2013-11-01 NOTE — ED Provider Notes (Signed)
CSN: [id number]     Arrival date & time 11/01/13  0915 History   First MD Initiated Contact with Patient 11/01/13 [phone]     Chief Complaint  Patient presents with  . Dental Pain     (Consider location/radiation/quality/duration/timing/severity/associated sxs/prior Treatment) HPI Comments: The patient is a 23 year old otherwise healthy female who is [redacted] weeks pregnant who presents with dental pain that started gradually 2 weeks ago. The dental pain is severe, constant and progressively worsening. The pain is aching and located in right upper jaw. The pain does not radiate. Eating makes the pain worse. Nothing makes the pain better. The patient has tried tylenol for pain which worked initially but is no longer helping. No associated symptoms. Patient denies headache, neck pain/stiffness, fever, NVD, edema, sore throat, throat swelling, wheezing, SOB, chest pain, abdominal pain.      Past Medical History  Diagnosis Date  . No pertinent past medical history   . Preterm labor   . Anemia    Past Surgical History  Procedure Laterality Date  . No past surgeries     Family History  Problem Relation Age of Onset  . Alcohol abuse Neg Hx   . Arthritis Neg Hx   . Asthma Neg Hx   . Birth defects Neg Hx   . Cancer Neg Hx   . COPD Neg Hx   . Depression Neg Hx   . Early death Neg Hx   . Hearing loss Neg Hx   . Heart disease Neg Hx   . Hyperlipidemia Neg Hx   . Hypertension Neg Hx   . Kidney disease Neg Hx   . Mental illness Neg Hx   . Mental retardation Neg Hx   . Miscarriages / Stillbirths Neg Hx   . Stroke Neg Hx   . Vision loss Neg Hx   . Other Neg Hx   . Diabetes Mother   . Drug abuse Mother   . Learning disabilities Brother    History  Substance Use Topics  . Smoking status: Former Smoker -- 3 years  . Smokeless tobacco: Never Used  . Alcohol Use: No   OB History   Grav Para Term Preterm Abortions TAB SAB Ect Mult Living   2 1 0 1 0 0 0 0 0 1      Review of Systems   Constitutional: Negative for fever, chills and fatigue.  HENT: Positive for dental problem. Negative for trouble swallowing.   Eyes: Negative for visual disturbance.  Respiratory: Negative for shortness of breath.   Cardiovascular: Negative for chest pain and palpitations.  Gastrointestinal: Negative for nausea, vomiting, abdominal pain and diarrhea.  Genitourinary: Negative for dysuria and difficulty urinating.  Musculoskeletal: Negative for arthralgias and neck pain.  Skin: Negative for color change.  Neurological: Negative for dizziness and weakness.  Psychiatric/Behavioral: Negative for dysphoric mood.      Allergies  Review of patient's allergies indicates no known allergies.  Home Medications   Prior to Admission medications   Medication Sig Start Date End Date Taking? Authorizing Provider  acetaminophen (TYLENOL) 500 MG tablet Take 500 mg by mouth every 4 (four) hours as needed for moderate pain.    Historical Provider, MD  docusate sodium (COLACE) 100 MG capsule Take 100 mg by mouth 2 (two) times daily as needed for mild constipation.    Historical Provider, MD  metroNIDAZOLE (FLAGYL) 500 MG tablet Take 1 tablet (500 mg total) by mouth every 12 (twelve) hours. 10/12/13   Lavonia Drafts,  MD  NIFEdipine (PROCARDIA-XL/ADALAT CC) 30 MG 24 hr tablet Take 1 tablet (30 mg total) by mouth 2 (two) times daily. 10/12/13   Lavonia Drafts, MD  ondansetron (ZOFRAN) 8 MG tablet Take 8 mg by mouth every 8 (eight) hours as needed for nausea or vomiting.    Historical Provider, MD  Prenatal Vit-Fe Fumarate-FA (PRENATAL MULTIVITAMIN) TABS tablet Take 1 tablet by mouth daily at 12 noon. 07/19/13   Peggy Constant, MD   BP 122/67  Pulse 80  Temp(Src) 98.3 F (36.8 C) (Oral)  Resp 18  Ht 5\' 3"  (1.6 m)  Wt 128 lb (58.06 kg)  BMI 22.68 kg/m2  SpO2 100%  LMP 03/21/2013 Physical Exam  Nursing note and vitals reviewed. Constitutional: She is oriented to person, place, and time. She  appears well-developed and well-nourished. No distress.  HENT:  Head: Normocephalic and atraumatic.  Good dentition. No decayed teeth or caries noted. Mild tenderness to percussion of right upper molars. No abscess or signs of ludwigs angina.   Eyes: Conjunctivae are normal.  Neck: Normal range of motion.  Cardiovascular: Normal rate and regular rhythm.  Exam reveals no gallop and no friction rub.   No murmur heard. Pulmonary/Chest: Effort normal and breath sounds normal. She has no wheezes. She has no rales. She exhibits no tenderness.  Abdominal: Soft. There is no tenderness.  Musculoskeletal: Normal range of motion.  Lymphadenopathy:    She has no cervical adenopathy.  Neurological: She is alert and oriented to person, place, and time. Coordination normal.  Speech is goal-oriented. Moves limbs without ataxia.   Skin: Skin is warm and dry.  Psychiatric: She has a normal mood and affect. Her behavior is normal.    ED Course  Procedures (including critical care time) Labs Review Labs Reviewed - No data to display  Imaging Review No results found.   EKG Interpretation None      MDM   Final diagnoses:  Pain, dental    9:54 AM Patient will have amoxicillin, which is safe in 2nd and 3rd trimester, for possible infection. Patient instructed to continue taking tylenol for pain, as I will not prescribe narcotics in pregnancy. Vitals stable and patient afebrile. No abscess or signs of ludwigs angina noted.    Alvina Chou, PA-C 11/01/13 1009

## 2013-11-01 NOTE — Discharge Instructions (Signed)
Take amoxicillin as directed until gone. Take tylenol as needed for pain. Follow up with Dr. Junita Push for further evaluation and management of you pain. Refer to attached documents for more information.

## 2013-11-01 NOTE — ED Notes (Signed)
Patient states dental pain in R upper teeth x 2 wks.   Patient states she is [redacted] wks pregnant and only able to take tylenol for pain.   Pt states it is no longer working,

## 2013-11-01 NOTE — Telephone Encounter (Signed)
Patient called and left message stating she called earlier for a letter for her dentist and now the dentist wants to refer her to a surgeon to have some teeth pulled and she needs a letter for that now.

## 2013-11-01 NOTE — ED Provider Notes (Signed)
Medical screening examination/treatment/procedure(s) were performed by non-physician practitioner and as supervising physician I was immediately available for consultation/collaboration.   EKG Interpretation None       Orlie Dakin, MD 11/01/13 2919

## 2013-11-01 NOTE — Telephone Encounter (Signed)
Patient called and stated that she is having a toothache. I called patient back and advised that she call the dentist and get an appointment. Patient agreeable to this and will let us know if she needs dental letter.

## 2013-11-01 NOTE — Telephone Encounter (Signed)
Created letter for patient. Called patient and informed her of letter and that it would be at the front office for her to come by and pickup either tomorrow or Thursday when she comes for her appt. Patient verbalized understanding and had no other questions

## 2013-11-03 ENCOUNTER — Ambulatory Visit (INDEPENDENT_AMBULATORY_CARE_PROVIDER_SITE_OTHER): Payer: Medicaid Other | Admitting: Family Medicine

## 2013-11-03 VITALS — BP 117/64 | HR 79 | Wt 133.7 lb

## 2013-11-03 DIAGNOSIS — O09213 Supervision of pregnancy with history of pre-term labor, third trimester: Secondary | ICD-10-CM

## 2013-11-03 DIAGNOSIS — N12 Tubulo-interstitial nephritis, not specified as acute or chronic: Secondary | ICD-10-CM

## 2013-11-03 DIAGNOSIS — O09219 Supervision of pregnancy with history of pre-term labor, unspecified trimester: Secondary | ICD-10-CM

## 2013-11-03 DIAGNOSIS — O2390 Unspecified genitourinary tract infection in pregnancy, unspecified trimester: Secondary | ICD-10-CM

## 2013-11-03 DIAGNOSIS — O26879 Cervical shortening, unspecified trimester: Secondary | ICD-10-CM

## 2013-11-03 DIAGNOSIS — O26872 Cervical shortening, second trimester: Secondary | ICD-10-CM

## 2013-11-03 DIAGNOSIS — O2302 Infections of kidney in pregnancy, second trimester: Secondary | ICD-10-CM

## 2013-11-03 LAB — POCT URINALYSIS DIP (DEVICE)
BILIRUBIN URINE: NEGATIVE
Glucose, UA: NEGATIVE mg/dL
Ketones, ur: NEGATIVE mg/dL
NITRITE: NEGATIVE
Protein, ur: NEGATIVE mg/dL
Specific Gravity, Urine: 1.01 (ref 1.005–1.030)
Urobilinogen, UA: 0.2 mg/dL (ref 0.0–1.0)
pH: 6.5 (ref 5.0–8.0)

## 2013-11-03 NOTE — Progress Notes (Signed)
Having a couple of contractions intermittently.  Nothing continuous.  Continue 17-P and procardia.  Good fetal movement.  No bleeding, spotting, leaking fluid.

## 2013-11-03 NOTE — Patient Instructions (Signed)

## 2013-11-08 ENCOUNTER — Encounter (HOSPITAL_COMMUNITY): Payer: Self-pay | Admitting: *Deleted

## 2013-11-08 ENCOUNTER — Inpatient Hospital Stay (HOSPITAL_COMMUNITY)
Admission: AD | Admit: 2013-11-08 | Discharge: 2013-11-15 | DRG: 775 | Disposition: A | Discharge status: Home / Self Care | Payer: Medicaid Other | Source: Ambulatory Visit | Attending: Obstetrics & Gynecology | Admitting: Obstetrics & Gynecology

## 2013-11-08 DIAGNOSIS — Z3A33 33 weeks gestation of pregnancy: Secondary | ICD-10-CM | POA: Diagnosis present

## 2013-11-08 DIAGNOSIS — Z833 Family history of diabetes mellitus: Secondary | ICD-10-CM | POA: Diagnosis not present

## 2013-11-08 DIAGNOSIS — Z87891 Personal history of nicotine dependence: Secondary | ICD-10-CM | POA: Diagnosis not present

## 2013-11-08 DIAGNOSIS — O26873 Cervical shortening, third trimester: Secondary | ICD-10-CM | POA: Diagnosis present

## 2013-11-08 DIAGNOSIS — O99824 Streptococcus B carrier state complicating childbirth: Secondary | ICD-10-CM | POA: Diagnosis present

## 2013-11-08 DIAGNOSIS — O42913 Preterm premature rupture of membranes, unspecified as to length of time between rupture and onset of labor, third trimester: Secondary | ICD-10-CM | POA: Diagnosis present

## 2013-11-08 LAB — WET PREP, GENITAL
CLUE CELLS WET PREP: NONE SEEN
Trich, Wet Prep: NONE SEEN
Yeast Wet Prep HPF POC: NONE SEEN

## 2013-11-08 LAB — OB RESULTS CONSOLE GBS: GBS: NEGATIVE

## 2013-11-08 MED ORDER — AMOXICILLIN 500 MG PO CAPS
500.0000 mg | ORAL_CAPSULE | Freq: Three times a day (TID) | ORAL | Status: DC
  Administered 2013-11-10 – 2013-11-13 (×10): 500 mg via ORAL
  Filled 2013-11-08 (×12): qty 1

## 2013-11-08 MED ORDER — CALCIUM CARBONATE ANTACID 500 MG PO CHEW
2.0000 | CHEWABLE_TABLET | ORAL | Status: DC | PRN
  Administered 2013-11-09: 400 mg via ORAL
  Filled 2013-11-08: qty 1

## 2013-11-08 MED ORDER — LACTATED RINGERS IV SOLN
INTRAVENOUS | Status: DC
  Administered 2013-11-08 – 2013-11-10 (×5): via INTRAVENOUS

## 2013-11-08 MED ORDER — FENTANYL CITRATE 0.05 MG/ML IJ SOLN
100.0000 ug | Freq: Once | INTRAMUSCULAR | Status: AC
  Administered 2013-11-08: 100 ug via INTRAVENOUS
  Filled 2013-11-08: qty 2

## 2013-11-08 MED ORDER — DOCUSATE SODIUM 100 MG PO CAPS
100.0000 mg | ORAL_CAPSULE | Freq: Every day | ORAL | Status: DC
  Administered 2013-11-09 – 2013-11-13 (×5): 100 mg via ORAL
  Filled 2013-11-08 (×5): qty 1

## 2013-11-08 MED ORDER — ACETAMINOPHEN 325 MG PO TABS
650.0000 mg | ORAL_TABLET | ORAL | Status: DC | PRN
Start: 2013-11-08 — End: 2013-11-14
  Administered 2013-11-14: 650 mg via ORAL
  Filled 2013-11-08: qty 2

## 2013-11-08 MED ORDER — PRENATAL MULTIVITAMIN CH
1.0000 | ORAL_TABLET | Freq: Every day | ORAL | Status: DC
  Administered 2013-11-09 – 2013-11-13 (×5): 1 via ORAL
  Filled 2013-11-08 (×5): qty 1

## 2013-11-08 MED ORDER — AZITHROMYCIN 250 MG PO TABS
500.0000 mg | ORAL_TABLET | Freq: Every day | ORAL | Status: DC
  Administered 2013-11-10 – 2013-11-13 (×4): 500 mg via ORAL
  Filled 2013-11-08 (×4): qty 2

## 2013-11-08 MED ORDER — AZITHROMYCIN 500 MG IV SOLR
500.0000 mg | INTRAVENOUS | Status: AC
  Administered 2013-11-08 – 2013-11-09 (×2): 500 mg via INTRAVENOUS
  Filled 2013-11-08 (×2): qty 500

## 2013-11-08 MED ORDER — ZOLPIDEM TARTRATE 5 MG PO TABS
5.0000 mg | ORAL_TABLET | Freq: Every evening | ORAL | Status: DC | PRN
  Administered 2013-11-08 – 2013-11-09 (×2): 5 mg via ORAL
  Filled 2013-11-08 (×2): qty 1

## 2013-11-08 MED ORDER — SODIUM CHLORIDE 0.9 % IV SOLN
2.0000 g | Freq: Four times a day (QID) | INTRAVENOUS | Status: AC
  Administered 2013-11-08 – 2013-11-10 (×8): 2 g via INTRAVENOUS
  Filled 2013-11-08 (×8): qty 2000

## 2013-11-08 NOTE — H&P (Addendum)
Cindy Armstrong is a 23 y.o. female G2P0101 at [redacted]w[redacted]d presenting secondary to PPROM at 1800 this evening. Patient with prenatal care in [facility] since 12 weeks. Prenatal care complicated by a history of preterm delivery currently receiving 17-P. Patient had a recent admission secondary to preterm labor. She has received 2 courses of BMZ thus far (7/28 &7/29 and 8/30 & 8/31). Patient reports leakage of clear fluid with some occasional contractions. Patient is otherwise without complaints. Maternal Medical History:  Reason for admission: Rupture of membranes.     OB History   Grav Para Term Preterm Abortions TAB SAB Ect Mult Living   2 1 0 1 0 0 0 0 0 1      Past Medical History  Diagnosis Date  . No pertinent past medical history   . Preterm labor   . Anemia    Past Surgical History  Procedure Laterality Date  . No past surgeries    . Wisdom teeth removal (11/07/13) Bilateral    Family History: family history includes Diabetes in her mother; Drug abuse in her mother; Learning disabilities in her brother. There is no history of Alcohol abuse, Arthritis, Asthma, Birth defects, Cancer, COPD, Depression, Early death, Hearing loss, Heart disease, Hyperlipidemia, Hypertension, Kidney disease, Mental illness, Mental retardation, Miscarriages / Stillbirths, Stroke, Vision loss, or Other. Social History:  reports that she has quit smoking. She has never used smokeless tobacco. She reports that she does not drink alcohol or use illicit drugs.   Prenatal Transfer Tool  Maternal Diabetes: No Genetic Screening: Normal Maternal Ultrasounds/Referrals: Normal Fetal Ultrasounds or other Referrals:  Other: bilateral fetal pyelectasis (6 mm) Maternal Substance Abuse:  No Significant Maternal Medications:  None Significant Maternal Lab Results:  None Other Comments:  None  Review of Systems  All other systems reviewed and are negative.     Blood pressure 116/61, pulse 85, temperature 98.3 F (36.8  C), temperature source Oral, height 5\' 3"  (1.6 m), weight 128 lb (58.06 kg), last menstrual period 03/21/2013, not currently breastfeeding. Exam Physical Exam  GENERAL: Well-developed, well-nourished female in no acute distress.  ABDOMEN: Soft, nontender, gravid PELVIC: Normal external female genitalia. Vagina is pink and rugated.  Normal discharge. Grossly ruptures with leakage of clear fluid. Cervix visibly dilated to 2 cm. On 10/12/2013 patient was 4/50/ballotable EXTREMITIES: No cyanosis, clubbing, or edema, 2+ distal pulses.  FHT: baseline 140, mod variability, +accels, no decels Toco: irregular contractions Bedside ultrasound demonstrates fetus in cephalic position  Prenatal labs: ABO, Rh: O/POS/-- (05/05 0955) Antibody: NEG (05/05 0955) Rubella: 1.64 (05/05 0955) RPR: NON REAC (08/27 1430)  HBsAg: NEGATIVE (05/05 0955)  HIV: NONREACTIVE (08/27 1430)  GBS:   collected on 11/08/2013  Assessment/Plan: 23 yo G2P0101 at [redacted]w[redacted]d with PPROM - Will admit to antepartum - Latency antibiotics - Monitor for si/sx of chorioamnionitis - Deliver for chorio, labor, or [redacted] weeks gestation - Patient previously received betamethasone    Fredie Majano 11/08/2013, 6:52 PM

## 2013-11-08 NOTE — MAU Note (Signed)
Reports water broke about 10 to 15 minutes before arrival to MAU. Entire seat of pants stained with fluid upon arrival. Feeling contractions about 4-5 minutes apart.

## 2013-11-09 LAB — GC/CHLAMYDIA PROBE AMP
CT PROBE, AMP APTIMA: NEGATIVE
GC Probe RNA: NEGATIVE

## 2013-11-09 LAB — HIV ANTIBODY (ROUTINE TESTING W REFLEX): HIV: NONREACTIVE

## 2013-11-09 MED ORDER — FENTANYL CITRATE 0.05 MG/ML IJ SOLN
100.0000 ug | Freq: Once | INTRAMUSCULAR | Status: AC
  Administered 2013-11-09: 100 ug via INTRAVENOUS
  Filled 2013-11-09: qty 2

## 2013-11-09 NOTE — Progress Notes (Signed)
[place] COMPREHENSIVE PROGRESS NOTE  Cindy Armstrong is a 23 y.o. G2P0101 at [redacted]w[redacted]d  who is admitted for PPROM at 1800 11/08/2013.  Estimated Date of Delivery: 12/26/13 Fetal presentation is cephalic.  Length of Stay:  1 Days. 11/08/2013  Subjective:  Patient reports good fetal movement.  She reports some uterine contractions, no bleeding and still leaking some.  Had regular contractions overnight, resolved with 142mcg of fentanyl and ambien, was able to sleep comfortable.  Vitals:  Blood pressure 129/62, pulse 84, temperature 98.1 F (36.7 C), temperature source Oral, resp. rate 18, height 5\' 3"  (1.6 m), weight 128 lb (58.06 kg), last menstrual period 03/21/2013, not currently breastfeeding. Physical Examination: General appearance - alert, well appearing, and in no distress Mental status - alert, oriented to person, place, and time Eyes - pupils equal and reactive, extraocular eye movements intact Abdomen - soft, nontender, nondistended, no masses or organomegaly Extremities - peripheral pulses normal, no pedal edema, no clubbing or cyanosis Cervical Exam: Not evaluated.  Extremities: extremities normal, atraumatic, no cyanosis or edema   Fetal Monitoring:  Baseline: 130 bpm, Variability: Good {> 6 bpm), Accelerations: Reactive, Decelerations: Absent and contracting q1-2 overnight, rarely this morning  Results for orders placed during the hospital encounter of 11/08/13 (from the past 24 hour(s))  HIV ANTIBODY (ROUTINE TESTING)   Collection Time    11/08/13  6:44 PM      Result Value Ref Range   HIV 1&2 Ab, 4th Generation NONREACTIVE  NONREACTIVE  WET PREP, GENITAL   Collection Time    11/08/13  6:46 PM      Result Value Ref Range   Yeast Wet Prep HPF POC NONE SEEN  NONE SEEN   Trich, Wet Prep NONE SEEN  NONE SEEN   Clue Cells Wet Prep HPF POC NONE SEEN  NONE SEEN   WBC, Wet Prep HPF POC MODERATE (*) NONE SEEN    No results found.  Marland Kitchen ampicillin (OMNIPEN)  IV  2 g Intravenous Q6H   Followed by  . [START ON 11/10/2013] amoxicillin  500 mg Oral Q8H  . azithromycin  500 mg Intravenous Q24H   Followed by  . [START ON 11/10/2013] azithromycin  500 mg Oral QHS  . docusate sodium  100 mg Oral Daily  . prenatal multivitamin  1 tablet Oral Q1200   I have reviewed the patient's current medications. Prior to Admission:  Facility-administered medications prior to admission  Medication Dose Route Frequency Provider Last Rate Last Dose  . hydroxyprogesterone caproate (DELALUTIN) 250 mg/mL injection 250 mg  250 mg Intramuscular Weekly Peggy Constant, MD   250 mg at 11/03/13 0845   Prescriptions prior to admission  Medication Sig Dispense Refill  . acetaminophen (TYLENOL 8 HOUR) 650 MG CR tablet Take 1 tablet (650 mg total) by mouth every 8 (eight) hours as needed for pain.  30 tablet  0  . amoxicillin (AMOXIL) 500 MG capsule Take 1 capsule (500 mg total) by mouth 3 (three) times daily.  21 capsule  0  . NIFEdipine (PROCARDIA-XL/ADALAT CC) 30 MG 24 hr tablet Take 1 tablet (30 mg total) by mouth 2 (two) times daily.  60 tablet  2  . Prenatal Vit-Fe Fumarate-FA (PRENATAL MULTIVITAMIN) TABS tablet Take 1 tablet by mouth daily at 12 noon.  30 tablet  12   Scheduled: . ampicillin (OMNIPEN) IV  2 g Intravenous Q6H   Followed by  . [START ON 11/10/2013] amoxicillin  500 mg Oral Q8H  . azithromycin  500 mg Intravenous Q24H   Followed by  . [START ON 11/10/2013] azithromycin  500 mg Oral QHS  . docusate sodium  100 mg Oral Daily  . prenatal multivitamin  1 tablet Oral Q1200   BMZ: s/p 2 courses, 7/28-29, 8/30-31.   ASSESSMENT: Patient Active Problem List   Diagnosis Date Noted  . Preterm premature rupture of membranes in third trimester 11/08/2013  . Preterm labor 10/09/2013  . GBS (group B Streptococcus carrier), +RV culture, currently pregnant 09/08/2013  . Short cervix in second trimester, antepartum 09/07/2013  . Threatened preterm labor 09/06/2013   . Abnormal fetal ultrasound 08/12/2013  . Pregnancy with history of pre-term labor 07/20/2013  . Pyelonephritis complicating pregnancy in second trimester, antepartum 07/15/2013    PLAN: Continue care on Antenatal Unit Day 2 of latency abx Continue routine antenatal care. Deliver for chorio, labor, or 34w gestation  Merla Riches, MD [facility], [place]

## 2013-11-09 NOTE — Progress Notes (Signed)
I received a referral from pt's RN.  Pt was emotional during visit and reported that going through pre-term labor with a second child is very difficult.  She is separated from her 22 year old and her family and she is frustrated by what she called the "waiting game."  She knows what it is like to have a baby in the NICU, and she was hoping to not have to do that this time.  She has good family support.  Her parents live close by and her fiance's parents also live close by.  She is looking forward to having her son visit this afternoon.    I offered reflective listening and spiritual support throughout the visit.  We will continue to follow up with her as we are able, but please also page as needs arise.  K[place]ley Pager, [phone] 12:15 PM   11/09/13 1200  Clinical Encounter Type  Visited With Patient  Visit Type Spiritual support  Referral From Nurse  Spiritual Encounters  Spiritual Needs Emotional

## 2013-11-10 ENCOUNTER — Ambulatory Visit: Payer: Medicaid Other

## 2013-11-10 DIAGNOSIS — O42913 Preterm premature rupture of membranes, unspecified as to length of time between rupture and onset of labor, third trimester: Principal | ICD-10-CM

## 2013-11-10 LAB — CBC
HEMATOCRIT: 30.7 % — AB (ref 36.0–46.0)
Hemoglobin: 10.4 g/dL — ABNORMAL LOW (ref 12.0–15.0)
MCH: 30.5 pg (ref 26.0–34.0)
MCHC: 33.9 g/dL (ref 30.0–36.0)
MCV: 90 fL (ref 78.0–100.0)
Platelets: 193 10*3/uL (ref 150–400)
RBC: 3.41 MIL/uL — AB (ref 3.87–5.11)
RDW: 13.5 % (ref 11.5–15.5)
WBC: 9.1 10*3/uL (ref 4.0–10.5)

## 2013-11-10 NOTE — Progress Notes (Signed)
NST completed.  Reactive and reassuring.  17P/hydroxyprogesterone 10ml IM in right gluteal given.  Medicine sent from Clinic to give.

## 2013-11-10 NOTE — Progress Notes (Addendum)
[place] COMPREHENSIVE PROGRESS NOTE  Cindy Armstrong is a 23 y.o. G2P0101 at [redacted]w[redacted]d  who is admitted for PPROM at 1800 11/08/2013.  Estimated Date of Delivery: 12/26/13 Fetal presentation is cephalic.  Length of Stay:  2 Days. 11/08/2013  Subjective:  Patient reports good fetal movement.  She reports some uterine contractions, no bleeding and still leaking some.  Vitals:  Blood pressure 123/65, pulse 85, temperature 98.2 F (36.8 C), temperature source Oral, resp. rate 16, height 5\' 3"  (1.6 m), weight 128 lb (58.06 kg), last menstrual period 03/21/2013, not currently breastfeeding. Physical Examination: General appearance - alert, well appearing, and in no distress Mental status - alert, oriented to person, place, and time Eyes - pupils equal and reactive, extraocular eye movements intact Abdomen - soft, nontender, nondistended, no masses or organomegaly Extremities - peripheral pulses normal, no pedal edema, no clubbing or cyanosis Cervical Exam: Not evaluated.  Extremities: extremities normal, atraumatic, no cyanosis or edema   Fetal Monitoring:  Baseline: 130 bpm, Variability: Moderate  (>6 bpm), Accelerations: Reactive, Decelerations: Absent and contracting q3-6/hour  No results found for this or any previous visit (from the past 24 hour(s)).  No results found.  Marland Kitchen ampicillin (OMNIPEN) IV  2 g Intravenous Q6H   Followed by  . amoxicillin  500 mg Oral Q8H  . azithromycin  500 mg Oral QHS  . docusate sodium  100 mg Oral Daily  . prenatal multivitamin  1 tablet Oral Q1200   I have reviewed the patient's current medications. Prior to Admission:  Facility-administered medications prior to admission  Medication Dose Route Frequency Provider Last Rate Last Dose  . hydroxyprogesterone caproate (DELALUTIN) 250 mg/mL injection 250 mg  250 mg Intramuscular Weekly Peggy Constant, MD   250 mg at 11/03/13 0845   Prescriptions prior to admission  Medication Sig  Dispense Refill  . acetaminophen (TYLENOL 8 HOUR) 650 MG CR tablet Take 1 tablet (650 mg total) by mouth every 8 (eight) hours as needed for pain.  30 tablet  0  . amoxicillin (AMOXIL) 500 MG capsule Take 1 capsule (500 mg total) by mouth 3 (three) times daily.  21 capsule  0  . NIFEdipine (PROCARDIA-XL/ADALAT CC) 30 MG 24 hr tablet Take 1 tablet (30 mg total) by mouth 2 (two) times daily.  60 tablet  2  . Prenatal Vit-Fe Fumarate-FA (PRENATAL MULTIVITAMIN) TABS tablet Take 1 tablet by mouth daily at 12 noon.  30 tablet  12   Scheduled: . ampicillin (OMNIPEN) IV  2 g Intravenous Q6H   Followed by  . amoxicillin  500 mg Oral Q8H  . azithromycin  500 mg Oral QHS  . docusate sodium  100 mg Oral Daily  . prenatal multivitamin  1 tablet Oral Q1200   BMZ: s/p 2 courses, 7/28-29, 8/30-31.   ASSESSMENT: Patient Active Problem List   Diagnosis Date Noted  . Preterm premature rupture of membranes in third trimester 11/08/2013  . Preterm labor 10/09/2013  . GBS (group B Streptococcus carrier), +RV culture, currently pregnant 09/08/2013  . Short cervix in second trimester, antepartum 09/07/2013  . Threatened preterm labor 09/06/2013  . Abnormal fetal ultrasound 08/12/2013  . Pregnancy with history of pre-term labor 07/20/2013  . Pyelonephritis complicating pregnancy in second trimester, antepartum 07/15/2013    PLAN: Continue care on Antenatal Unit Continue latency abx; patient to get last 17P dose today. Continue routine antenatal care Deliver for chorioamnionitis, labor, or 34w gestation   Osborne Oman, MD Faculty Practice, [facility]  of [facility]

## 2013-11-11 LAB — CULTURE, BETA STREP (GROUP B ONLY)

## 2013-11-11 NOTE — Progress Notes (Signed)
[place] COMPREHENSIVE PROGRESS NOTE  Cindy Armstrong is a 23 y.o. G2P0101 at [redacted]w[redacted]d  who is admitted for PPROM at 1800 11/08/2013.  Estimated Date of Delivery: 12/26/13 Fetal presentation is cephalic.  Length of Stay:  3 Days. 11/08/2013  Subjective: Patient reports good fetal movement.  She reports some uterine contractions, no bleeding and still leaking some.  Vitals:  Blood pressure 114/67, pulse 78, temperature 98.2 F (36.8 C), temperature source Oral, resp. rate 18, height 5\' 3"  (1.6 m), weight 128 lb (58.06 kg), last menstrual period 03/21/2013, not currently breastfeeding. Physical Examination: General appearance - alert, well appearing, and in no distress Mental status - alert, oriented to person, place, and time Eyes - pupils equal and reactive, extraocular eye movements intact Abdomen - soft, nontender, nondistended, no masses or organomegaly Extremities - peripheral pulses normal, no pedal edema, no clubbing or cyanosis Cervical Exam: Not evaluated.  Extremities: extremities normal, atraumatic, no cyanosis or edema   Fetal Monitoring:  Baseline: 130 bpm, Variability: Moderate  (>6 bpm), Accelerations: Reactive, Decelerations: Absent and contracting q3-6/hour  Results for orders placed during the hospital encounter of 11/08/13 (from the past 24 hour(s))  CBC   Collection Time    11/10/13  6:35 PM      Result Value Ref Range   WBC 9.1  4.0 - 10.5 K/uL   RBC 3.41 (*) 3.87 - 5.11 MIL/uL   Hemoglobin 10.4 (*) 12.0 - 15.0 g/dL   HCT 30.7 (*) 36.0 - 46.0 %   MCV 90.0  78.0 - 100.0 fL   MCH 30.5  26.0 - 34.0 pg   MCHC 33.9  30.0 - 36.0 g/dL   RDW 13.5  11.5 - 15.5 %   Platelets 193  150 - 400 K/uL    No results found.  Marland Kitchen amoxicillin  500 mg Oral Q8H  . azithromycin  500 mg Oral QHS  . docusate sodium  100 mg Oral Daily  . prenatal multivitamin  1 tablet Oral Q1200   I have reviewed the patient's current medications. Prior to Admission:   Facility-administered medications prior to admission  Medication Dose Route Frequency Provider Last Rate Last Dose  . hydroxyprogesterone caproate (DELALUTIN) 250 mg/mL injection 250 mg  250 mg Intramuscular Weekly Peggy Constant, MD   250 mg at 11/03/13 0845   Prescriptions prior to admission  Medication Sig Dispense Refill  . acetaminophen (TYLENOL 8 HOUR) 650 MG CR tablet Take 1 tablet (650 mg total) by mouth every 8 (eight) hours as needed for pain.  30 tablet  0  . amoxicillin (AMOXIL) 500 MG capsule Take 1 capsule (500 mg total) by mouth 3 (three) times daily.  21 capsule  0  . NIFEdipine (PROCARDIA-XL/ADALAT CC) 30 MG 24 hr tablet Take 1 tablet (30 mg total) by mouth 2 (two) times daily.  60 tablet  2  . Prenatal Vit-Fe Fumarate-FA (PRENATAL MULTIVITAMIN) TABS tablet Take 1 tablet by mouth daily at 12 noon.  30 tablet  12   Scheduled: . amoxicillin  500 mg Oral Q8H  . azithromycin  500 mg Oral QHS  . docusate sodium  100 mg Oral Daily  . prenatal multivitamin  1 tablet Oral Q1200   BMZ: s/p 2 courses, 7/28-29, 8/30-31.   ASSESSMENT: Patient Active Problem List   Diagnosis Date Noted  . Preterm premature rupture of membranes in third trimester 11/08/2013  . Preterm labor 10/09/2013  . GBS (group B Streptococcus carrier), +RV culture, currently pregnant 09/08/2013  .  Short cervix in second trimester, antepartum 09/07/2013  . Threatened preterm labor 09/06/2013  . Abnormal fetal ultrasound 08/12/2013  . Pregnancy with history of pre-term labor 07/20/2013  . Pyelonephritis complicating pregnancy in second trimester, antepartum 07/15/2013    PLAN: Continue care on Antenatal Unit Continue latency abx; patient to received last 17P dose on 11/10/13. Continue routine antenatal care Deliver for chorioamnionitis, labor, or 34w gestation (11/14/13)   Osborne Oman, MD Faculty Practice, [place]

## 2013-11-12 NOTE — Progress Notes (Signed)
Patient ID: Cindy Armstrong, female   DOB: 10/21/1990, 23 y.o.   MRN: [id number] Cindy Armstrong) NOTE  Cindy Armstrong is a 23 y.o. G2P0101 at [redacted]w[redacted]d by LMP who is admitted for rupture of membranes.   Fetal presentation is cephalic Length of Stay:  4  Days  Subjective: Doing well Patient reports the fetal movement as active. Patient reports uterine contraction  activity as none. Patient reports  vaginal bleeding as none. Patient describes fluid per vagina as Clear.  Vitals:  Blood pressure 115/51, pulse 68, temperature 98.2 F (36.8 C), temperature source Oral, resp. rate 18, height 5\' 3"  (1.6 m), weight 58.06 kg (128 lb), last menstrual period 03/21/2013, not currently breastfeeding. Physical Examination:  General appearance - alert, well appearing, and in no distress Heart - normal rate and regular rhythm Abdomen - soft, nontender, nondistended Fundal Height:  size equals dates Cervical Exam: Not evaluated.  Extremities: extremities normal, atraumatic, no cyanosis or edema and Homans sign is negative, no sign of DVT  Membranes:ruptured  Fetal Monitoring:  Baseline: 135 bpm, Variability: Good {> 6 bpm), Accelerations: Reactive and Decelerations: Absent  Labs:  No results found for this or any previous visit (from the past 24 hour(s)).    Medications:  Scheduled . amoxicillin  500 mg Oral Q8H  . azithromycin  500 mg Oral QHS  . docusate sodium  100 mg Oral Daily  . prenatal multivitamin  1 tablet Oral Q1200   I have reviewed the patient's current medications.  ASSESSMENT: Patient Active Problem List   Diagnosis Date Noted  . Preterm premature rupture of membranes in third trimester 11/08/2013  . Preterm labor 10/09/2013  . GBS (group B Streptococcus carrier), +RV culture, currently pregnant 09/08/2013  . Short cervix in second trimester, antepartum 09/07/2013  . Threatened preterm labor 09/06/2013  . Abnormal fetal ultrasound 08/12/2013  .  Pregnancy with history of pre-term labor 07/20/2013  . Pyelonephritis complicating pregnancy in second trimester, antepartum 07/15/2013    PLAN: Continue care on Antenatal Unit  Continue latency abx; patient to received last 17P dose on 11/10/13.  Continue routine antenatal care  Deliver for chorioamnionitis, labor, or 34w gestation (11/14/13)   Cindy Armstrong 11/12/2013,7:02 AM

## 2013-11-13 LAB — CBC
HCT: 31.1 % — ABNORMAL LOW (ref 36.0–46.0)
Hemoglobin: 10.6 g/dL — ABNORMAL LOW (ref 12.0–15.0)
MCH: 30.5 pg (ref 26.0–34.0)
MCHC: 34.1 g/dL (ref 30.0–36.0)
MCV: 89.4 fL (ref 78.0–100.0)
Platelets: 188 10*3/uL (ref 150–400)
RBC: 3.48 MIL/uL — AB (ref 3.87–5.11)
RDW: 13.6 % (ref 11.5–15.5)
WBC: 11.5 10*3/uL — ABNORMAL HIGH (ref 4.0–10.5)

## 2013-11-13 NOTE — Progress Notes (Signed)
Cephalic presentation confirmed with ultrasound, induction scheduled for tomorrow at [place].

## 2013-11-13 NOTE — Progress Notes (Signed)
Patient ID: Cindy Armstrong, female   DOB: 1990-08-30, 23 y.o.   MRN: [id number] [place]) NOTE  Cindy Armstrong is a 23 y.o. G2P0101 at [redacted]w[redacted]d by best clinical estimate who is admitted for rupture of membranes.   Fetal presentation is cephalic. Length of Stay:  5  Days  Subjective: Doing well--feeling some contractions Patient reports the fetal movement as active. Patient reports uterine contraction  activity as irregular. Patient reports  vaginal bleeding as none. Patient describes fluid per vagina as Clear.  Vitals:  Blood pressure 110/61, pulse 71, temperature 98.1 F (36.7 C), temperature source Oral, resp. rate 18, height 5\' 3"  (1.6 m), weight 128 lb (58.06 kg), last menstrual period 03/21/2013, not currently breastfeeding. Physical Examination:  General appearance - alert, well appearing, and in no distress Abdomen - gravid NT Fundal Height:  size equals dates Extremities: extremities normal, atraumatic, no cyanosis or edema  Membranes:intact  Fetal Monitoring:  Baseline: 135 bpm, Variability: Good {> 6 bpm), Accelerations: Reactive and Decelerations: Absent   Medications:  Scheduled . amoxicillin  500 mg Oral Q8H  . azithromycin  500 mg Oral QHS  . docusate sodium  100 mg Oral Daily  . prenatal multivitamin  1 tablet Oral Q1200   I have reviewed the patient's current medications.  ASSESSMENT: Patient Active Problem List   Diagnosis Date Noted  . Preterm premature rupture of membranes in third trimester 11/08/2013  . Preterm labor 10/09/2013  . GBS (group B Streptococcus carrier), +RV culture, currently pregnant 09/08/2013  . Short cervix in second trimester, antepartum 09/07/2013  . Threatened preterm labor 09/06/2013  . Abnormal fetal ultrasound 08/12/2013  . Pregnancy with history of pre-term labor 07/20/2013  . Pyelonephritis complicating pregnancy in second trimester, antepartum 07/15/2013    PLAN: Delivery at 34 wks or  sooner with s/sx's of chorio.  Donnamae Jude, MD 11/13/2013,7:27 AM

## 2013-11-14 ENCOUNTER — Encounter (HOSPITAL_COMMUNITY): Payer: Self-pay

## 2013-11-14 ENCOUNTER — Encounter (HOSPITAL_COMMUNITY): Payer: Self-pay | Admitting: Anesthesiology

## 2013-11-14 ENCOUNTER — Inpatient Hospital Stay (HOSPITAL_COMMUNITY): Admit: 2013-11-14 | Payer: Medicaid Other

## 2013-11-14 DIAGNOSIS — O09213 Supervision of pregnancy with history of pre-term labor, third trimester: Secondary | ICD-10-CM

## 2013-11-14 DIAGNOSIS — Z3A34 34 weeks gestation of pregnancy: Secondary | ICD-10-CM

## 2013-11-14 DIAGNOSIS — O080 Genital tract and pelvic infection following ectopic and molar pregnancy: Secondary | ICD-10-CM

## 2013-11-14 LAB — RPR

## 2013-11-14 LAB — CBC
HCT: 32.1 % — ABNORMAL LOW (ref 36.0–46.0)
Hemoglobin: 11.2 g/dL — ABNORMAL LOW (ref 12.0–15.0)
MCH: 30.9 pg (ref 26.0–34.0)
MCHC: 34.9 g/dL (ref 30.0–36.0)
MCV: 88.7 fL (ref 78.0–100.0)
Platelets: 196 10*3/uL (ref 150–400)
RBC: 3.62 MIL/uL — ABNORMAL LOW (ref 3.87–5.11)
RDW: 13.4 % (ref 11.5–15.5)
WBC: 17.4 10*3/uL — AB (ref 4.0–10.5)

## 2013-11-14 LAB — TYPE AND SCREEN
ABO/RH(D): O POS
ANTIBODY SCREEN: NEGATIVE

## 2013-11-14 MED ORDER — ZOLPIDEM TARTRATE 5 MG PO TABS: 5.0000 mg | ORAL_TABLET | Freq: Every evening | ORAL | Status: DC | PRN

## 2013-11-14 MED ORDER — OXYCODONE-ACETAMINOPHEN 5-325 MG PO TABS
1.0000 | ORAL_TABLET | ORAL | Status: DC | PRN
  Administered 2013-11-14: 1 via ORAL
  Filled 2013-11-14: qty 1

## 2013-11-14 MED ORDER — LACTATED RINGERS IV BOLUS (SEPSIS)
500.0000 mL | Freq: Once | INTRAVENOUS | Status: AC
  Administered 2013-11-14: 500 mL via INTRAVENOUS

## 2013-11-14 MED ORDER — LACTATED RINGERS IV SOLN: 500.0000 mL | INTRAVENOUS | Status: DC | PRN

## 2013-11-14 MED ORDER — EPHEDRINE 5 MG/ML INJ
10.0000 mg | INTRAVENOUS | Status: DC | PRN
  Filled 2013-11-14: qty 2

## 2013-11-14 MED ORDER — PHENYLEPHRINE 40 MCG/ML (10ML) SYRINGE FOR IV PUSH (FOR BLOOD PRESSURE SUPPORT)
80.0000 ug | PREFILLED_SYRINGE | INTRAVENOUS | Status: DC | PRN
  Filled 2013-11-14: qty 2

## 2013-11-14 MED ORDER — IBUPROFEN 600 MG PO TABS
600.0000 mg | ORAL_TABLET | Freq: Four times a day (QID) | ORAL | Status: DC
  Administered 2013-11-14 – 2013-11-15 (×6): 600 mg via ORAL
  Filled 2013-11-14 (×6): qty 1

## 2013-11-14 MED ORDER — SENNOSIDES-DOCUSATE SODIUM 8.6-50 MG PO TABS
2.0000 | ORAL_TABLET | ORAL | Status: DC
  Administered 2013-11-15: 2 via ORAL
  Filled 2013-11-14: qty 2

## 2013-11-14 MED ORDER — DIPHENHYDRAMINE HCL 50 MG/ML IJ SOLN: 12.5000 mg | INTRAMUSCULAR | Status: DC | PRN

## 2013-11-14 MED ORDER — WITCH HAZEL-GLYCERIN EX PADS: 1.0000 "application " | MEDICATED_PAD | CUTANEOUS | Status: DC | PRN

## 2013-11-14 MED ORDER — FENTANYL 2.5 MCG/ML BUPIVACAINE 1/10 % EPIDURAL INFUSION (WH - ANES)
14.0000 mL/h | INTRAMUSCULAR | Status: DC | PRN
Start: 2013-11-14 — End: 2013-11-14

## 2013-11-14 MED ORDER — DIBUCAINE 1 % RE OINT: 1.0000 "application " | TOPICAL_OINTMENT | RECTAL | Status: DC | PRN

## 2013-11-14 MED ORDER — PHENYLEPHRINE 40 MCG/ML (10ML) SYRINGE FOR IV PUSH (FOR BLOOD PRESSURE SUPPORT)
PREFILLED_SYRINGE | INTRAVENOUS | Status: DC
Start: 2013-11-14 — End: 2013-11-14
  Filled 2013-11-14: qty 10

## 2013-11-14 MED ORDER — LACTATED RINGERS IV SOLN: 500.0000 mL | Freq: Once | INTRAVENOUS | Status: DC

## 2013-11-14 MED ORDER — OXYTOCIN BOLUS FROM INFUSION: 500.0000 mL | INTRAVENOUS | Status: DC

## 2013-11-14 MED ORDER — BENZOCAINE-MENTHOL 20-0.5 % EX AERO
1.0000 "application " | INHALATION_SPRAY | CUTANEOUS | Status: DC | PRN
  Administered 2013-11-14: 1 via TOPICAL
  Filled 2013-11-14: qty 56

## 2013-11-14 MED ORDER — LIDOCAINE HCL (PF) 1 % IJ SOLN
30.0000 mL | INTRAMUSCULAR | Status: DC | PRN
  Filled 2013-11-14: qty 30

## 2013-11-14 MED ORDER — PRENATAL MULTIVITAMIN CH
1.0000 | ORAL_TABLET | Freq: Every day | ORAL | Status: DC
  Administered 2013-11-14 – 2013-11-15 (×2): 1 via ORAL
  Filled 2013-11-14 (×2): qty 1

## 2013-11-14 MED ORDER — OXYTOCIN 40 UNITS IN LACTATED RINGERS INFUSION - SIMPLE MED
62.5000 mL/h | INTRAVENOUS | Status: DC
  Administered 2013-11-14: 62.5 mL/h via INTRAVENOUS
  Filled 2013-11-14: qty 1000

## 2013-11-14 MED ORDER — ONDANSETRON HCL 4 MG/2ML IJ SOLN: 4.0000 mg | Freq: Four times a day (QID) | INTRAMUSCULAR | Status: DC | PRN

## 2013-11-14 MED ORDER — LANOLIN HYDROUS EX OINT: TOPICAL_OINTMENT | CUTANEOUS | Status: DC | PRN

## 2013-11-14 MED ORDER — ONDANSETRON HCL 4 MG PO TABS: 4.0000 mg | ORAL_TABLET | ORAL | Status: DC | PRN

## 2013-11-14 MED ORDER — CITRIC ACID-SODIUM CITRATE 334-500 MG/5ML PO SOLN: 30.0000 mL | ORAL | Status: DC | PRN

## 2013-11-14 MED ORDER — SIMETHICONE 80 MG PO CHEW: 80.0000 mg | CHEWABLE_TABLET | ORAL | Status: DC | PRN

## 2013-11-14 MED ORDER — FLEET ENEMA 7-19 GM/118ML RE ENEM: 1.0000 | ENEMA | RECTAL | Status: DC | PRN

## 2013-11-14 MED ORDER — DIPHENHYDRAMINE HCL 25 MG PO CAPS: 25.0000 mg | ORAL_CAPSULE | Freq: Four times a day (QID) | ORAL | Status: DC | PRN

## 2013-11-14 MED ORDER — ACETAMINOPHEN 325 MG PO TABS: 650.0000 mg | ORAL_TABLET | ORAL | Status: DC | PRN

## 2013-11-14 MED ORDER — TETANUS-DIPHTH-ACELL PERTUSSIS 5-2.5-18.5 LF-MCG/0.5 IM SUSP
0.5000 mL | Freq: Once | INTRAMUSCULAR | Status: DC
  Filled 2013-11-14: qty 0.5

## 2013-11-14 MED ORDER — ONDANSETRON HCL 4 MG/2ML IJ SOLN: 4.0000 mg | INTRAMUSCULAR | Status: DC | PRN

## 2013-11-14 MED ORDER — OXYCODONE-ACETAMINOPHEN 5-325 MG PO TABS
2.0000 | ORAL_TABLET | ORAL | Status: DC | PRN
  Administered 2013-11-14: 2 via ORAL
  Filled 2013-11-14: qty 2

## 2013-11-14 MED ORDER — OXYCODONE-ACETAMINOPHEN 5-325 MG PO TABS: 1.0000 | ORAL_TABLET | ORAL | Status: DC | PRN

## 2013-11-14 MED ORDER — LACTATED RINGERS IV SOLN
INTRAVENOUS | Status: DC
  Administered 2013-11-14: 03:00:00 via INTRAVENOUS

## 2013-11-14 MED ORDER — FENTANYL 2.5 MCG/ML BUPIVACAINE 1/10 % EPIDURAL INFUSION (WH - ANES)
INTRAMUSCULAR | Status: AC
  Filled 2013-11-14: qty 125

## 2013-11-14 MED ORDER — LACTATED RINGERS IV SOLN
INTRAVENOUS | Status: DC
  Administered 2013-11-14: 01:00:00 via INTRAVENOUS

## 2013-11-14 MED ORDER — OXYCODONE-ACETAMINOPHEN 5-325 MG PO TABS: 2.0000 | ORAL_TABLET | ORAL | Status: DC | PRN

## 2013-11-14 NOTE — Progress Notes (Signed)
Pt did not receive epidural

## 2013-11-14 NOTE — Lactation Note (Signed)
This note was copied from the chart of [place]. Lactation Consultation Note  Initial visit made.  Mom given booklet Providing Breastmilk for your Baby in NICU.  Mom states she has pumped once and obtained a small amount of colostrum.  When I inquired about a pump at home she states she only plans to pump while she is in the hospital and then give formula.  Discussed considering continuing pumping at least for the time baby is in the NICU.  She will notify us if she needs a [facility] referral.  Reviewed pump use and encouraged to pump every 3 hours for 15 minutes.  Patient Name: Cindy Armstrong [id number] Date: 11/14/2013 Reason for consult: Initial assessment;NICU baby;Late preterm infant   Maternal Data    Feeding    LATCH Score/Interventions                      Lactation Tools Discussed/Used WIC Program: Yes Pump Review: Setup, frequency, and cleaning Initiated by:: RN Date initiated:: 11/15/13   Consult Status Consult Status: Follow-up Follow-up type: In-patient    Ave Filter 11/14/2013, 2:42 PM

## 2013-11-14 NOTE — Progress Notes (Signed)
Continued stay review completed.

## 2013-11-14 NOTE — Progress Notes (Signed)
Breast pump reviewed with patient.  Patient reports two years since last used breast pump.  Will continue to monitor.  Leighton Roach, RN----------------

## 2013-11-14 NOTE — Anesthesia Preprocedure Evaluation (Addendum)
Anesthesia Evaluation  Patient identified by MRN, date of birth, ID band Patient awake    Reviewed: Allergy & Precautions, H&P , NPO status , Patient's Chart, lab work & pertinent test results  Airway Mallampati: II TM Distance: >3 FB Neck ROM: Full    Dental no notable dental hx. (+) Teeth Intact   Pulmonary former smoker,  breath sounds clear to auscultation  Pulmonary exam normal       Cardiovascular negative cardio ROS  Rhythm:Regular Rate:Normal     Neuro/Psych negative neurological ROS  negative psych ROS   GI/Hepatic negative GI ROS,   Endo/Other  negative endocrine ROS  Renal/GU Renal diseaseAcute Pyelonephritis 2 weeks ago  negative genitourinary   Musculoskeletal negative musculoskeletal ROS (+)   Abdominal   Peds  Hematology  (+) anemia ,   Anesthesia Other Findings   Reproductive/Obstetrics (+) Pregnancy PTL 31 weeks                          Anesthesia Physical Anesthesia Plan  ASA: II  Anesthesia Plan: Epidural   Post-op Pain Management:    Induction:   Airway Management Planned: Natural Airway  Additional Equipment:   Intra-op Plan:   Post-operative Plan:   Informed Consent: I have reviewed the patients History and Physical, chart, labs and discussed the procedure including the risks, benefits and alternatives for the proposed anesthesia with the patient or authorized representative who has indicated his/her understanding and acceptance.     Plan Discussed with: Anesthesiologist  Anesthesia Plan Comments: (Patient delivered prior to insertion of epidural. No procedure performed. M. Royce Macadamia, MD)       Anesthesia Quick Evaluation

## 2013-11-15 LAB — CBC
HCT: 30.8 % — ABNORMAL LOW (ref 36.0–46.0)
HEMOGLOBIN: 10.5 g/dL — AB (ref 12.0–15.0)
MCH: 30.5 pg (ref 26.0–34.0)
MCHC: 34.1 g/dL (ref 30.0–36.0)
MCV: 89.5 fL (ref 78.0–100.0)
Platelets: 212 10*3/uL (ref 150–400)
RBC: 3.44 MIL/uL — ABNORMAL LOW (ref 3.87–5.11)
RDW: 13.6 % (ref 11.5–15.5)
WBC: 17.5 10*3/uL — ABNORMAL HIGH (ref 4.0–10.5)

## 2013-11-15 MED ORDER — IBUPROFEN 600 MG PO TABS: 600.0000 mg | ORAL_TABLET | Freq: Four times a day (QID) | ORAL | Status: DC | PRN

## 2013-11-15 NOTE — Progress Notes (Signed)
Pt teaching complete  Ambulated out to nicu

## 2013-11-15 NOTE — Progress Notes (Signed)
CSW attempted to meet with MOB to introduce CSW services, offer support and complete assessment due to NICU admission at 34 weeks, but she was not in her room at this time.  CSW will attempt to follow up at a later time.

## 2013-11-15 NOTE — Discharge Instructions (Signed)
Levonorgestrel intrauterine device (IUD) What is this medicine? LEVONORGESTREL IUD (LEE voe nor jes trel) is a contraceptive (birth control) device. The device is placed inside the uterus by a healthcare professional. It is used to prevent pregnancy and can also be used to treat heavy bleeding that occurs during your period. Depending on the device, it can be used for 3 to 5 years. This medicine may be used for other purposes; ask your health care provider or pharmacist if you have questions. COMMON BRAND NAME(S): LILETTA, Mirena, Skyla What should I tell my health care provider before I take this medicine? They need to know if you have any of these conditions: -abnormal Pap smear -cancer of the breast, uterus, or cervix -diabetes -endometritis -genital or pelvic infection now or in the past -have more than one sexual partner or your partner has more than one partner -heart disease -history of an ectopic or tubal pregnancy -immune system problems -IUD in place -liver disease or tumor -problems with blood clots or take blood-thinners -use intravenous drugs -uterus of unusual shape -vaginal bleeding that has not been explained -an unusual or allergic reaction to levonorgestrel, other hormones, silicone, or polyethylene, medicines, foods, dyes, or preservatives -pregnant or trying to get pregnant -breast-feeding How should I use this medicine? This device is placed inside the uterus by a health care professional. Talk to your pediatrician regarding the use of this medicine in children. Special care may be needed. Overdosage: If you think you have taken too much of this medicine contact a poison control center or emergency room at once. NOTE: This medicine is only for you. Do not share this medicine with others. What if I miss a dose? This does not apply. What may interact with this medicine? Do not take this medicine with any of the following  medications: -amprenavir -bosentan -fosamprenavir This medicine may also interact with the following medications: -aprepitant -barbiturate medicines for inducing sleep or treating seizures -bexarotene -griseofulvin -medicines to treat seizures like carbamazepine, ethotoin, felbamate, oxcarbazepine, phenytoin, topiramate -modafinil -pioglitazone -rifabutin -rifampin -rifapentine -some medicines to treat HIV infection like atazanavir, indinavir, lopinavir, nelfinavir, tipranavir, ritonavir -St. John's wort -warfarin This list may not describe all possible interactions. Give your health care provider a list of all the medicines, herbs, non-prescription drugs, or dietary supplements you use. Also tell them if you smoke, drink alcohol, or use illegal drugs. Some items may interact with your medicine. What should I watch for while using this medicine? Visit your doctor or health care professional for regular check ups. See your doctor if you or your partner has sexual contact with others, becomes HIV positive, or gets a sexual transmitted disease. This product does not protect you against HIV infection (AIDS) or other sexually transmitted diseases. You can check the placement of the IUD yourself by reaching up to the top of your vagina with clean fingers to feel the threads. Do not pull on the threads. It is a good habit to check placement after each menstrual period. Call your doctor right away if you feel more of the IUD than just the threads or if you cannot feel the threads at all. The IUD may come out by itself. You may become pregnant if the device comes out. If you notice that the IUD has come out use a backup birth control method like condoms and call your health care provider. Using tampons will not change the position of the IUD and are okay to use during your period. What side effects may   I notice from receiving this medicine? Side effects that you should report to your doctor or  health care professional as soon as possible: -allergic reactions like skin rash, itching or hives, swelling of the face, lips, or tongue -fever, flu-like symptoms -genital sores -high blood pressure -no menstrual period for 6 weeks during use -pain, swelling, warmth in the leg -pelvic pain or tenderness -severe or sudden headache -signs of pregnancy -stomach cramping -sudden shortness of breath -trouble with balance, talking, or walking -unusual vaginal bleeding, discharge -yellowing of the eyes or skin Side effects that usually do not require medical attention (report to your doctor or health care professional if they continue or are bothersome): -acne -breast pain -change in sex drive or performance -changes in weight -cramping, dizziness, or faintness while the device is being inserted -headache -irregular menstrual bleeding within first 3 to 6 months of use -nausea This list may not describe all possible side effects. Call your doctor for medical advice about side effects. You may report side effects to FDA at 1-800-FDA-1088. Where should I keep my medicine? This does not apply. NOTE: This sheet is a summary. It may not cover all possible information. If you have questions about this medicine, talk to your doctor, pharmacist, or health care provider.  2015, Elsevier/Gold Standard. (2011-02-27 13:54:04)  

## 2013-11-15 NOTE — Lactation Note (Signed)
This note was copied from the chart of [place]. Lactation Consultation Note     Follow up consult with this mom of a NICU baby, now 24 hours old, and 34 1/7 weeks CGA. Mom is pumping and expressing up to 3 mos of colostrum. I stressed to her the benefits of EBM for her baby, and told her I hoped she will consider providing Breast milkofr this baby for more tha a month, as she did with her first. I faxed information to  [facility] for mom , to let them know that mom will be discharged today, and coming for a DEP. I reviewed with mom to pump 15-30 minutes, every 3 hours,followed by hand expression. Mom and baby will be followed in [place] by lactation.  Patient Name: Cindy Armstrong [id number] Date: 11/15/2013 Reason for consult: Follow-up assessment;NICU baby;Late preterm infant   Maternal Data    Feeding Feeding Type: Breast Milk with Formula added Nipple Type: Slow - flow Length of feed: 10 min  LATCH Score/Interventions                      Lactation Tools Discussed/Used WIC Program: Yes (information faxed to [facility] for mom to get DEP) Pump Review: Setup, frequency, and cleaning   Consult Status Consult Status: PRN Follow-up type: In-patient (NICU)    Tonna Corner 11/15/2013, 12:54 PM

## 2013-11-15 NOTE — Discharge Summary (Signed)
Obstetric Discharge Summary Reason for Admission: PPROM Prenatal Procedures: NST and ultrasound Intrapartum Procedures: spontaneous vaginal delivery Postpartum Procedures: none Complications-Operative and Postpartum: none Hemoglobin  Date Value Ref Range Status  11/15/2013 10.5* 12.0 - 15.0 g/dL Final     HCT  Date Value Ref Range Status  11/15/2013 30.8* 36.0 - 46.0 % Final   Cindy Armstrong is a 23 y.o. female G2P0101 at [redacted]w[redacted]d presenting secondary to PPROM at 1800 this evening. Patient with prenatal care in [facility] since 12 weeks. Prenatal care complicated by a history of preterm delivery currently receiving 17-P. Patient had a recent admission secondary to preterm labor. She has received 2 courses of BMZ thus far (7/28 &7/29 and 8/30 & 8/31).  Pt received latency antibiotics.  She went into labor at 34 weeks and had an uncomplicated NSVD.  Delivery Note  At 3:01 AM a viable female was delivered via Vaginal, Spontaneous Delivery (Presentation: ; Occiput Anterior). APGAR: see NICU note , ; weight pending.  Placenta status: Intact, Spontaneous. Cord: 3v with the following complications: none. Cord pH:  Anesthesia: None  Episiotomy: None  Lacerations: None  Suture Repair: none  Est. Blood Loss (mL): 100   Physical Exam:  General: alert, cooperative and no distress Lochia: appropriate Uterine Fundus: firm Incision: n/a DVT Evaluation: No evidence of DVT seen on physical exam.  Discharge Diagnoses: PPROM with delivery at 34 weeks  Discharge Information: Date: 11/15/2013 Activity: pelvic rest Diet: routine Medications: PNV and Ibuprofen Condition: stable Instructions: refer to practice specific booklet Discharge to: home   Newborn Data: Live born female  Birth Weight: 4 lb 9.8 oz (2091 g) APGAR: 8, 9  NICU for prematurity  Cindy Armstrong,[place] H. 11/15/2013, 8:44 AM

## 2013-11-17 ENCOUNTER — Encounter: Payer: Medicaid Other | Admitting: Family

## 2013-11-24 ENCOUNTER — Ambulatory Visit: Payer: Medicaid Other

## 2013-12-01 ENCOUNTER — Encounter: Payer: Medicaid Other | Admitting: Family Medicine

## 2013-12-12 ENCOUNTER — Encounter (HOSPITAL_COMMUNITY): Payer: Self-pay

## 2013-12-16 ENCOUNTER — Encounter: Payer: Self-pay | Admitting: Family Medicine

## 2013-12-16 ENCOUNTER — Ambulatory Visit (INDEPENDENT_AMBULATORY_CARE_PROVIDER_SITE_OTHER): Payer: Medicaid Other | Admitting: Family Medicine

## 2013-12-16 VITALS — BP 134/69 | HR 75 | Ht 63.0 in | Wt 124.4 lb

## 2013-12-16 DIAGNOSIS — F53 Postpartum depression: Secondary | ICD-10-CM

## 2013-12-16 DIAGNOSIS — Z30430 Encounter for insertion of intrauterine contraceptive device: Secondary | ICD-10-CM

## 2013-12-16 DIAGNOSIS — O99345 Other mental disorders complicating the puerperium: Secondary | ICD-10-CM

## 2013-12-16 LAB — POCT PREGNANCY, URINE: Preg Test, Ur: NEGATIVE

## 2013-12-16 MED ORDER — LEVONORGESTREL 20 MCG/24HR IU IUD
INTRAUTERINE_SYSTEM | Freq: Once | INTRAUTERINE | Status: AC
  Administered 2013-12-16: 09:00:00 via INTRAUTERINE

## 2013-12-16 NOTE — Patient Instructions (Signed)

## 2013-12-16 NOTE — Progress Notes (Signed)
  Subjective:     Cindy Armstrong is a 23 y.o. female who presents for a postpartum visit. She is 5 weeks postpartum following a spontaneous vaginal delivery. I have fully reviewed the prenatal and intrapartum course. The delivery was at 103 gestational weeks due to PPROM. Outcome: spontaneous vaginal delivery. Anesthesia: epidural. Postpartum course has been normal. Baby's course has been normal. Baby is feeding by bottle. Bleeding started period. Bowel function is normal. Bladder function is normal. Patient is not sexually active. Contraception method is IUD. Postpartum depression screening: positive.  Having some depression, although PPD screen was 2.  Would like to speak to someone.  No SI/HI.  The following portions of the patient's history were reviewed and updated as appropriate: allergies, current medications, past family history, past medical history, past social history, past surgical history and problem list.  Review of Systems Pertinent items are noted in HPI.   Objective:    BP 134/69 mmHg  Pulse 75  Ht 5\' 3"  (1.6 m)  Wt 124 lb 6.4 oz (56.427 kg)  BMI 22.04 kg/m2  LMP 12/14/2013 (Exact Date)  Breastfeeding? No  General:  alert, cooperative and no distress   Breasts:  inspection negative, no nipple discharge or bleeding, no masses or nodularity palpable  Lungs: clear to auscultation bilaterally  Heart:  regular rate and rhythm, S1, S2 normal, no murmur, click, rub or gallop  Abdomen: soft, non-tender; bowel sounds normal; no masses,  no organomegaly   Vulva:  normal  Vagina: normal vagina, no discharge, exudate, lesion, or erythema  Cervix:  multiparous appearance, no cervical motion tenderness and no lesions  Corpus: normal size, contour, position, consistency, mobility, non-tender  Adnexa:  not evaluated        Assessment:     normal postpartum exam. Pap smear not done at today's visit.   Plan:    1. Contraception: IUD - inserted today 2. Follow up in: 1 month for  string check or as needed.   3.  SW notified.  Will arrange.   IUD Procedure Note Patient identified, informed consent performed, signed copy in chart, time out was performed.  Urine pregnancy test negative.  Speculum placed in the vagina.  Cervix visualized.  Cleaned with Betadine x 2.  Grasped anteriorly with a single tooth tenaculum.  Uterus sounded to 8 cm.  Mirena IUD placed per manufacturer's recommendations.  Strings trimmed to 3 cm. Tenaculum was removed, good hemostasis noted.  Patient tolerated procedure well.   Patient given post procedure instructions and Mirena care card with expiration date.  Patient is asked to check IUD strings periodically and follow up in 4-6 weeks for IUD check.

## 2013-12-19 NOTE — Progress Notes (Signed)
CSW attempted to follow up with patient per request of Clinic staff regarding symptoms of depression.  A man answered the number noted in Epic and stated patient is at home and he just left the house.  He states he will have the patient return call to CSW's number when he returns home.  He was pleasant and appreciative.

## 2013-12-20 NOTE — Progress Notes (Signed)
CSW received returned call from patient.  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  Patient admits that she feels she has suffered from depressive symptoms in the past "due to things in her childhood," but has never felt comfortable opening up to anyone to talk about it before now.  CSW thanked her for sharing her feelings today and validated her feelings.  Based on the phone assessment, CSW feels patient would benefit from starting a low dose anti-depressant.  CSW also recommends outpatient counseling, however, patient feels an ongoing/frequent appointment may be overwhelming.  CSW agrees, however, encouraged patient to see this as a priority in her healthcare and asked her to consider it.  CSW recommends follow up at [facility], but requests that Clinic MD consider starting medication prior to patient being able to be seen by MD at [facility] [place].  Patient was very [employer].  She denies SI/HI and easily contracts for safety if SI/HI occur.

## 2013-12-22 NOTE — Progress Notes (Signed)
CSW spoke with Carrie/Clinic RN to request that MD read CSW's note regarding concern for patient's PPD symptoms.  CSW requested a call from MD so CSW can follow up with patient.

## 2013-12-23 ENCOUNTER — Telehealth: Payer: Self-pay | Admitting: General Practice

## 2013-12-23 ENCOUNTER — Other Ambulatory Visit: Payer: Self-pay | Admitting: Family Medicine

## 2013-12-23 MED ORDER — SERTRALINE HCL 50 MG PO TABS: 50.0000 mg | ORAL_TABLET | Freq: Every day | ORAL | Status: DC

## 2013-12-23 NOTE — Telephone Encounter (Signed)
Called patient at mobile number and message stated this person is not accepting incoming calls at this time. Called patient at home number, no answer- left message that we are trying to reach you in regards to medication, please call us back at the clinics.

## 2013-12-23 NOTE — Telephone Encounter (Signed)
-----   Message from Truett Mainland, DO sent at 12/23/2013  8:05 AM EST ----- Zoloft 50mg  started - sent to pharmacy.  Please notify patient.  ----- Message -----    From: Shelly Coss, RN    Sent: 12/22/2013   2:40 PM      To: Truett Mainland, DO  Hello! Jaclyn Shaggy the social worker talked to this patient over the phone after her pp visit with you and felt the patient could benefit from antidepressant therapy. Please let us know your recommendations so we can inform Colleen.  Thanks!

## 2013-12-23 NOTE — Telephone Encounter (Signed)
Patient called and left message stating she is returning our call. Called patient back and informed her of medication sent to her pharmacy and importance of taking the medication everyday and that it does take at least a month for her to feel the effects of the medication. Patient verbalized understanding and had no other questions

## 2014-01-26 ENCOUNTER — Encounter: Payer: Self-pay | Admitting: *Deleted

## 2014-06-19 ENCOUNTER — Telehealth: Payer: Self-pay | Admitting: General Practice

## 2014-06-19 NOTE — Telephone Encounter (Signed)
Patient called and left message stating she recently had a baby and got an IUD. States she has been on her cycle for 4 weeks now with very heavy bleeding and is concerned the IUD might have shifted or moved and would like a call back. Called patient and asked if the bleeding ever slows down and how often she is changing her pads. Patient states she has been bleeding heavy pretty much the entire 4 weeks, states it slows down for a day or two then picks back up. Patient states she is changing her pads every 2 hours. Per chart review mirena was inserted in November. Spoke to Dr Ihor Dow who recommends work in appt soon. Asked patient if she could come in Wednesday at 2pm. Patient states yes she will come then. Patient had no other questions

## 2014-06-20 ENCOUNTER — Emergency Department (HOSPITAL_COMMUNITY)
Admission: EM | Admit: 2014-06-20 | Discharge: 2014-06-20 | Disposition: A | Discharge status: Home / Self Care | Payer: Medicaid Other | Attending: Emergency Medicine | Admitting: Emergency Medicine

## 2014-06-20 ENCOUNTER — Encounter (HOSPITAL_COMMUNITY): Payer: Self-pay | Admitting: General Practice

## 2014-06-20 DIAGNOSIS — R42 Dizziness and giddiness: Secondary | ICD-10-CM | POA: Diagnosis not present

## 2014-06-20 DIAGNOSIS — Z87891 Personal history of nicotine dependence: Secondary | ICD-10-CM | POA: Diagnosis not present

## 2014-06-20 DIAGNOSIS — N730 Acute parametritis and pelvic cellulitis: Secondary | ICD-10-CM | POA: Insufficient documentation

## 2014-06-20 DIAGNOSIS — Z79899 Other long term (current) drug therapy: Secondary | ICD-10-CM | POA: Insufficient documentation

## 2014-06-20 DIAGNOSIS — Z8751 Personal history of pre-term labor: Secondary | ICD-10-CM | POA: Insufficient documentation

## 2014-06-20 DIAGNOSIS — N390 Urinary tract infection, site not specified: Secondary | ICD-10-CM | POA: Diagnosis not present

## 2014-06-20 DIAGNOSIS — R1030 Lower abdominal pain, unspecified: Secondary | ICD-10-CM | POA: Diagnosis present

## 2014-06-20 DIAGNOSIS — R110 Nausea: Secondary | ICD-10-CM | POA: Diagnosis not present

## 2014-06-20 DIAGNOSIS — Z862 Personal history of diseases of the blood and blood-forming organs and certain disorders involving the immune mechanism: Secondary | ICD-10-CM | POA: Diagnosis not present

## 2014-06-20 DIAGNOSIS — N938 Other specified abnormal uterine and vaginal bleeding: Secondary | ICD-10-CM | POA: Diagnosis not present

## 2014-06-20 LAB — CBC WITH DIFFERENTIAL/PLATELET
BASOS ABS: 0 10*3/uL (ref 0.0–0.1)
Basophils Relative: 0 % (ref 0–1)
EOS PCT: 0 % (ref 0–5)
Eosinophils Absolute: 0 10*3/uL (ref 0.0–0.7)
HCT: 37.6 % (ref 36.0–46.0)
Hemoglobin: 12.5 g/dL (ref 12.0–15.0)
Lymphocytes Relative: 13 % (ref 12–46)
Lymphs Abs: 1.9 10*3/uL (ref 0.7–4.0)
MCH: 27.8 pg (ref 26.0–34.0)
MCHC: 33.2 g/dL (ref 30.0–36.0)
MCV: 83.6 fL (ref 78.0–100.0)
Monocytes Absolute: 1.2 10*3/uL — ABNORMAL HIGH (ref 0.1–1.0)
Monocytes Relative: 8 % (ref 3–12)
Neutro Abs: 11.8 10*3/uL — ABNORMAL HIGH (ref 1.7–7.7)
Neutrophils Relative %: 79 % — ABNORMAL HIGH (ref 43–77)
PLATELETS: 213 10*3/uL (ref 150–400)
RBC: 4.5 MIL/uL (ref 3.87–5.11)
RDW: 14.9 % (ref 11.5–15.5)
WBC: 14.9 10*3/uL — ABNORMAL HIGH (ref 4.0–10.5)

## 2014-06-20 LAB — COMPREHENSIVE METABOLIC PANEL
ALBUMIN: 4 g/dL (ref 3.5–5.0)
ALT: 11 U/L — ABNORMAL LOW (ref 14–54)
AST: 16 U/L (ref 15–41)
Alkaline Phosphatase: 69 U/L (ref 38–126)
Anion gap: 10 (ref 5–15)
BILIRUBIN TOTAL: 0.8 mg/dL (ref 0.3–1.2)
BUN: 7 mg/dL (ref 6–20)
CO2: 21 mmol/L — AB (ref 22–32)
Calcium: 9.1 mg/dL (ref 8.9–10.3)
Chloride: 107 mmol/L (ref 101–111)
Creatinine, Ser: 0.93 mg/dL (ref 0.44–1.00)
GFR calc Af Amer: >60 mL/min (ref >60)
GFR calc non Af Amer: >60 mL/min (ref >60)
Glucose, Bld: 98 mg/dL (ref 70–99)
POTASSIUM: 3.7 mmol/L (ref 3.5–5.1)
Sodium: 138 mmol/L (ref 135–145)
Total Protein: 7.5 g/dL (ref 6.5–8.1)

## 2014-06-20 LAB — URINALYSIS, ROUTINE W REFLEX MICROSCOPIC
Bilirubin Urine: NEGATIVE
Glucose, UA: NEGATIVE mg/dL
Ketones, ur: 15 mg/dL — AB
NITRITE: POSITIVE — AB
Protein, ur: NEGATIVE mg/dL
Specific Gravity, Urine: 1.016 (ref 1.005–1.030)
Urobilinogen, UA: 1 mg/dL (ref 0.0–1.0)
pH: 5.5 (ref 5.0–8.0)

## 2014-06-20 LAB — WET PREP, GENITAL
Trich, Wet Prep: NONE SEEN
Yeast Wet Prep HPF POC: NONE SEEN

## 2014-06-20 LAB — URINE MICROSCOPIC-ADD ON

## 2014-06-20 LAB — I-STAT BETA HCG BLOOD, ED (MC, WL, AP ONLY)

## 2014-06-20 MED ORDER — DOXYCYCLINE HYCLATE 100 MG PO CAPS: 100.0000 mg | ORAL_CAPSULE | Freq: Two times a day (BID) | ORAL | Status: DC

## 2014-06-20 MED ORDER — SODIUM CHLORIDE 0.9 % IV BOLUS (SEPSIS)
1000.0000 mL | Freq: Once | INTRAVENOUS | Status: AC
  Administered 2014-06-20: 1000 mL via INTRAVENOUS

## 2014-06-20 MED ORDER — ONDANSETRON 4 MG PO TBDP: 4.0000 mg | ORAL_TABLET | Freq: Three times a day (TID) | ORAL | Status: DC | PRN

## 2014-06-20 MED ORDER — ONDANSETRON HCL 4 MG/2ML IJ SOLN
4.0000 mg | Freq: Once | INTRAMUSCULAR | Status: AC
  Administered 2014-06-20: 4 mg via INTRAVENOUS
  Filled 2014-06-20: qty 2

## 2014-06-20 MED ORDER — NAPROXEN 250 MG PO TABS: 250.0000 mg | ORAL_TABLET | Freq: Two times a day (BID) | ORAL | Status: DC

## 2014-06-20 MED ORDER — AZITHROMYCIN 250 MG PO TABS
1000.0000 mg | ORAL_TABLET | Freq: Once | ORAL | Status: AC
  Administered 2014-06-20: 1000 mg via ORAL
  Filled 2014-06-20: qty 4

## 2014-06-20 MED ORDER — CEFTRIAXONE SODIUM 250 MG IJ SOLR
250.0000 mg | Freq: Once | INTRAMUSCULAR | Status: AC
  Administered 2014-06-20: 250 mg via INTRAMUSCULAR
  Filled 2014-06-20: qty 250

## 2014-06-20 MED ORDER — ACETAMINOPHEN 325 MG PO TABS
650.0000 mg | ORAL_TABLET | Freq: Once | ORAL | Status: AC
  Administered 2014-06-20: 650 mg via ORAL
  Filled 2014-06-20: qty 2

## 2014-06-20 NOTE — Discharge Instructions (Signed)
Pelvic Inflammatory Disease Pelvic inflammatory disease (PID) refers to an infection in some or all of the female organs. The infection can be in the uterus, ovaries, fallopian tubes, or the surrounding tissues in the pelvis. PID can cause abdominal or pelvic pain that comes on suddenly (acute pelvic pain). PID is a serious infection because it can lead to lasting (chronic) pelvic pain or the inability to have children (infertile).  CAUSES  The infection is often caused by the normal bacteria found in the vaginal tissues. PID may also be caused by an infection that is spread during sexual contact. PID can also occur following:   The birth of a baby.   A miscarriage.   An abortion.   Major pelvic surgery.   The use of an intrauterine device (IUD).   A sexual assault.  RISK FACTORS Certain factors can put a person at higher risk for PID, such as:  Being younger than 25 years.  Being sexually active at [place] age.  Usingnonbarrier contraception.  Havingmultiple sexual partners.  Having sex with someone who has symptoms of a genital infection.  Using oral contraception. Other times, certain behaviors can increase the possibility of getting PID, such as:  Having sex during your period.  Using a vaginal douche.  Having an intrauterine device (IUD) in place. SYMPTOMS   Abdominal or pelvic pain.   Fever.   Chills.   Abnormal vaginal discharge.  Abnormal uterine bleeding.   Unusual pain shortly after finishing your period. DIAGNOSIS  Your caregiver will choose some of the following methods to make a diagnosis, such as:   Performinga physical exam and history. A pelvic exam typically reveals a very tender uterus and surrounding pelvis.   Ordering laboratory tests including a pregnancy test, blood tests, and urine test.  Orderingcultures of the vagina and cervix to check for a sexually transmitted infection (STI).  Performing an ultrasound.    Performing a laparoscopic procedure to look inside the pelvis.  TREATMENT   Antibiotic medicines may be prescribed and taken by mouth.   Sexual partners may be treated when the infection is caused by a sexually transmitted disease (STD).   Hospitalization may be needed to give antibiotics intravenously.  Surgery may be needed, but this is rare. It may take weeks until you are completely well. If you are diagnosed with PID, you should also be checked for human immunodeficiency virus (HIV). HOME CARE INSTRUCTIONS   If given, take your antibiotics as directed. Finish the medicine even if you start to feel better.   Only take over-the-counter or prescription medicines for pain, discomfort, or fever as directed by your caregiver.   Do not have sexual intercourse until treatment is completed or as directed by your caregiver. If PID is confirmed, your recent sexual partner(s) will need treatment.   Keep your follow-up appointments. SEEK MEDICAL CARE IF:   You have increased or abnormal vaginal discharge.   You need prescription medicine for your pain.   You vomit.   You cannot take your medicines.   Your partner has an STD.  SEEK IMMEDIATE MEDICAL CARE IF:   You have a fever.   You have increased abdominal or pelvic pain.   You have chills.   You have pain when you urinate.   You are not better after 72 hours following treatment.  MAKE SURE YOU:   Understand these instructions.  Will watch your condition.  Will get help right away if you are not doing well or get worse.  Document Released: 01/27/2005 Document Revised: 05/24/2012 Document Reviewed: 01/23/2011 [facility] Patient Information 2015 [place], [place]. This information is not intended to replace advice given to you by your health care provider. Make sure you discuss any questions you have with your health care provider.  Abnormal Uterine Bleeding Abnormal uterine bleeding can affect women at  various stages in life, including teenagers, women in their reproductive years, pregnant women, and women who have reached menopause. Several kinds of uterine bleeding are considered abnormal, including:  Bleeding or spotting between periods.   Bleeding after sexual intercourse.   Bleeding that is heavier or more than normal.   Periods that last longer than usual.  Bleeding after menopause.  Many cases of abnormal uterine bleeding are minor and simple to treat, while others are more serious. Any type of abnormal bleeding should be evaluated by your health care provider. Treatment will depend on the cause of the bleeding. HOME CARE INSTRUCTIONS Monitor your condition for any changes. The following actions may help to alleviate any discomfort you are experiencing:  Avoid the use of tampons and douches as directed by your health care provider.  Change your pads frequently. You should get regular pelvic exams and Pap tests. Keep all follow-up appointments for diagnostic tests as directed by your health care provider.  SEEK MEDICAL CARE IF:   Your bleeding lasts more than 1 week.   You feel dizzy at times.  SEEK IMMEDIATE MEDICAL CARE IF:   You pass out.   You are changing pads every 15 to 30 minutes.   You have abdominal pain.  You have a fever.   You become sweaty or weak.   You are passing large blood clots from the vagina.   You start to feel nauseous and vomit. MAKE SURE YOU:   Understand these instructions.  Will watch your condition.  Will get help right away if you are not doing well or get worse. Document Released: 01/27/2005 Document Revised: 02/01/2013 Document Reviewed: 08/26/2012 [facility] Patient Information 2015 [place], [place]. This information is not intended to replace advice given to you by your health care provider. Make sure you discuss any questions you have with your health care provider. Urinary Tract Infection Urinary tract infections  (UTIs) can develop anywhere along your urinary tract. Your urinary tract is your body's drainage system for removing wastes and extra water. Your urinary tract includes two kidneys, two ureters, a bladder, and a urethra. Your kidneys are a pair of bean-shaped organs. Each kidney is about the size of your fist. They are located below your ribs, one on each side of your spine. CAUSES Infections are caused by microbes, which are microscopic organisms, including fungi, viruses, and bacteria. These organisms are so small that they can only be seen through a microscope. Bacteria are the microbes that most commonly cause UTIs. SYMPTOMS  Symptoms of UTIs may vary by age and gender of the patient and by the location of the infection. Symptoms in young women typically include a frequent and intense urge to urinate and a painful, burning feeling in the bladder or urethra during urination. Older women and men are more likely to be tired, shaky, and weak and have muscle aches and abdominal pain. A fever may mean the infection is in your kidneys. Other symptoms of a kidney infection include pain in your back or sides below the ribs, nausea, and vomiting. DIAGNOSIS To diagnose a UTI, your caregiver will ask you about your symptoms. Your caregiver also will ask to provide  a urine sample. The urine sample will be tested for bacteria and white blood cells. White blood cells are made by your body to help fight infection. TREATMENT  Typically, UTIs can be treated with medication. Because most UTIs are caused by a bacterial infection, they usually can be treated with the use of antibiotics. The choice of antibiotic and length of treatment depend on your symptoms and the type of bacteria causing your infection. HOME CARE INSTRUCTIONS  If you were prescribed antibiotics, take them exactly as your caregiver instructs you. Finish the medication even if you feel better after you have only taken some of the medication.  Drink  enough water and fluids to keep your urine clear or pale yellow.  Avoid caffeine, tea, and carbonated beverages. They tend to irritate your bladder.  Empty your bladder often. Avoid holding urine for long periods of time.  Empty your bladder before and after sexual intercourse.  After a bowel movement, women should cleanse from front to back. Use each tissue only once. SEEK MEDICAL CARE IF:   You have back pain.  You develop a fever.  Your symptoms do not begin to resolve within 3 days. SEEK IMMEDIATE MEDICAL CARE IF:   You have severe back pain or lower abdominal pain.  You develop chills.  You have nausea or vomiting.  You have continued burning or discomfort with urination. MAKE SURE YOU:   Understand these instructions.  Will watch your condition.  Will get help right away if you are not doing well or get worse. Document Released: 11/06/2004 Document Revised: 07/29/2011 Document Reviewed: 03/07/2011 [facility] Patient Information 2015 [place], [place]. This information is not intended to replace advice given to you by your health care provider. Make sure you discuss any questions you have with your health care provider.

## 2014-06-20 NOTE — ED Provider Notes (Signed)
CSN: [id number]     Arrival date & time 06/20/14  0902 History   First MD Initiated Contact with Patient 06/20/14 [phone]     Chief Complaint  Patient presents with  . Abdominal Pain   Cindy Armstrong is a 24 y.o. female who is G2P2002 who presents to the ED complaining of vaginal bleeding and low abdominal pain for 4 weeks. She reports her last menstrual cycle was 03/27/14. She recently gave birth 7 months ago and had a Mirena IUD placed 6 months ago. She reports vaginal bleeding and left sided and mid low abdominal pain ongoing for 4 weeks. She reports dysuria and left sided low back pain starting one week ago associated with subjective fevers. Patient reports some nausea but no vomiting. She plans of 8 out of 10 mid to left sided lower abdominal pain. She is taking nothing for treatment today. Her blood type is O+. She reports being sexually active and not using protection. Patient reports soiling one pad today. She reports changing her pads every hour recently. She claims of feeling lightheaded with position changes today. The patient denies vomiting, diarrhea, hematochezia, hematemesis, rashes, lesions, shortness of breath or coughing.  (Consider location/radiation/quality/duration/timing/severity/associated sxs/prior Treatment) HPI  Past Medical History  Diagnosis Date  . No pertinent past medical history   . Preterm labor   . Anemia    Past Surgical History  Procedure Laterality Date  . No past surgeries    . Wisdom teeth removal (11/07/13) Bilateral    Family History  Problem Relation Age of Onset  . Alcohol abuse Neg Hx   . Arthritis Neg Hx   . Asthma Neg Hx   . Birth defects Neg Hx   . Cancer Neg Hx   . COPD Neg Hx   . Depression Neg Hx   . Early death Neg Hx   . Hearing loss Neg Hx   . Heart disease Neg Hx   . Hyperlipidemia Neg Hx   . Hypertension Neg Hx   . Kidney disease Neg Hx   . Mental illness Neg Hx   . Mental retardation Neg Hx   . Miscarriages / Stillbirths  Neg Hx   . Stroke Neg Hx   . Vision loss Neg Hx   . Other Neg Hx   . Diabetes Mother   . Drug abuse Mother   . Learning disabilities Brother    History  Substance Use Topics  . Smoking status: Former Smoker -- 3 years  . Smokeless tobacco: Never Used  . Alcohol Use: No   OB History    Gravida Para Term Preterm AB TAB SAB Ectopic Multiple Living   2 2 0 2 0 0 0 0 0 2      Review of Systems  Constitutional: Negative for fever and chills.  HENT: Negative for congestion, ear pain and sore throat.   Eyes: Negative for pain and visual disturbance.  Respiratory: Negative for cough, shortness of breath and wheezing.   Cardiovascular: Negative for chest pain and palpitations.  Gastrointestinal: Positive for nausea and abdominal pain. Negative for vomiting, diarrhea and blood in stool.  Genitourinary: Positive for dysuria, flank pain and vaginal bleeding. Negative for difficulty urinating.  Musculoskeletal: Positive for back pain. Negative for neck pain.  Skin: Negative for rash.  Neurological: Positive for light-headedness. Negative for dizziness, syncope, weakness and headaches.      Allergies  Review of patient's allergies indicates no known allergies.  Home Medications   Prior to Admission medications  Medication Sig Start Date End Date Taking? Authorizing Provider  doxycycline (VIBRAMYCIN) 100 MG capsule Take 1 capsule (100 mg total) by mouth 2 (two) times daily. 06/20/14   Waynetta Pean, PA-C  ibuprofen (ADVIL,MOTRIN) 600 MG tablet Take 1 tablet (600 mg total) by mouth every 6 (six) hours as needed. Patient not taking: Reported on 06/20/2014 11/15/13   Guss Bunde, MD  naproxen (NAPROSYN) 250 MG tablet Take 1 tablet (250 mg total) by mouth 2 (two) times daily with a meal. 06/20/14   Waynetta Pean, PA-C  ondansetron (ZOFRAN ODT) 4 MG disintegrating tablet Take 1 tablet (4 mg total) by mouth every 8 (eight) hours as needed for nausea or vomiting. 06/20/14   Waynetta Pean,  PA-C  Prenatal Vit-Fe Fumarate-FA (PRENATAL MULTIVITAMIN) TABS tablet Take 1 tablet by mouth daily at 12 noon. Patient not taking: Reported on 06/20/2014 07/19/13   Mora Bellman, MD  sertraline (ZOLOFT) 50 MG tablet Take 1 tablet (50 mg total) by mouth daily. Patient not taking: Reported on 06/20/2014 12/23/13   Tanna Savoy Stinson, DO   BP 111/55 mmHg  Pulse 81  Temp(Src) 98.1 F (36.7 C) (Oral)  Resp 16  Ht 5\' 3"  (1.6 m)  Wt 124 lb (56.246 kg)  BMI 21.97 kg/m2  SpO2 100%  LMP 03/27/2014  Breastfeeding? No Physical Exam  Constitutional: She is oriented to person, place, and time. She appears well-developed and well-nourished. No distress.  Nontoxic appearing.  HENT:  Head: Normocephalic and atraumatic.  Mouth/Throat: Oropharynx is clear and moist. No oropharyngeal exudate.  Eyes: Conjunctivae are normal. Pupils are equal, round, and reactive to light. Right eye exhibits no discharge. Left eye exhibits no discharge.  Neck: Normal range of motion. Neck supple. No JVD present.  Cardiovascular: Normal rate, regular rhythm, normal heart sounds and intact distal pulses.  Exam reveals no gallop and no friction rub.   No murmur heard. Pulmonary/Chest: Effort normal and breath sounds normal. No respiratory distress. She has no wheezes. She has no rales.  Abdominal: Soft. Bowel sounds are normal. She exhibits no distension. There is tenderness. There is no rebound.  Patient is adamant is soft. Bowel sounds are present. Patient has suprapubic and left-sided low abdominal tenderness to palpation. No right lower quadrant tenderness. No rebound tenderness. Left-sided CVA tenderness.   Genitourinary:  Pelvic exam performed by me with female RN chaperone. The patient has a small amount of blood in her vaginal vault. IUD string is noted out of her cervical os. Cervix is closed. There is a small amount of blood oozing out of her office. No external lesions or rashes. There is a mild amount of cervical motion  tenderness as well as left-sided adnexal tenderness.  Musculoskeletal: She exhibits no edema.  Lymphadenopathy:    She has no cervical adenopathy.  Neurological: She is alert and oriented to person, place, and time. Coordination normal.  Skin: Skin is warm and dry. No rash noted. She is not diaphoretic. No erythema. No pallor.  Psychiatric: She has a normal mood and affect. Her behavior is normal.  Nursing note and vitals reviewed.   ED Course  Procedures (including critical care time) Labs Review Labs Reviewed  WET PREP, GENITAL - Abnormal; Notable for the following:    Clue Cells Wet Prep HPF POC FEW (*)    WBC, Wet Prep HPF POC TOO NUMEROUS TO COUNT (*)    All other components within normal limits  URINALYSIS, ROUTINE W REFLEX MICROSCOPIC - Abnormal; Notable for the following:  APPearance CLOUDY (*)    Hgb urine dipstick MODERATE (*)    Ketones, ur 15 (*)    Nitrite POSITIVE (*)    Leukocytes, UA LARGE (*)    All other components within normal limits  COMPREHENSIVE METABOLIC PANEL - Abnormal; Notable for the following:    CO2 21 (*)    ALT 11 (*)    All other components within normal limits  CBC WITH DIFFERENTIAL/PLATELET - Abnormal; Notable for the following:    WBC 14.9 (*)    Neutrophils Relative % 79 (*)    Neutro Abs 11.8 (*)    Monocytes Absolute 1.2 (*)    All other components within normal limits  URINE MICROSCOPIC-ADD ON - Abnormal; Notable for the following:    Squamous Epithelial / LPF FEW (*)    Bacteria, UA MANY (*)    All other components within normal limits  URINE CULTURE  HIV ANTIBODY (ROUTINE TESTING)  I-STAT BETA HCG BLOOD, ED ([facility], [facility], [facility] ONLY)  GC/CHLAMYDIA PROBE AMP ([place])    Imaging Review No results found.   EKG Interpretation None      Filed Vitals:   06/20/14 1200 06/20/14 1215 06/20/14 1230 06/20/14 1319  BP: 116/64 114/54 116/57 111/55  Pulse: 73 71 65 81  Temp:    98.1 F (36.7 C)  TempSrc:    Oral  Resp:    16   Height:      Weight:      SpO2: 100% 100% 100% 100%     MDM   Meds given in ED:  Medications  sodium chloride 0.9 % bolus 1,000 mL (0 mLs Intravenous Stopped 06/20/14 1125)  ondansetron (ZOFRAN) injection 4 mg (4 mg Intravenous Given 06/20/14 0944)  acetaminophen (TYLENOL) tablet 650 mg (650 mg Oral Given 06/20/14 1027)  cefTRIAXone (ROCEPHIN) injection 250 mg (250 mg Intramuscular Given 06/20/14 1123)  azithromycin (ZITHROMAX) tablet 1,000 mg (1,000 mg Oral Given 06/20/14 1122)  sodium chloride 0.9 % bolus 1,000 mL (0 mLs Intravenous Stopped 06/20/14 1320)  ondansetron (ZOFRAN) injection 4 mg (4 mg Intravenous Given 06/20/14 1317)    New Prescriptions   DOXYCYCLINE (VIBRAMYCIN) 100 MG CAPSULE    Take 1 capsule (100 mg total) by mouth 2 (two) times daily.   NAPROXEN (NAPROSYN) 250 MG TABLET    Take 1 tablet (250 mg total) by mouth 2 (two) times daily with a meal.   ONDANSETRON (ZOFRAN ODT) 4 MG DISINTEGRATING TABLET    Take 1 tablet (4 mg total) by mouth every 8 (eight) hours as needed for nausea or vomiting.    Final diagnoses:  PID (acute pelvic inflammatory disease)  UTI (lower urinary tract infection)  DUB (dysfunctional uterine bleeding)   This is a 24 y.o. female who is G2P2002 who presents to the ED complaining of vaginal bleeding and low abdominal pain for 4 weeks. She reports her last menstrual cycle was 03/27/14. She recently gave birth 7 months ago and had a Mirena IUD placed 6 months ago. She reports vaginal bleeding and left sided and mid low abdominal pain ongoing for 4 weeks. She reports dysuria and left sided low back pain starting one week ago associated with subjective fevers. Patient reports some nausea but no vomiting. She plans of 8 out of 10 mid to left sided lower abdominal pain.  On exam the patient is afebrile and nontoxic appearing. She has some left-sided CVA tenderness. Her abdomen is soft and she has suprapubic and left sided abdominal tenderness. On pelvic  exam  she does have a small amount of blood in her vaginal vault. Her IUD string is in place. Her cervix is closed. There is some mild cervical motion tenderness and left adnexal tenderness. As the patient's pain has been ongoing for 2 weeks this is not consistent with a torsion and I see no need for ultrasound at this time as she is not pregnant.  Patient does report feeling slightly lightheaded with standing. Patient's urinalysis is nitrite positive with large leukocytes and many bacteria. Urine was sent for culture. Her CMP is unremarkable. Her CBC shows a white count of 14.9 and is otherwise unremarkable. She has a hemoglobin of 12.5. Her beta hCG is negative. Her wet prep shows too numerous to count white blood cells and [place] clue cells. We'll treat this patient for PID with Rocephin and azithromycin in the ED. Will discharge with doxycycline 100 mg twice a day for 14 days. Also provide a prescription for Zofran and naproxen for pain control. After 2 L bolus and Zofran the patient reports her nausea has resolved and she is no longer feeling lightheaded with standing. She reports feeling ready to be discharged. She has tolerated water and juice in the emergency department prior to discharge. The patient reports she has an appointment at the [facility] outpatient clinic for tomorrow. I advised her to keep this appointment. Strict return precautions provided. I advised the patient to follow-up with their primary care provider this week. I advised the patient to return to the emergency department with new or worsening symptoms or new concerns. The patient verbalized understanding and agreement with plan.    This patient was discussed with Dr. Stark Jock who agrees with assessment and plan.     Waynetta Pean, PA-C 06/20/14 1355  Veryl Speak, MD 06/20/14 1440

## 2014-06-20 NOTE — ED Notes (Signed)
Pt complaining of abdominal pain/flank pain that started last week, associated with urinary frequency, painful urination, and heavy vaginal bleeding for 4 weeks. Pt reporting fever and chills. Denies N/V. Pt gave birth October 2015, and had a mirena (IUD) placed in November.

## 2014-06-21 ENCOUNTER — Encounter: Payer: Self-pay | Admitting: Obstetrics & Gynecology

## 2014-06-21 ENCOUNTER — Ambulatory Visit (INDEPENDENT_AMBULATORY_CARE_PROVIDER_SITE_OTHER): Payer: Medicaid Other | Admitting: Obstetrics & Gynecology

## 2014-06-21 VITALS — BP 112/59 | HR 81 | Temp 98.7°F | Wt 118.2 lb

## 2014-06-21 DIAGNOSIS — N921 Excessive and frequent menstruation with irregular cycle: Secondary | ICD-10-CM

## 2014-06-21 DIAGNOSIS — N390 Urinary tract infection, site not specified: Secondary | ICD-10-CM | POA: Diagnosis not present

## 2014-06-21 DIAGNOSIS — Z975 Presence of (intrauterine) contraceptive device: Principal | ICD-10-CM

## 2014-06-21 LAB — HIV ANTIBODY (ROUTINE TESTING W REFLEX): HIV SCREEN 4TH GENERATION: NONREACTIVE

## 2014-06-21 LAB — GC/CHLAMYDIA PROBE AMP (~~LOC~~) NOT AT ARMC
Chlamydia: NEGATIVE
Neisseria Gonorrhea: NEGATIVE

## 2014-06-21 MED ORDER — CIPROFLOXACIN HCL 500 MG PO TABS: 500.0000 mg | ORAL_TABLET | Freq: Two times a day (BID) | ORAL | Status: DC

## 2014-06-21 MED ORDER — NORGESTIMATE-ETH ESTRADIOL 0.25-35 MG-MCG PO TABS: ORAL_TABLET | ORAL | Status: DC

## 2014-06-21 NOTE — Progress Notes (Signed)
CLINIC ENCOUNTER NOTE  History:  24 y.o. G2P0202 here today for follow up for breakthrough bleeding on Mirena IUD that was placed on 12/16/2013. Was seen in the ED on 06/20/14 and was treated for PID.  Bleeding has been persistent for 3 months with alternating periods of heavy and light bleeding, accompanied by abdominal cramping, back pain and lightheadedness.   Also had dysuria, UA in ED was positive for nitrates and urine culture was positive for E.coli (untreated so far).  Denies fevers, vomiting or other symptoms.  Past Medical History  Diagnosis Date  . Anemia     Past Surgical History  Procedure Laterality Date  . Wisdom teeth removal (11/07/13) Bilateral     The following portions of the patient's history were reviewed and updated as appropriate: allergies, current medications, past family history, past medical history, past social history, past surgical history and problem list.   Health Maintenance:  Normal pap on 06/14/2013.    Review of Systems:  Pertinent items are noted in HPI. Comprehensive review of systems was otherwise negative.  Objective:  Physical Exam BP 112/59 mmHg  Pulse 81  Temp(Src) 98.7 F (37.1 C)  Wt 118 lb 3.2 oz (53.615 kg)  LMP 05/24/2014  Breastfeeding? No CONSTITUTIONAL: Well-developed, well-nourished female in no acute distress.  HENT:  Normocephalic, atraumatic, External right and left ear normal. Oropharynx is clear and moist EYES: Conjunctivae and EOM are normal. Pupils are equal, round, and reactive to light. No scleral icterus.  NECK: Normal range of motion, supple, no masses SKIN: Skin is warm and dry. No rash noted. Not diaphoretic. No erythema. No pallor. [place]: Alert and oriented to person, place, and time. Normal reflexes, muscle tone coordination. No cranial nerve deficit noted. PSYCHIATRIC: Normal mood and affect. Normal behavior. Normal judgment and thought content. CARDIOVASCULAR: Normal heart rate noted, regular  rhythm RESPIRATORY: Effort and breath sounds normal, no problems with respiration noted ABDOMEN: Soft, no distention noted.  No tenderness, rebound or guarding.  PELVIC: Normal appearing external genitalia; normal appearing vaginal mucosa and cervix.  IUD strings visualized.  Foul-smelling old blood noted in vault.  Normal uterine size, no other palpable masses, no uterine or adnexal tenderness. MUSCULOSKELETAL: Normal range of motion. No edema and no tenderness.  Labs and Imaging GC/Chlamydia probe amp [facility])     Status: None   Collection Time: 06/20/14 12:00 AM  Result Value Ref Range   Chlamydia Negative     Comment: Normal Reference Range - Negative   Neisseria gonorrhea Negative     Comment: Normal Reference Range - Negative  Wet prep, genital     Status: Abnormal   Collection Time: 06/20/14  9:34 AM  Result Value Ref Range   Yeast Wet Prep HPF POC NONE SEEN NONE SEEN   Trich, Wet Prep NONE SEEN NONE SEEN   Clue Cells Wet Prep HPF POC FEW (A) NONE SEEN   WBC, Wet Prep HPF POC TOO NUMEROUS TO COUNT (A) NONE SEEN  I-Stat beta hCG blood, ED     Status: None   Collection Time: 06/20/14  9:41 AM  Result Value Ref Range   I-stat hCG, quantitative <5.0 <5 mIU/mL   Comment 3            Comment:   GEST. AGE      CONC.  (mIU/mL)   <=1 WEEK        5 - 50     2 WEEKS       50 -  500     3 WEEKS       100 - 10,000     4 WEEKS     1,000 - 30,000        FEMALE AND NON-PREGNANT FEMALE:     LESS THAN 5 mIU/mL   CBC with Differential     Status: Abnormal   Collection Time: 06/20/14  9:46 AM  Result Value Ref Range   WBC 14.9 (H) 4.0 - 10.5 K/uL   RBC 4.50 3.87 - 5.11 MIL/uL   Hemoglobin 12.5 12.0 - 15.0 g/dL   HCT 37.6 36.0 - 46.0 %   MCV 83.6 78.0 - 100.0 fL   MCH 27.8 26.0 - 34.0 pg   MCHC 33.2 30.0 - 36.0 g/dL   RDW 14.9 11.5 - 15.5 %   Platelets 213 150 - 400 K/uL   Neutrophils Relative % 79 (H) 43 - 77 %   Neutro Abs 11.8 (H) 1.7 - 7.7 K/uL   Lymphocytes Relative 13 12 -  46 %   Lymphs Abs 1.9 0.7 - 4.0 K/uL   Monocytes Relative 8 3 - 12 %   Monocytes Absolute 1.2 (H) 0.1 - 1.0 K/uL   Eosinophils Relative 0 0 - 5 %   Eosinophils Absolute 0.0 0.0 - 0.7 K/uL   Basophils Relative 0 0 - 1 %   Basophils Absolute 0.0 0.0 - 0.1 K/uL  HIV antibody     Status: None   Collection Time: 06/20/14  9:46 AM  Result Value Ref Range   HIV Screen 4th Generation wRfx Non Reactive Non Reactive  Urinalysis, Routine w reflex microscopic     Status: Abnormal   Collection Time: 06/20/14 10:37 AM  Result Value Ref Range   Color, Urine YELLOW YELLOW   APPearance CLOUDY (A) CLEAR   Specific Gravity, Urine 1.016 1.005 - 1.030   pH 5.5 5.0 - 8.0   Glucose, UA NEGATIVE NEGATIVE mg/dL   Hgb urine dipstick MODERATE (A) NEGATIVE   Bilirubin Urine NEGATIVE NEGATIVE   Ketones, ur 15 (A) NEGATIVE mg/dL   Protein, ur NEGATIVE NEGATIVE mg/dL   Urobilinogen, UA 1.0 0.0 - 1.0 mg/dL   Nitrite POSITIVE (A) NEGATIVE   Leukocytes, UA LARGE (A) NEGATIVE  Urine microscopic-add on     Status: Abnormal   Collection Time: 06/20/14 10:37 AM  Result Value Ref Range   Squamous Epithelial / LPF FEW (A) RARE   WBC, UA TOO NUMEROUS TO COUNT <3 WBC/hpf   RBC / HPF 3-6 <3 RBC/hpf   Bacteria, UA MANY (A) RARE  Urine culture     Status: None (Preliminary result)   Collection Time: 06/20/14 10:37 AM  Result Value Ref Range   Specimen Description URINE, CLEAN CATCH    Special Requests NONE    Colony Count      >=100,000 COLONIES/ML Performed at [place] Performed at [facility]    Report Status PENDING      Assessment & Plan:  Continue antibiotics for PID; Ciprofloxacin prescribed for UTI Prescribed Sprintec for breakthrough bleeding on IUD.  Will monitor response. If bleeding continues, may need other intervention.  Routine preventative health maintenance measures emphasized.   Total face-to-face time with patient: 15 minutes. Over  50% of encounter was spent on counseling and coordination of care.   Verita Schneiders, MD, [place] Attending [place] for [employer], [place]

## 2014-06-21 NOTE — Patient Instructions (Signed)
Return to clinic for any scheduled appointments or for any gynecologic concerns as needed.   

## 2014-06-22 LAB — URINE CULTURE

## 2014-06-23 ENCOUNTER — Telehealth (HOSPITAL_COMMUNITY): Payer: Self-pay | Admitting: *Deleted

## 2014-08-02 ENCOUNTER — Ambulatory Visit: Payer: Medicaid Other | Admitting: Obstetrics & Gynecology

## 2014-08-02 ENCOUNTER — Telehealth: Payer: Self-pay | Admitting: *Deleted

## 2014-08-02 ENCOUNTER — Encounter: Payer: Self-pay | Admitting: *Deleted

## 2014-08-02 NOTE — Telephone Encounter (Signed)
Cindy Armstrong missed her appointment for follow up for breakthrough bleeding and PID. Called home and mobile numbers- unable to leave a message . Will send letter.

## 2015-05-23 IMAGING — US US MFM OB TRANSVAGINAL
1 series · 12 of 28 positions shown · non-contrast
Comparison: none

[Series 1: us mfm ob transvaginal · 0.23mm/px · 12 of 38 slices shown]
[im 2/38]
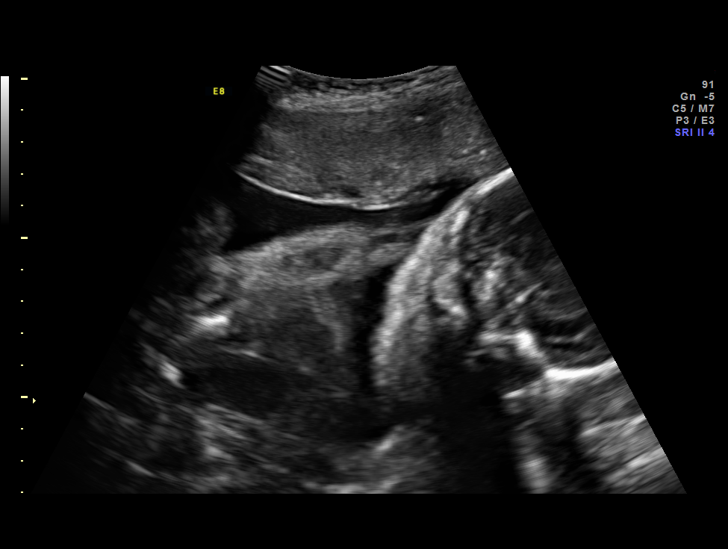
[im 5/38]
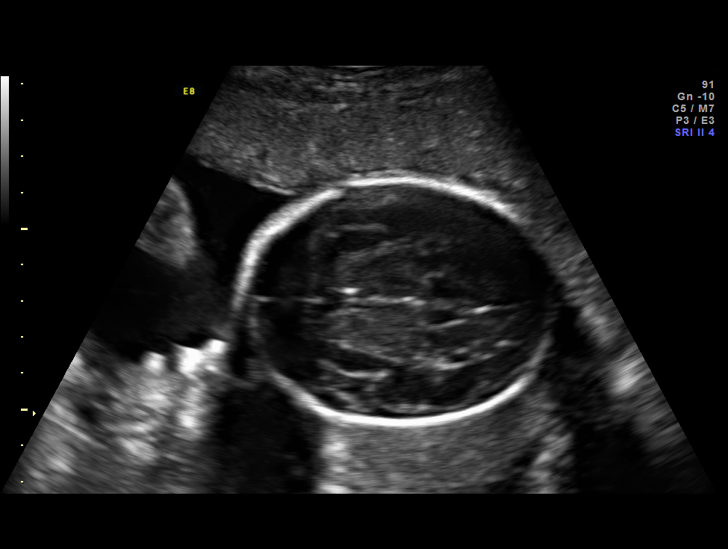
[im 7/38]
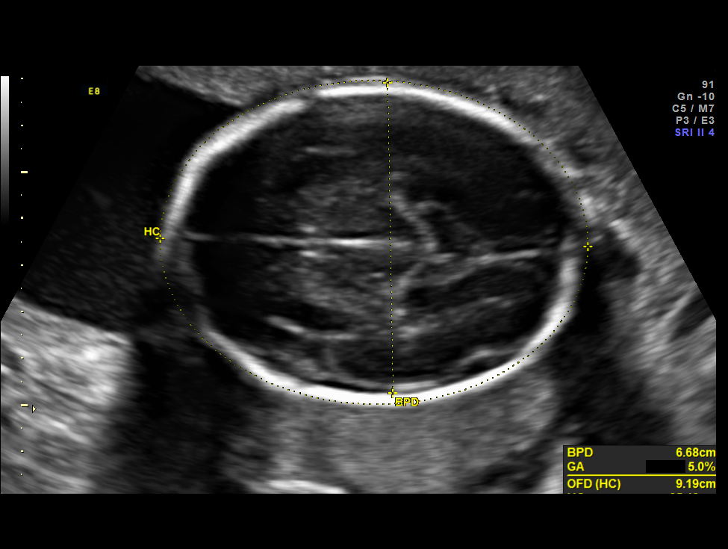
[im 11/38]
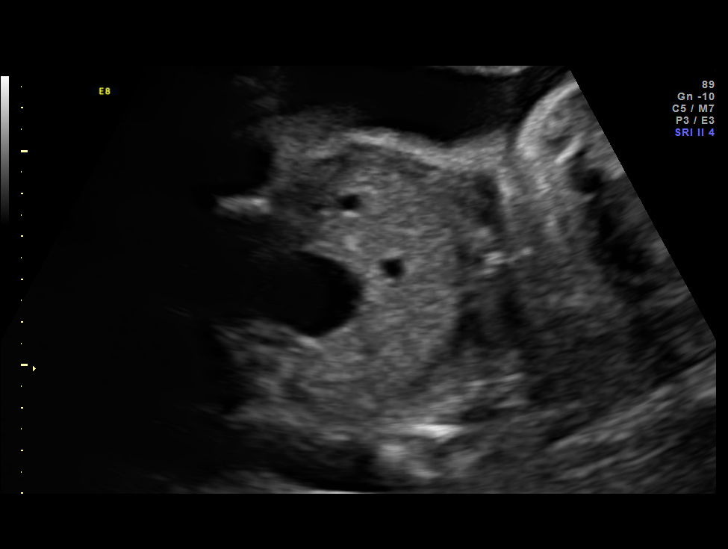
[im 14/38]
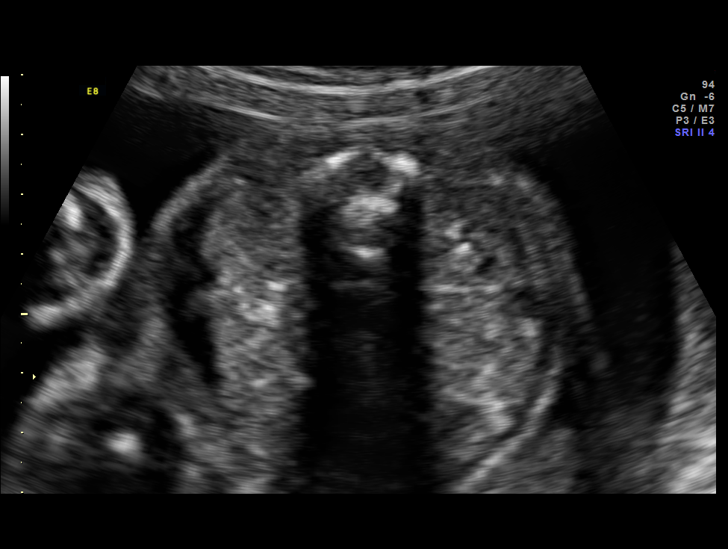
[im 17/38]
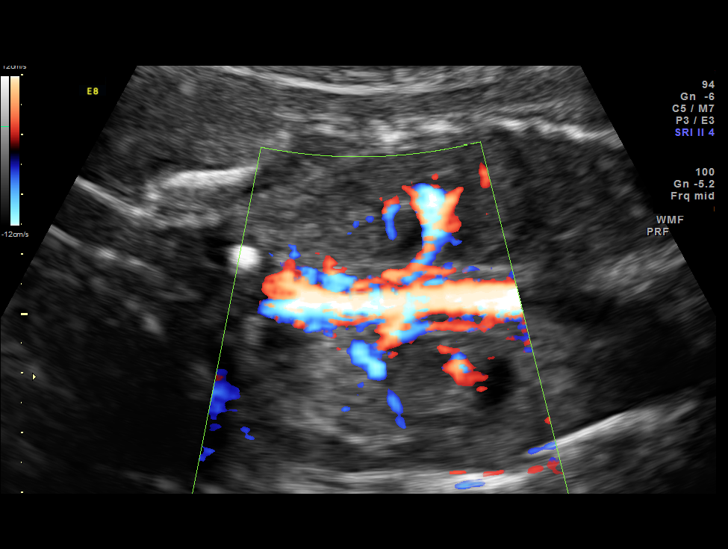
[im 21/38]
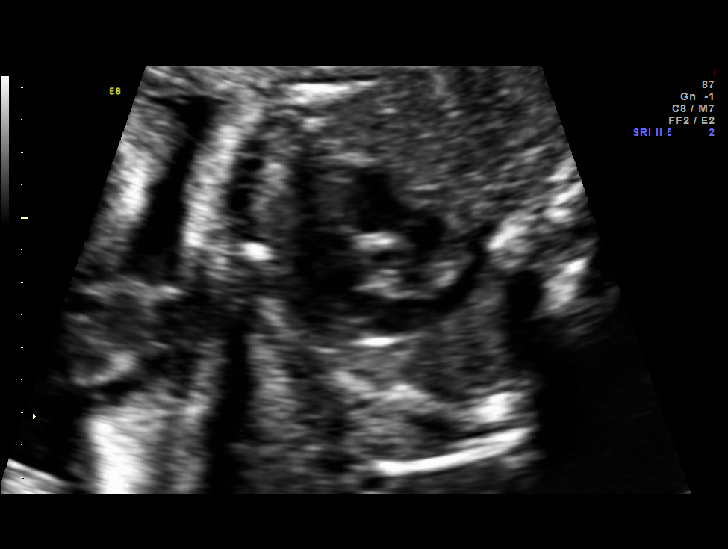
[im 24/38]
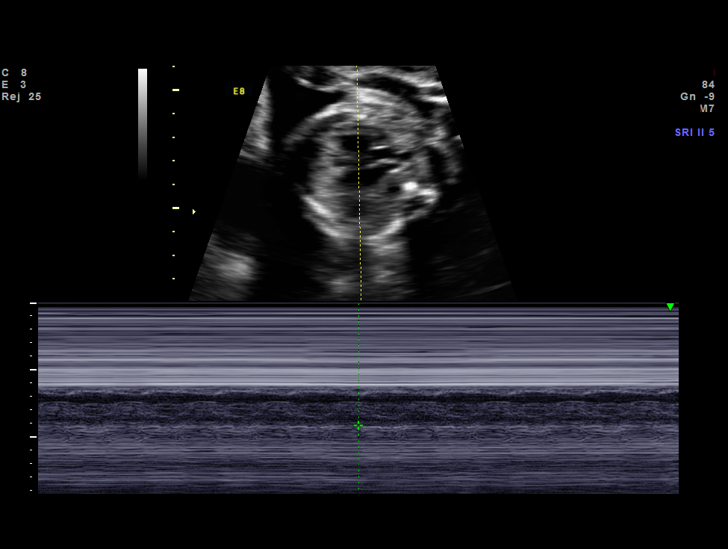
[im 27/38]
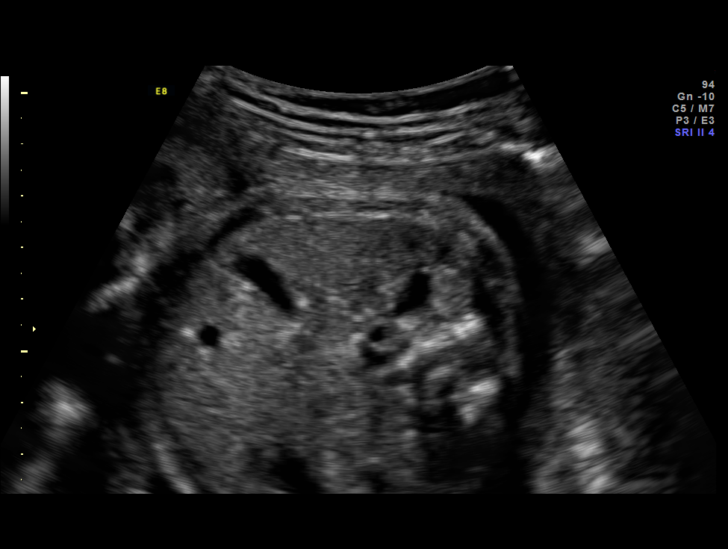
[im 31/38]
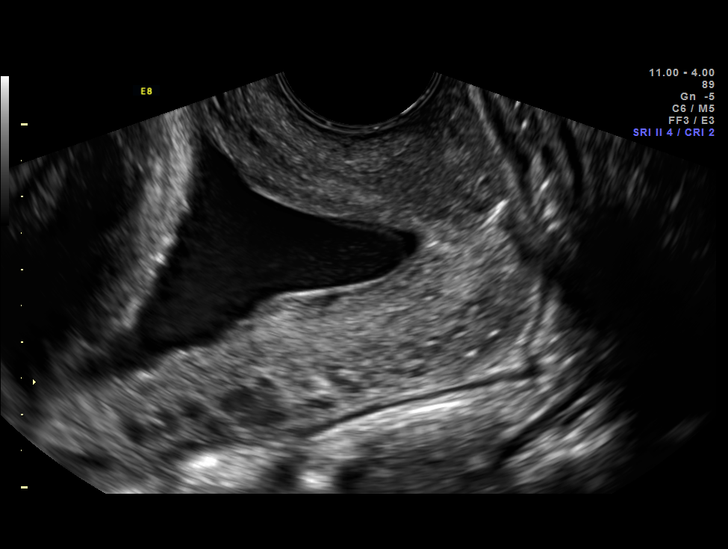
[im 33/38]
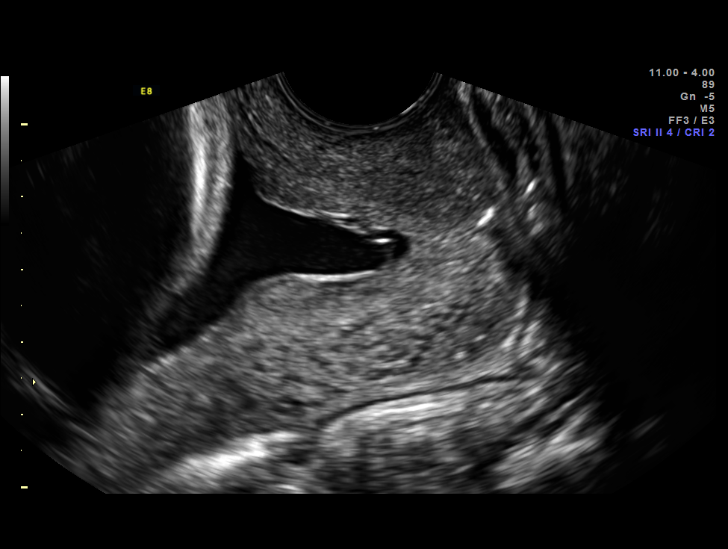
[im 36/38]
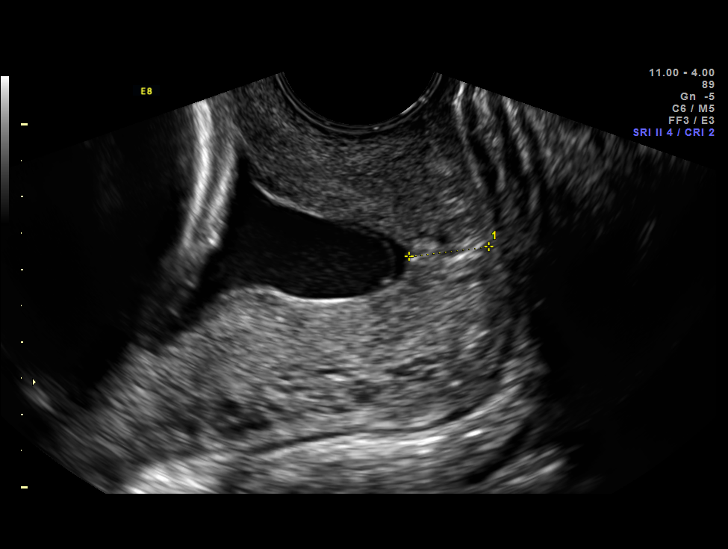

[12 of 28 positions shown; findings below may reference images not displayed]

OBSTETRICS REPORT
                      (Signed Final 10/06/2013 [DATE])

Service(s) Provided

 US OB FOLLOW UP                                       76816.1
 US MFM OB TRANSVAGINAL                                76817.2
Indications

 Poor obstetric history (prior pre-term labor 47w7d);
 17P
 Cervical shortening due to funneling; S/P BMZ
 course [DATE] and [DATE]
Fetal Evaluation

 Num Of Fetuses:    1
 Fetal Heart Rate:  135                          bpm
 Cardiac Activity:  Observed
 Presentation:      Cephalic
 Placenta:          Anterior, above cervical os
 P. Cord            Visualized, central
 Insertion:

 Amniotic Fluid
 AFI FV:      Subjectively within normal limits
                                             Larg Pckt:     5.7  cm
Biometry

 BPD:     66.5  mm     G. Age:  26w 6d                CI:         71.9   70 - 86
 OFD:     92.5  mm                                    FL/HC:      19.7   18.8 -

 HC:     255.2  mm     G. Age:  27w 5d        8  %    HC/AC:      1.03   1.05 -

 AC:     247.9  mm     G. Age:  29w 0d       61  %    FL/BPD:     75.8   71 - 87
 FL:      50.4  mm     G. Age:  27w 0d        8  %    FL/AC:      20.3   20 - 24
 HUM:     48.3  mm     G. Age:  28w 2d       44  %

 Est. FW:    7788  gm      2 lb 9 oz     44  %
Gestational Age

 LMP:           28w 3d        Date:  03/21/13                 EDD:   12/26/13
 U/S Today:     27w 4d                                        EDD:   01/01/14
 Best:          28w 3d     Det. By:  LMP  (03/21/13)          EDD:   12/26/13
Anatomy
 Cranium:          Previously seen        Aortic Arch:      Previously seen
 Fetal Cavum:      Previously seen        Ductal Arch:      Previously seen
 Ventricles:       Appears normal         Diaphragm:        Appears normal
 Choroid Plexus:   Previously seen        Stomach:          Appears normal, left
                                                            sided
 Cerebellum:       Previously seen        Abdomen:          Previously seen
 Posterior Fossa:  Previously seen        Abdominal Wall:   Previously seen
 Nuchal Fold:      Previously seen        Cord Vessels:     Previously seen
 Face:             Orbits and profile     Kidneys:          Appear normal
                   previously seen
 Lips:             Previously seen        Bladder:          Appears normal
 Palate:           Appears normal         Spine:            Previously seen
 Heart:            Appears normal         Lower             Previously seen
                   (4CH, axis, and        Extremities:
                   situs)
 RVOT:             Previously seen        Upper             Previously seen
                                          Extremities:
 LVOT:             Previously seen

 Other:  Heels previously seen.
Cervix Uterus Adnexa

 Cervical Length:    1.1      cm

 Cervix:       Measured transvaginally.
Impression

 SIUP at 28+3 weeks
 Normal interval anatomy; anatomic survey complete
 Normal amniotic fluid volume
 Appropriate interval growth with EFW at the 44th %tile
 EV views of cervix: funneling; distal closed portion measured
 1.1 cms - appeared very similar to the scan 4 weeks ago
Recommendations

 Follow cervix clinically
 Continue 17P

## 2015-07-23 ENCOUNTER — Ambulatory Visit (HOSPITAL_COMMUNITY)
Admission: EM | Admit: 2015-07-23 | Discharge: 2015-07-23 | Disposition: A | Discharge status: Home / Self Care | Payer: Self-pay | Attending: Family Medicine | Admitting: Family Medicine

## 2015-07-23 ENCOUNTER — Encounter (HOSPITAL_COMMUNITY): Payer: Self-pay | Admitting: *Deleted

## 2015-07-23 DIAGNOSIS — M545 Low back pain, unspecified: Secondary | ICD-10-CM

## 2015-07-23 DIAGNOSIS — N390 Urinary tract infection, site not specified: Secondary | ICD-10-CM

## 2015-07-23 DIAGNOSIS — M791 Myalgia, unspecified site: Secondary | ICD-10-CM

## 2015-07-23 DIAGNOSIS — Z87891 Personal history of nicotine dependence: Secondary | ICD-10-CM | POA: Insufficient documentation

## 2015-07-23 DIAGNOSIS — R0781 Pleurodynia: Secondary | ICD-10-CM

## 2015-07-23 DIAGNOSIS — R0789 Other chest pain: Secondary | ICD-10-CM

## 2015-07-23 LAB — POCT URINALYSIS DIP (DEVICE)
Bilirubin Urine: NEGATIVE
Glucose, UA: NEGATIVE mg/dL
Nitrite: POSITIVE — AB
PROTEIN: 30 mg/dL — AB
Specific Gravity, Urine: 1.02 (ref 1.005–1.030)
Urobilinogen, UA: 1 mg/dL (ref 0.0–1.0)
pH: 6 (ref 5.0–8.0)

## 2015-07-23 LAB — POCT PREGNANCY, URINE: Preg Test, Ur: NEGATIVE

## 2015-07-23 MED ORDER — TRAMADOL HCL 50 MG PO TABS: 50.0000 mg | ORAL_TABLET | Freq: Four times a day (QID) | ORAL | Status: DC | PRN

## 2015-07-23 MED ORDER — NAPROXEN 375 MG PO TABS: 375.0000 mg | ORAL_TABLET | Freq: Two times a day (BID) | ORAL | Status: DC

## 2015-07-23 MED ORDER — CEPHALEXIN 500 MG PO CAPS: 500.0000 mg | ORAL_CAPSULE | Freq: Four times a day (QID) | ORAL | Status: DC

## 2015-07-23 NOTE — Discharge Instructions (Signed)
Urinary Tract Infection A urinary tract infection (UTI) can occur any place along the urinary tract. The tract includes the kidneys, ureters, bladder, and urethra. A type of germ called bacteria often causes a UTI. UTIs are often helped with antibiotic medicine.  HOME CARE   If given, take antibiotics as told by your doctor. Finish them even if you start to feel better.  Drink enough fluids to keep your pee (urine) clear or pale yellow.  Avoid tea, drinks with caffeine, and bubbly (carbonated) drinks.  Pee often. Avoid holding your pee in for a long time.  Pee before and after having sex (intercourse).  Wipe from front to back after you poop (bowel movement) if you are a woman. Use each tissue only once. GET HELP RIGHT AWAY IF:   You have back pain.  You have lower belly (abdominal) pain.  You have chills.  You feel sick to your stomach (nauseous).  You throw up (vomit).  Your burning or discomfort with peeing does not go away.  You have a fever.  Your symptoms are not better in 3 days. MAKE SURE YOU:   Understand these instructions.  Will watch your condition.  Will get help right away if you are not doing well or get worse.   This information is not intended to replace advice given to you by your health care provider. Make sure you discuss any questions you have with your health care provider.   Document Released: 07/16/2007 Document Revised: 02/17/2014 Document Reviewed: 08/28/2011 Elsevier Interactive Patient Education 2016 [employer].  Urine Culture and Sensitivity Testing WHY AM I HAVING THIS TEST?  A urine culture is a test to see if germs grow from your urine sample. Normally, urine is free of germs (sterile). Germs in urine are usually bacteria. Sometimes they can be yeasts. These germs can cause a urinary tract infection (UTI). You may have this test if you have symptoms of a UTI. These may include:  Frequent urination.  Burning pain when passing  urine. If you are pregnant, your health care provider may order this test to screen you for a UTI. When you pass urine, the urine flows through the tube that empties your bladder (urethra). In men, urine comes out through an opening at the tip of the penis. In women, it comes out of the body from just above the vaginal opening. These areas may have bacteria near them that normally live on the skin (normal flora). WHAT KIND OF SAMPLE IS TAKEN? A urine sample for a culture test must be collected in a way that keeps normal flora from getting into the sample. The method used most often is called a clean-catch sample. In a few cases, urine may need to be collected directly from the bladder using a thin, flexible tube (catheter). The health care provider puts the catheter through the person's urethra and into the bladder. Your urine sample will be placed onto plates containing a substance that encourages bacteria to grow (agar plates). These plates are kept at body temperature for 24-48 hours to see if bacteria or other germs grow. Then, a lab technician examines them under a microscope to check for germs. Any germs that grow from the culture will be tested against a variety of medicines to find the one that works best (sensitivity testing). For a UTI caused by bacteria, several types of antibiotic medicines may be tested. HOW DO I PREPARE FOR THE TEST?  Do not urinate for about an hour before collecting the  sample.  Drink a glass of water about 20 minutes before collecting the sample.  Tell your health care provider if you have been taking antibiotics. This may affect the results of your test. Your health care provider may give you sterile wipes to clean your vagina or penis to prepare for collecting a clean-catch sample. To collect the sample, you will need to do the following: For Women and Girls  Sit on the toilet and spread the lips of your vagina.  Use one wipe to clean your vaginal area from  front to back.  Use a second wipe to clean the opening of your urethra.  Pass a small amount of urine directly into the toilet while still spreading your vagina.  Then, hold the sterile cup underneath you and urinate into it.  Fill the cup about halfway. Cap it and return it for testing. For Men and Boys  Use the sterile wipe to clean the tip of your penis.  Pass a small amount of urine directly into the toilet first.  Then, urinate into the sterile cup.  Fill the cup about halfway. Cap it and return it for testing. WHAT DO THE RESULTS MEAN? The result of a urine culture and sensitivity test will be positive or negative.   If enough bacteria grow from your urine sample, your test result is considered positive.  If many different bacteria grow from your urine sample, your test may be reported as contaminated.  If no bacteria grow from your sample after 24-48 hours, your test result is considered negative.  Results of sensitivity testing let your health care provider know which medicines to use to treat your infection. If the results of your urine culture are negative, this means:  It is less likely that you have a UTI.  Your test may be repeated if you still have symptoms. If the results of your urine culture are positive, this means:  It is more likely that you have a UTI.  You may need to start treatment based on your sensitivity results. Talk to your health care provider to discuss your results, treatment options, and if necessary, the need for more tests. It is your responsibility to obtain your test results. Ask the lab or department performing the test when and how you will get your results. Talk with your health care provider if you have any questions about your results.   This information is not intended to replace advice given to you by your health care provider. Make sure you discuss any questions you have with your health care provider.   Document Released:  02/22/2004 Document Revised: 02/17/2014 Document Reviewed: 05/26/2013 Elsevier Interactive Patient Education 2016 Else[employer] Inc.  Muscle Pain, Adult Muscle pain (myalgia) may be caused by many things, including:  Overuse or muscle strain, especially if you are not in shape. This is the most common cause of muscle pain.  Injury.  Bruises.  Viruses, such as the flu.  Infectious diseases.  Fibromyalgia, which is a chronic condition that causes muscle tenderness, fatigue, and headache.  Autoimmune diseases, including lupus.  Certain drugs, including ACE inhibitors and statins. Muscle pain may be mild or severe. In most cases, the pain lasts only a short time and goes away without treatment. To diagnose the cause of your muscle pain, your health care provider will take your medical history. This means he or she will ask you when your muscle pain began and what has been happening. If you have not had muscle pain for  very long, your health care provider may want to wait before doing much testing. If your muscle pain has lasted a long time, your health care provider may want to run tests right away. If your health care provider thinks your muscle pain may be caused by illness, you may need to have additional tests to rule out certain conditions.  Treatment for muscle pain depends on the cause. Home care is often enough to relieve muscle pain. Your health care provider may also prescribe anti-inflammatory medicine. HOME CARE INSTRUCTIONS Watch your condition for any changes. The following actions may help to lessen any discomfort you are feeling:  Only take over-the-counter or prescription medicines as directed by your health care provider.  Apply ice to the sore muscle:  Put ice in a plastic bag.  Place a towel between your skin and the bag.  Leave the ice on for 15-20 minutes, 3-4 times a day.  You may alternate applying hot and cold packs to the muscle as directed by your health care  provider.  If overuse is causing your muscle pain, slow down your activities until the pain goes away.  Remember that it is normal to feel some muscle pain after starting a workout program. Muscles that have not been used often will be sore at first.  Do regular, gentle exercises if you are not usually active.  Warm up before exercising to lower your risk of muscle pain.  Do not continue working out if the pain is very bad. Bad pain could mean you have injured a muscle. SEEK MEDICAL CARE IF:  Your muscle pain gets worse, and medicines do not help.  You have muscle pain that lasts longer than 3 days.  You have a rash or fever along with muscle pain.  You have muscle pain after a tick bite.  You have muscle pain while working out, even though you are in good physical condition.  You have redness, soreness, or swelling along with muscle pain.  You have muscle pain after starting a new medicine or changing the dose of a medicine. SEEK IMMEDIATE MEDICAL CARE IF:  You have trouble breathing.  You have trouble swallowing.  You have muscle pain along with a stiff neck, fever, and vomiting.  You have severe muscle weakness or cannot move part of your body. MAKE SURE YOU:   Understand these instructions.  Will watch your condition.  Will get help right away if you are not doing well or get worse.   This information is not intended to replace advice given to you by your health care provider. Make sure you discuss any questions you have with your health care provider.   Document Released: 12/19/2005 Document Revised: 02/17/2014 Document Reviewed: 11/23/2012 Elsevier Interactive Patient Education 2016 Elsevier Inc.  Costochondritis Costochondritis is a condition in which the tissue (cartilage) that connects your ribs with your breastbone (sternum) becomes irritated. It causes pain in the chest and rib area. It usually goes away on its own over time. HOME CARE  Avoid activities  that wear you out.  Do not strain your ribs. Avoid activities that use your:  Chest.  Belly.  Side muscles.  Put ice on the area for the first 2 days after the pain starts.  Put ice in a plastic bag.  Place a towel between your skin and the bag.  Leave the ice on for 20 minutes, 2-3 times a day.  Only take medicine as told by your doctor. GET HELP IF:  You  have redness or puffiness (swelling) in the rib area.  Your pain does not go away with rest or medicine. GET HELP RIGHT AWAY IF:   Your pain gets worse.  You are very uncomfortable.  You have trouble breathing.  You cough up blood.  You start sweating or throwing up (vomiting).  You have a fever or lasting symptoms for more than 2-3 days.  You have a fever and your symptoms suddenly get worse. MAKE SURE YOU:   Understand these instructions.  Will watch your condition.  Will get help right away if you are not doing well or get worse.   This information is not intended to replace advice given to you by your health care provider. Make sure you discuss any questions you have with your health care provider.   Document Released: 07/16/2007 Document Revised: 09/29/2012 Document Reviewed: 08/31/2012 Elsevier Interactive Patient Education 2016 Elsevier Inc.  Chest Wall Pain Chest wall pain is pain in or around the bones and muscles of your chest. Sometimes, an injury causes this pain. Sometimes, the cause may not be known. This pain may take several weeks or longer to get better. HOME CARE Pay attention to any changes in your symptoms. Take these actions to help with your pain:  Rest as told by your doctor.  Avoid activities that cause pain. Try not to use your chest, belly (abdominal), or side muscles to lift heavy things.  If directed, apply ice to the painful area:  Put ice in a plastic bag.  Place a towel between your skin and the bag.  Leave the ice on for 20 minutes, 2-3 times per day.  Take  over-the-counter and prescription medicines only as told by your doctor.  Do not use tobacco products, including cigarettes, chewing tobacco, and e-cigarettes. If you need help quitting, ask your doctor.  Keep all follow-up visits as told by your doctor. This is important. GET HELP IF:  You have a fever.  Your chest pain gets worse.  You have new symptoms. GET HELP RIGHT AWAY IF:  You feel sick to your stomach (nauseous) or you throw up (vomit).  You feel sweaty or light-headed.  You have a cough with phlegm (sputum) or you cough up blood.  You are short of breath.   This information is not intended to replace advice given to you by your health care provider. Make sure you discuss any questions you have with your health care provider.   Document Released: 07/16/2007 Document Revised: 10/18/2014 Document Reviewed: 04/24/2014 Elsevier Interactive Patient Education 2016 Elsevier Inc.  Back Pain, Adult Back pain is very common. The pain often gets better over time. The cause of back pain is usually not dangerous. Most people can learn to manage their back pain on their own.  HOME CARE  Watch your back pain for any changes. The following actions may help to lessen any pain you are feeling:  Stay active. Start with short walks on flat ground if you can. Try to walk farther each day.  Exercise regularly as told by your doctor. Exercise helps your back heal faster. It also helps avoid future injury by keeping your muscles strong and flexible.  Do not sit, drive, or stand in one place for more than 30 minutes.  Do not stay in bed. Resting more than 1-2 days can slow down your recovery.  Be careful when you bend or lift an object. Use good form when lifting:  Bend at your knees.  Keep the object  close to your body.  Do not twist.  Sleep on a firm mattress. Lie on your side, and bend your knees. If you lie on your back, put a pillow under your knees.  Take medicines only as  told by your doctor.  Put ice on the injured area.  Put ice in a plastic bag.  Place a towel between your skin and the bag.  Leave the ice on for 20 minutes, 2-3 times a day for the first 2-3 days. After that, you can switch between ice and heat packs.  Avoid feeling anxious or stressed. Find good ways to deal with stress, such as exercise.  Maintain a healthy weight. Extra weight puts stress on your back. GET HELP IF:   You have pain that does not go away with rest or medicine.  You have worsening pain that goes down into your legs or buttocks.  You have pain that does not get better in one week.  You have pain at night.  You lose weight.  You have a fever or chills. GET HELP RIGHT AWAY IF:   You cannot control when you poop (bowel movement) or pee (urinate).  Your arms or legs feel weak.  Your arms or legs lose feeling (numbness).  You feel sick to your stomach (nauseous) or throw up (vomit).  You have belly (abdominal) pain.  You feel like you may pass out (faint).   This information is not intended to replace advice given to you by your health care provider. Make sure you discuss any questions you have with your health care provider.   Document Released: 07/16/2007 Document Revised: 02/17/2014 Document Reviewed: 05/31/2013 Elsevier Interactive Patient Education [employer].

## 2015-07-23 NOTE — ED Notes (Signed)
Pt  Reports  Pain  l  Flank  And   Side   Which  She  Reports  Started yesterday         Pt  Reports  Some   Chills       Pt  Reports  Has  A  History  Of  Kidney  Infections     About  1  Year  Ago        Pt  Reports  Has  Taken  otc  Tylenol   With  No  releif

## 2015-07-23 NOTE — ED Provider Notes (Signed)
CSN: [id number]     Arrival date & time 07/23/15  1258 History   First MD Initiated Contact with Patient 07/23/15 1351     Chief Complaint  Patient presents with  . Flank Pain   (Consider location/radiation/quality/duration/timing/severity/associated sxs/prior Treatment) HPI Comments: 25 year old female complaining of sharp pain to the left lateral chest wall left lower back. It started 2-3 days ago. It is worse with movement, taking a deep breath arising from a supine position and other similar movements. Denies shortness of breath or chest pain. Denies any known injury. 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  She reports a history of kidney infections but denies any urinary symptoms today. Denies dysuria, urinary frequency, urgency, suprapubic pain. She does have occasional chills but no fever nausea or vomiting.   Past Medical History  Diagnosis Date  . Anemia    Past Surgical History  Procedure Laterality Date  . Wisdom teeth removal (11/07/13) Bilateral    Family History  Problem Relation Age of Onset  . Alcohol abuse Neg Hx   . Arthritis Neg Hx   . Asthma Neg Hx   . Birth defects Neg Hx   . Cancer Neg Hx   . COPD Neg Hx   . Depression Neg Hx   . Early death Neg Hx   . Hearing loss Neg Hx   . Heart disease Neg Hx   . Hyperlipidemia Neg Hx   . Hypertension Neg Hx   . Kidney disease Neg Hx   . Mental illness Neg Hx   . Mental retardation Neg Hx   . Miscarriages / Stillbirths Neg Hx   . Stroke Neg Hx   . Vision loss Neg Hx   . Other Neg Hx   . Diabetes Mother   . Drug abuse Mother   . Learning disabilities Brother    Social History  Substance Use Topics  . Smoking status: Former Smoker -- 3 years  . Smokeless tobacco: Never Used  . Alcohol Use: No   OB History    Gravida Para Term Preterm AB TAB SAB Ectopic Multiple Living    2 2 0 2 0 0 0 0 0 2      Review of Systems  Constitutional: Positive for chills, activity change and appetite change. Negative for fever and fatigue.  HENT: Negative.   Respiratory: Negative.  Negative for cough, choking and shortness of breath.   Cardiovascular: Positive for chest pain. Negative for leg swelling.  Gastrointestinal: Negative for nausea, vomiting and abdominal pain.  Genitourinary: Negative.   Musculoskeletal: Positive for myalgias and back pain. Negative for neck pain and neck stiffness.  Skin: Negative.   Neurological: Negative.   Psychiatric/Behavioral: Negative.   All other systems reviewed and are negative.   Allergies  Review of patient's allergies indicates no known allergies.  Home Medications   Prior to Admission medications   Medication Sig Start Date End Date Taking? Authorizing Provider  cephALEXin (KEFLEX) 500 MG capsule Take 1 capsule (500 mg total) by mouth 4 (four) times daily. 07/23/15   Janne Napoleon, NP  ibuprofen (ADVIL,MOTRIN) 600 MG tablet Take 1 tablet (600 mg total) by mouth every 6 (six) hours as needed. 11/15/13   Guss Bunde, MD  naproxen (NAPROSYN) 375 MG tablet Take 1 tablet (375 mg total) by mouth 2 (two) times daily. 07/23/15   Janne Napoleon, NP  norgestimate-ethinyl estradiol (ORTHO-CYCLEN,SPRINTEC,PREVIFEM) 0.25-35 MG-MCG tablet Take two active tablets by mouth daily for one week, then one active tablet by mouth daily for four weeks. Do not take placebo tablets. 06/21/14   Osborne Oman, MD  traMADol (ULTRAM) 50 MG tablet Take 1 tablet (50 mg total) by mouth every 6 (six) hours as needed. 07/23/15   Janne Napoleon, NP   Meds Ordered and Administered this Visit  Medications - No data to display  BP 117/75 mmHg  Pulse 105  Temp(Src) 98.9 F (37.2 C) (Oral)  Resp 16  SpO2 100% No data found.   Physical Exam  Constitutional: She is oriented to person, place, and time. She appears well-developed and well-nourished. No distress.  HENT:   Mouth/Throat: Oropharynx is clear and moist.  Eyes: Conjunctivae and EOM are normal.  Neck: Normal range of motion. Neck supple.  Tenderness to the left paracervical musculature including the trapezius.  Cardiovascular: Regular rhythm and normal heart sounds.   Apical heart rate 104  Pulmonary/Chest: Effort normal and breath sounds normal. No respiratory distress. She has no wheezes. She has no rales. She exhibits tenderness.  Marked tenderness to the left lower anterior chest wall including the costal margin as well as the lateral chest wall and ribs.  Abdominal: Soft. Bowel sounds are normal. She exhibits no mass. There is no tenderness. There is no rebound and no guarding.  Musculoskeletal: Normal range of motion. She exhibits tenderness. She exhibits no edema.  Tenderness to the left paracervical musculature, left trapezius, left lateral chest wall, left paralumbar musculature and left thigh musculature.  Lymphadenopathy:    She has no cervical adenopathy.  Neurological: She is alert and oriented to person, place, and time. No cranial nerve deficit. She exhibits normal muscle tone. Coordination normal.  Skin: Skin is warm and dry. No rash noted. No erythema.  Nursing note and vitals reviewed.   ED Course  Procedures (including critical care time)  Labs Review Labs Reviewed  POCT URINALYSIS DIP (DEVICE) - Abnormal; Notable for the following:    Ketones, ur TRACE (*)    Hgb urine dipstick MODERATE (*)    Protein, ur 30 (*)    Nitrite POSITIVE (*)    Leukocytes, UA TRACE (*)    All other components within normal limits  URINE CULTURE  POCT PREGNANCY, URINE   Results for orders placed or performed during the hospital encounter of 07/23/15  POCT urinalysis dip (device)  Result Value Ref Range   Glucose, UA NEGATIVE NEGATIVE mg/dL   Bilirubin Urine NEGATIVE NEGATIVE   Ketones, ur TRACE (A) NEGATIVE mg/dL   Specific Gravity, Urine 1.020 1.005 - 1.030   Hgb urine dipstick  MODERATE (A) NEGATIVE   pH 6.0 5.0 - 8.0   Protein, ur 30 (A) NEGATIVE mg/dL   Urobilinogen, UA 1.0 0.0 - 1.0 mg/dL   Nitrite POSITIVE (A) NEGATIVE   Leukocytes, UA TRACE (A) NEGATIVE  Pregnancy, urine POC  Result Value Ref Range   Preg Test, Ur NEGATIVE NEGATIVE     Imaging Review No results found.   Visual Acuity Review  Right Eye Distance:   Left Eye Distance:   Bilateral Distance:    Right Eye Near:   Left Eye Near:    Bilateral Near:         MDM   1. UTI (lower urinary tract infection)   2. Acute chest wall pain   3. Rib pain on left side   4. Left-sided low back pain without sciatica  5. Muscle pain    Meds ordered this encounter  Medications  . cephALEXin (KEFLEX) 500 MG capsule    Sig: Take 1 capsule (500 mg total) by mouth 4 (four) times daily.    Dispense:  28 capsule    Refill:  0    Order Specific Question:  Supervising Provider    Answer:  Billy Fischer [phone]  . naproxen (NAPROSYN) 375 MG tablet    Sig: Take 1 tablet (375 mg total) by mouth 2 (two) times daily.    Dispense:  20 tablet    Refill:  0    Order Specific Question:  Supervising Provider    Answer:  Billy Fischer [phone]  . traMADol (ULTRAM) 50 MG tablet    Sig: Take 1 tablet (50 mg total) by mouth every 6 (six) hours as needed.    Dispense:  15 tablet    Refill:  0    Order Specific Question:  Supervising Provider    Answer:  Billy Fischer [phone]   Lots of water Meds as directed, ice to muscle pain areas, then heat Urine culture pending   Janne Napoleon, NP 07/23/15 1415

## 2015-07-24 ENCOUNTER — Emergency Department (HOSPITAL_COMMUNITY): Payer: Medicaid Other

## 2015-07-24 ENCOUNTER — Emergency Department (HOSPITAL_COMMUNITY)
Admission: EM | Admit: 2015-07-24 | Discharge: 2015-07-25 | Disposition: A | Discharge status: Home / Self Care | Payer: Medicaid Other | Attending: Emergency Medicine | Admitting: Emergency Medicine

## 2015-07-24 ENCOUNTER — Encounter (HOSPITAL_COMMUNITY): Payer: Self-pay

## 2015-07-24 DIAGNOSIS — E876 Hypokalemia: Secondary | ICD-10-CM | POA: Insufficient documentation

## 2015-07-24 DIAGNOSIS — Z87891 Personal history of nicotine dependence: Secondary | ICD-10-CM | POA: Insufficient documentation

## 2015-07-24 DIAGNOSIS — N12 Tubulo-interstitial nephritis, not specified as acute or chronic: Secondary | ICD-10-CM | POA: Insufficient documentation

## 2015-07-24 LAB — URINALYSIS, ROUTINE W REFLEX MICROSCOPIC
GLUCOSE, UA: NEGATIVE mg/dL
KETONES UR: 40 mg/dL — AB
NITRITE: NEGATIVE
PH: 6 (ref 5.0–8.0)
Protein, ur: 30 mg/dL — AB
Specific Gravity, Urine: 1.026 (ref 1.005–1.030)

## 2015-07-24 LAB — CBC WITH DIFFERENTIAL/PLATELET
BASOS ABS: 0 10*3/uL (ref 0.0–0.1)
Basophils Relative: 0 %
Eosinophils Absolute: 0 10*3/uL (ref 0.0–0.7)
Eosinophils Relative: 0 %
HEMATOCRIT: 38.2 % (ref 36.0–46.0)
Hemoglobin: 12.5 g/dL (ref 12.0–15.0)
Lymphocytes Relative: 8 %
Lymphs Abs: 1 10*3/uL (ref 0.7–4.0)
MCH: 27.7 pg (ref 26.0–34.0)
MCHC: 32.7 g/dL (ref 30.0–36.0)
MCV: 84.7 fL (ref 78.0–100.0)
Monocytes Absolute: 1.2 10*3/uL — ABNORMAL HIGH (ref 0.1–1.0)
Monocytes Relative: 9 %
NEUTROS ABS: 11.1 10*3/uL — AB (ref 1.7–7.7)
NEUTROS PCT: 83 %
PLATELETS: 178 10*3/uL (ref 150–400)
RBC: 4.51 MIL/uL (ref 3.87–5.11)
RDW: 14.6 % (ref 11.5–15.5)
WBC: 13.3 10*3/uL — AB (ref 4.0–10.5)

## 2015-07-24 LAB — COMPREHENSIVE METABOLIC PANEL
ALBUMIN: 4.1 g/dL (ref 3.5–5.0)
ALT: 13 U/L — ABNORMAL LOW (ref 14–54)
AST: 17 U/L (ref 15–41)
Alkaline Phosphatase: 70 U/L (ref 38–126)
Anion gap: 8 (ref 5–15)
BUN: 8 mg/dL (ref 6–20)
CO2: 24 mmol/L (ref 22–32)
Calcium: 9.2 mg/dL (ref 8.9–10.3)
Chloride: 100 mmol/L — ABNORMAL LOW (ref 101–111)
Creatinine, Ser: 0.88 mg/dL (ref 0.44–1.00)
GFR calc Af Amer: >60 mL/min (ref >60)
GFR calc non Af Amer: >60 mL/min (ref >60)
GLUCOSE: 121 mg/dL — AB (ref 65–99)
POTASSIUM: 2.9 mmol/L — AB (ref 3.5–5.1)
SODIUM: 132 mmol/L — AB (ref 135–145)
Total Bilirubin: 0.7 mg/dL (ref 0.3–1.2)
Total Protein: 7.6 g/dL (ref 6.5–8.1)

## 2015-07-24 LAB — URINE MICROSCOPIC-ADD ON

## 2015-07-24 LAB — I-STAT CG4 LACTIC ACID, ED: LACTIC ACID, VENOUS: 0.82 mmol/L (ref 0.5–2.0)

## 2015-07-24 LAB — I-STAT BETA HCG BLOOD, ED (MC, WL, AP ONLY): I-stat hCG, quantitative: <5 m[IU]/mL (ref <5)

## 2015-07-24 MED ORDER — DEXTROSE 5 % IV SOLN
1.0000 g | Freq: Once | INTRAVENOUS | Status: AC
  Administered 2015-07-24: 1 g via INTRAVENOUS
  Filled 2015-07-24: qty 10

## 2015-07-24 MED ORDER — CYCLOBENZAPRINE HCL 10 MG PO TABS: 10.0000 mg | ORAL_TABLET | Freq: Two times a day (BID) | ORAL | Status: DC | PRN

## 2015-07-24 MED ORDER — POTASSIUM CHLORIDE CRYS ER 20 MEQ PO TBCR
40.0000 meq | EXTENDED_RELEASE_TABLET | Freq: Once | ORAL | Status: AC
  Administered 2015-07-25: 40 meq via ORAL
  Filled 2015-07-24: qty 2

## 2015-07-24 MED ORDER — IBUPROFEN 200 MG PO TABS
ORAL_TABLET | ORAL | Status: AC
  Filled 2015-07-24: qty 3

## 2015-07-24 MED ORDER — SODIUM CHLORIDE 0.9 % IV BOLUS (SEPSIS): 1000.0000 mL | Freq: Once | INTRAVENOUS | Status: DC

## 2015-07-24 MED ORDER — ONDANSETRON HCL 4 MG/2ML IJ SOLN
4.0000 mg | Freq: Once | INTRAMUSCULAR | Status: AC
  Administered 2015-07-24: 4 mg via INTRAVENOUS
  Filled 2015-07-24: qty 2

## 2015-07-24 MED ORDER — OXYCODONE-ACETAMINOPHEN 5-325 MG PO TABS: 1.0000 | ORAL_TABLET | Freq: Three times a day (TID) | ORAL | Status: DC | PRN

## 2015-07-24 MED ORDER — MORPHINE SULFATE (PF) 4 MG/ML IV SOLN
4.0000 mg | Freq: Once | INTRAVENOUS | Status: AC
  Administered 2015-07-24: 4 mg via INTRAVENOUS
  Filled 2015-07-24: qty 1

## 2015-07-24 MED ORDER — OXYCODONE-ACETAMINOPHEN 5-325 MG PO TABS
1.0000 | ORAL_TABLET | Freq: Once | ORAL | Status: AC
  Administered 2015-07-25: 1 via ORAL
  Filled 2015-07-24: qty 1

## 2015-07-24 MED ORDER — IBUPROFEN 400 MG PO TABS
600.0000 mg | ORAL_TABLET | Freq: Once | ORAL | Status: AC
  Administered 2015-07-24: 600 mg via ORAL

## 2015-07-24 MED ORDER — POTASSIUM CHLORIDE IN NACL 20-0.45 MEQ/L-% IV SOLN
INTRAVENOUS | Status: DC
  Administered 2015-07-24: 21:00:00 via INTRAVENOUS
  Filled 2015-07-24 (×2): qty 1000

## 2015-07-24 MED ORDER — ONDANSETRON 4 MG PO TBDP: 4.0000 mg | ORAL_TABLET | Freq: Three times a day (TID) | ORAL | Status: DC | PRN

## 2015-07-24 MED ORDER — IBUPROFEN 800 MG PO TABS: 800.0000 mg | ORAL_TABLET | Freq: Three times a day (TID) | ORAL | Status: DC

## 2015-07-24 NOTE — Discharge Instructions (Signed)
Pyelonephritis, Adult Pyelonephritis is a kidney infection. The kidneys are the organs that filter a person's blood and move waste out of the bloodstream and into the urine. Urine passes from the kidneys, through the ureters, and into the bladder. There are two main types of pyelonephritis:  Infections that come on quickly without any warning (acute pyelonephritis).  Infections that last for a long period of time (chronic pyelonephritis). In most cases, the infection clears up with treatment and does not cause further problems. More severe infections or chronic infections can sometimes spread to the bloodstream or lead to other problems with the kidneys. CAUSES This condition is usually caused by:  Bacteria traveling from the bladder to the kidney through infected urine. The urine in the bladder can become infected with bacteria from:  Bladder infection (cystitis).  Inflammation of the prostate gland (prostatitis).  Sexual intercourse, in females.  Bacteria traveling from the bloodstream to the kidney. RISK FACTORS This condition is more likely to develop in:  Pregnant women.  Older people.  People who have diabetes.  People who have kidney stones or bladder stones.  People who have other abnormalities of the kidney or ureter.  People who have a catheter placed in the bladder.  People who have cancer.  People who are sexually active.  Women who use spermicides.  People who have had a prior urinary tract infection. SYMPTOMS Symptoms of this condition include:  Frequent urination.  Strong or persistent urge to urinate.  Burning or stinging when urinating.  Abdominal pain.  Back pain.  Pain in the side or flank area.  Fever.  Chills.  Blood in the urine, or dark urine.  Nausea.  Vomiting. DIAGNOSIS This condition may be diagnosed based on:  Medical history and physical exam.  Urine tests.  Blood tests. You may also have imaging tests of the  kidneys, such as an ultrasound or CT scan. TREATMENT Treatment for this condition may depend on the severity of the infection.  If the infection is mild and is found early, you may be treated with antibiotic medicines taken by mouth. You will need to drink fluids to remain hydrated.  If the infection is more severe, you may need to stay in the hospital and receive antibiotics given directly into a vein through an IV tube. You may also need to receive fluids through an IV tube if you are not able to remain hydrated. After your hospital stay, you may need to take oral antibiotics for a period of time. Other treatments may be required, depending on the cause of the infection. HOME CARE INSTRUCTIONS Medicines  Take over-the-counter and prescription medicines only as told by your health care provider.  If you were prescribed an antibiotic medicine, take it as told by your health care provider. Do not stop taking the antibiotic even if you start to feel better. General Instructions  Drink enough fluid to keep your urine clear or pale yellow.  Avoid caffeine, tea, and carbonated beverages. They tend to irritate the bladder.  Urinate often. Avoid holding in urine for long periods of time.  Urinate before and after sex.  After a bowel movement, women should cleanse from front to back. Use each tissue only once.  Keep all follow-up visits as told by your health care provider. This is important. SEEK MEDICAL CARE IF:  Your symptoms do not get better after 2 days of treatment.  Your symptoms get worse.  You have a fever. SEEK IMMEDIATE MEDICAL CARE IF:  You  are unable to take your antibiotics or fluids.  You have shaking chills.  You vomit.  You have severe flank or back pain.  You have extreme weakness or fainting.   This information is not intended to replace advice given to you by your health care provider. Make sure you discuss any questions you have with your health care  provider.   Document Released: 01/27/2005 Document Revised: 10/18/2014 Document Reviewed: 05/22/2014 Elsevier Interactive Patient Education 2016 [employer].   Hypokalemia Hypokalemia means that the amount of potassium in the blood is lower than normal.Potassium is a chemical, called an electrolyte, that helps regulate the amount of fluid in the body. It also stimulates muscle contraction and helps nerves function properly.Most of the body's potassium is inside of cells, and only a very small amount is in the blood. Because the amount in the blood is so small, minor changes can be life-threatening. CAUSES  Antibiotics.  Diarrhea or vomiting.  Using laxatives too much, which can cause diarrhea.  Chronic kidney disease.  Water pills (diuretics).  Eating disorders (bulimia).  Low magnesium level.  Sweating a lot. SIGNS AND SYMPTOMS  Weakness.  Constipation.  Fatigue.  Muscle cramps.  Mental confusion.  Skipped heartbeats or irregular heartbeat (palpitations).  Tingling or numbness. DIAGNOSIS  Your health care provider can diagnose hypokalemia with blood tests. In addition to checking your potassium level, your health care provider may also check other lab tests. TREATMENT Hypokalemia can be treated with potassium supplements taken by mouth or adjustments in your current medicines. If your potassium level is very low, you may need to get potassium through a vein (IV) and be monitored in the hospital. A diet high in potassium is also helpful. Foods high in potassium are:  Nuts, such as peanuts and pistachios.  Seeds, such as sunflower seeds and pumpkin seeds.  Peas, lentils, and lima beans.  Whole grain and bran cereals and breads.  Fresh fruit and vegetables, such as apricots, avocado, bananas, cantaloupe, kiwi, oranges, tomatoes, asparagus, and potatoes.  Orange and tomato juices.  Red meats.  Fruit yogurt. HOME CARE INSTRUCTIONS  Take all medicines as  prescribed by your health care provider.  Maintain a healthy diet by including nutritious food, such as fruits, vegetables, nuts, whole grains, and lean meats.  If you are taking a laxative, be sure to follow the directions on the label. SEEK MEDICAL CARE IF:  Your weakness gets worse.  You feel your heart pounding or racing.  You are vomiting or having diarrhea.  You are diabetic and having trouble keeping your blood glucose in the normal range. SEEK IMMEDIATE MEDICAL CARE IF:  You have chest pain, shortness of breath, or dizziness.  You are vomiting or having diarrhea for more than 2 days.  You faint. MAKE SURE YOU:   Understand these instructions.  Will watch your condition.  Will get help right away if you are not doing well or get worse.   This information is not intended to replace advice given to you by your health care provider. Make sure you discuss any questions you have with your health care provider.   Document Released: 01/27/2005 Document Revised: 02/17/2014 Document Reviewed: 07/30/2012 Elsevier Interactive Patient Education 2016 [place].  Potassium Content of Foods Potassium is a mineral found in many foods and drinks. It helps keep fluids and minerals balanced in your body and affects how steadily your heart beats. Potassium also helps control your blood pressure and keep your muscles and nervous system  healthy. Certain health conditions and medicines may change the balance of potassium in your body. When this happens, you can help balance your level of potassium through the foods that you do or do not eat. Your health care provider or dietitian may recommend an amount of potassium that you should have each day. The following lists of foods provide the amount of potassium (in parentheses) per serving in each item. HIGH IN POTASSIUM  The following foods and beverages have 200 mg or more of potassium per serving:  Apricots, 2 raw or 5 dry (200  mg).  Artichoke, 1 medium (345 mg).  Avocado, raw,  each (245 mg).  Banana, 1 medium (425 mg).  Beans, lima, or baked beans, canned,  cup (280 mg).  Beans, white, canned,  cup (595 mg).  Beef roast, 3 oz (320 mg).  Beef, ground, 3 oz (270 mg).  Beets, raw or cooked,  cup (260 mg).  Bran muffin, 2 oz (300 mg).  Broccoli,  cup (230 mg).  Brussels sprouts,  cup (250 mg).  Cantaloupe,  cup (215 mg).  Cereal, 100% bran,  cup (200-400 mg).  Cheeseburger, single, fast food, 1 each (225-400 mg).  Chicken, 3 oz (220 mg).  Clams, canned, 3 oz (535 mg).  Crab, 3 oz (225 mg).  Dates, 5 each (270 mg).  Dried beans and peas,  cup (300-475 mg).  Figs, dried, 2 each (260 mg).  Fish: halibut, tuna, cod, snapper, 3 oz (480 mg).  Fish: salmon, haddock, swordfish, perch, 3 oz (300 mg).  Fish, tuna, canned 3 oz (200 mg).  [place] fries, fast food, 3 oz (470 mg).  Granola with fruit and nuts,  cup (200 mg).  Grapefruit juice,  cup (200 mg).  Greens, beet,  cup (655 mg).  Honeydew melon,  cup (200 mg).  Kale, raw, 1 cup (300 mg).  Kiwi, 1 medium (240 mg).  Kohlrabi, rutabaga, parsnips,  cup (280 mg).  Lentils,  cup (365 mg).  Mango, 1 each (325 mg).  Milk, chocolate, 1 cup (420 mg).  Milk: nonfat, low-fat, whole, buttermilk, 1 cup (350-380 mg).  Molasses, 1 Tbsp (295 mg).  Mushrooms,  cup (280) mg.  Nectarine, 1 each (275 mg).  Nuts: almonds, peanuts, hazelnuts, [place], cashew, mixed, 1 oz (200 mg).  Nuts, pistachios, 1 oz (295 mg).  Orange, 1 each (240 mg).  Orange juice,  cup (235 mg).  Papaya, medium,  fruit (390 mg).  Peanut butter, chunky, 2 Tbsp (240 mg).  Peanut butter, smooth, 2 Tbsp (210 mg).  Pear, 1 medium (200 mg).  Pomegranate, 1 whole (400 mg).  Pomegranate juice,  cup (215 mg).  Pork, 3 oz (350 mg).  Potato chips, salted, 1 oz (465 mg).  Potato, baked with skin, 1 medium (925 mg).  Potatoes, boiled,   cup (255 mg).  Potatoes, mashed,  cup (330 mg).  Prune juice,  cup (370 mg).  Prunes, 5 each (305 mg).  Pudding, chocolate,  cup (230 mg).  Pumpkin, canned,  cup (250 mg).  Raisins, seedless,  cup (270 mg).  Seeds, sunflower or pumpkin, 1 oz (240 mg).  Soy milk, 1 cup (300 mg).  Spinach,  cup (420 mg).  Spinach, canned,  cup (370 mg).  Sweet potato, baked with skin, 1 medium (450 mg).  Swiss chard,  cup (480 mg).  Tomato or vegetable juice,  cup (275 mg).  Tomato sauce or puree,  cup (400-550 mg).  Tomato, raw, 1 medium (290 mg).  Tomatoes, canned,  cup (200-300 mg).  [place], 3 oz (250 mg).  Wheat germ, 1 oz (250 mg).  Winter squash,  cup (250 mg).  Yogurt, plain or fruited, 6 oz (260-435 mg).  Zucchini,  cup (220 mg). MODERATE IN POTASSIUM The following foods and beverages have 50-200 mg of potassium per serving:  Apple, 1 each (150 mg).  Apple juice,  cup (150 mg).  Applesauce,  cup (90 mg).  Apricot nectar,  cup (140 mg).  Asparagus, small spears,  cup or 6 spears (155 mg).  Bagel, cinnamon raisin, 1 each (130 mg).  Bagel, egg or plain, 4 in., 1 each (70 mg).  Beans, green,  cup (90 mg).  Beans, yellow,  cup (190 mg).  Beer, regular, 12 oz (100 mg).  Beets, canned,  cup (125 mg).  Blackberries,  cup (115 mg).  Blueberries,  cup (60 mg).  Bread, whole wheat, 1 slice (70 mg).  Broccoli, raw,  cup (145 mg).  Cabbage,  cup (150 mg).  Carrots, cooked or raw,  cup (180 mg).  Cauliflower, raw,  cup (150 mg).  Celery, raw,  cup (155 mg).  Cereal, bran flakes, cup (120-150 mg).  Cheese, cottage,  cup (110 mg).  Cherries, 10 each (150 mg).  Chocolate, 1 oz bar (165 mg).  Coffee, brewed 6 oz (90 mg).  Corn,  cup or 1 ear (195 mg).  Cucumbers,  cup (80 mg).  Egg, large, 1 each (60 mg).  Eggplant,  cup (60 mg).  Endive, raw, cup (80 mg).  English muffin, 1 each (65 mg).  Fish, orange  roughy, 3 oz (150 mg).  Frankfurter, beef or pork, 1 each (75 mg).  Fruit cocktail,  cup (115 mg).  Grape juice,  cup (170 mg).  Grapefruit,  fruit (175 mg).  Grapes,  cup (155 mg).  Greens: kale, turnip, collard,  cup (110-150 mg).  Ice cream or frozen yogurt, chocolate,  cup (175 mg).  Ice cream or frozen yogurt, vanilla,  cup (120-150 mg).  Lemons, limes, 1 each (80 mg).  Lettuce, all types, 1 cup (100 mg).  Mixed vegetables,  cup (150 mg).  Mushrooms, raw,  cup (110 mg).  Nuts: walnuts, pecans, or macadamia, 1 oz (125 mg).  Oatmeal,  cup (80 mg).  Okra,  cup (110 mg).  Onions, raw,  cup (120 mg).  Peach, 1 each (185 mg).  Peaches, canned,  cup (120 mg).  Pears, canned,  cup (120 mg).  Peas, green, frozen,  cup (90 mg).  Peppers, green,  cup (130 mg).  Peppers, red,  cup (160 mg).  Pineapple juice,  cup (165 mg).  Pineapple, fresh or canned,  cup (100 mg).  Plums, 1 each (105 mg).  Pudding, vanilla,  cup (150 mg).  Raspberries,  cup (90 mg).  Rhubarb,  cup (115 mg).  Rice, wild,  cup (80 mg).  Shrimp, 3 oz (155 mg).  Spinach, raw, 1 cup (170 mg).  Strawberries,  cup (125 mg).  Summer squash  cup (175-200 mg).  Swiss chard, raw, 1 cup (135 mg).  Tangerines, 1 each (140 mg).  Tea, brewed, 6 oz (65 mg).  Turnips,  cup (140 mg).  Watermelon,  cup (85 mg).  Wine, red, table, 5 oz (180 mg).  Wine, white, table, 5 oz (100 mg). LOW IN POTASSIUM The following foods and beverages have less than 50 mg of potassium per serving.  Bread, white, 1 slice (30 mg).  Carbonated beverages, 12 oz (less than 5 mg).  Cheese, 1 oz (20-30 mg).  Cranberries,  cup (45 mg).  Cranberry juice cocktail,  cup (20 mg).  Fats and oils, 1 Tbsp (less than 5 mg).  Hummus, 1 Tbsp (32 mg).  Nectar: papaya, mango, or pear,  cup (35 mg).  Rice, white or brown,  cup (50 mg).  Spaghetti or macaroni,  cup cooked (30  mg).  Tortilla, flour or corn, 1 each (50 mg).  Waffle, 4 in., 1 each (50 mg).  Water chestnuts,  cup (40 mg).   This information is not intended to replace advice given to you by your health care provider. Make sure you discuss any questions you have with your health care provider.   Document Released: 09/10/2004 Document Revised: 02/01/2013 Document Reviewed: 12/24/2012 Elsevier Interactive Patient Education [employer].

## 2015-07-24 NOTE — ED Provider Notes (Signed)
CSN: [id number]     Arrival date & time 07/24/15  1649 History   First MD Initiated Contact with Patient 07/24/15 2015     Chief Complaint  Patient presents with  . Generalized Body Aches  . Emesis     (Consider location/radiation/quality/duration/timing/severity/associated sxs/prior Treatment) HPI   Cindy Armstrong is a 25 year old female past medical history of anemia and UTI/pyelonephritis (with pregnancies) presents to the emergency department with complaint of myalgias, worse on her left side that on her right side and known UTI which was diagnosed yesterday, she has taken less than 24 hours of Keflex.  She reports that she has been ill for the past 4 days and today she had new vomiting with 4 episodes, emesis reportedly nonbloody and nonbilious. Myalgias have become more severe especially in her left ribs, left flank and wrapping around to her right flank. Her pain is severe, constant, worsening, unrelieved with over-the-counter medication and tramadol which she was prescribed yesterday.  Pain is worsened with movement or palpation. She has been having fevers and nausea.  She did have pyelonephritis in the past and she was pregnant and she reports that this feels similar.  She denies vaginal complaints including no vaginal discharge, no vaginal itching, no dyspareunia, genital rash or lesions.  She denies any diarrhea or constipation.  Her left chest did hurt to 3 days ago, she states that when she was evaluated at urgent care they never did x-ray and she denies any cough, wheeze or shortness of breath.  Past Medical History  Diagnosis Date  . Anemia    Past Surgical History  Procedure Laterality Date  . Wisdom teeth removal (11/07/13) Bilateral    Family History  Problem Relation Age of Onset  . Alcohol abuse Neg Hx   . Arthritis Neg Hx   . Asthma Neg Hx   . Birth defects Neg Hx   . Cancer Neg Hx   . COPD Neg Hx   . Depression Neg Hx   . Early death Neg Hx   . Hearing loss  Neg Hx   . Heart disease Neg Hx   . Hyperlipidemia Neg Hx   . Hypertension Neg Hx   . Kidney disease Neg Hx   . Mental illness Neg Hx   . Mental retardation Neg Hx   . Miscarriages / Stillbirths Neg Hx   . Stroke Neg Hx   . Vision loss Neg Hx   . Other Neg Hx   . Diabetes Mother   . Drug abuse Mother   . Learning disabilities Brother    Social History  Substance Use Topics  . Smoking status: Former Smoker -- 3 years  . Smokeless tobacco: Never Used  . Alcohol Use: No   OB History    Gravida Para Term Preterm AB TAB SAB Ectopic Multiple Living   2 2 0 2 0 0 0 0 0 2      Review of Systems  All other systems reviewed and are negative.     Allergies  Review of patient's allergies indicates no known allergies.  Home Medications   Prior to Admission medications   Medication Sig Start Date End Date Taking? Authorizing Provider  acetaminophen (TYLENOL) 325 MG tablet Take 650 mg by mouth 2 (two) times daily as needed for mild pain or headache.   Yes Historical Provider, MD  cephALEXin (KEFLEX) 500 MG capsule Take 1 capsule (500 mg total) by mouth 4 (four) times daily. 07/23/15  Yes Janne Napoleon, NP  naproxen (NAPROSYN) 375 MG tablet Take 1 tablet (375 mg total) by mouth 2 (two) times daily. 07/23/15  Yes Janne Napoleon, NP  norgestimate-ethinyl estradiol (ORTHO-CYCLEN,SPRINTEC,PREVIFEM) 0.25-35 MG-MCG tablet Take two active tablets by mouth daily for one week, then one active tablet by mouth daily for four weeks. Do not take placebo tablets. 06/21/14  Yes Osborne Oman, MD  traMADol (ULTRAM) 50 MG tablet Take 1 tablet (50 mg total) by mouth every 6 (six) hours as needed. Patient taking differently: Take 50 mg by mouth every 6 (six) hours as needed for moderate pain.  07/23/15  Yes Janne Napoleon, NP  cyclobenzaprine (FLEXERIL) 10 MG tablet Take 1 tablet (10 mg total) by mouth 2 (two) times daily as needed for muscle spasms. 07/24/15   Delsa Grana, PA-C  ibuprofen (ADVIL,MOTRIN) 800 MG tablet  Take 1 tablet (800 mg total) by mouth 3 (three) times daily. 07/24/15   Delsa Grana, PA-C  ondansetron (ZOFRAN ODT) 4 MG disintegrating tablet Take 1 tablet (4 mg total) by mouth every 8 (eight) hours as needed for nausea or vomiting. 07/24/15   Delsa Grana, PA-C  oxyCODONE-acetaminophen (PERCOCET) 5-325 MG tablet Take 1 tablet by mouth every 8 (eight) hours as needed for severe pain. 07/24/15   Delsa Grana, PA-C   BP 117/63 mmHg  Pulse 75  Temp(Src) 103 F (39.4 C) (Oral)  Resp 16  SpO2 100% Physical Exam  Constitutional: She is oriented to person, place, and time. She appears well-developed and well-nourished. No distress.  Young female, appears stated age, nontoxic in appearance, appears uncomfortable  HENT:  Head: Normocephalic and atraumatic.  Nose: Nose normal.  Mouth/Throat: No oropharyngeal exudate.  Oral mucosa dry  Eyes: Conjunctivae and EOM are normal. Pupils are equal, round, and reactive to light. Right eye exhibits no discharge. Left eye exhibits no discharge. No scleral icterus.  Neck: Trachea normal, normal range of motion, full passive range of motion without pain and phonation normal. Neck supple. No JVD present. Muscular tenderness present. No spinous process tenderness present. No rigidity. No tracheal deviation, no edema, no erythema and normal range of motion present. No thyromegaly present.  Cardiovascular: Normal rate, regular rhythm, normal heart sounds and intact distal pulses.  Exam reveals no gallop and no friction rub.   No murmur heard. Pulmonary/Chest: Effort normal and breath sounds normal. No accessory muscle usage. No respiratory distress. She has no decreased breath sounds. She has no wheezes. She has no rhonchi. She has no rales.   She exhibits tenderness.    Abdominal: Soft. Bowel sounds are normal. She exhibits no distension and no mass. There is tenderness. There is guarding. There is no rebound.  Generalized abdominal tenderness to the lower abdomen  with guarding, no rebound tenderness, left CVA tenderness  Musculoskeletal: Normal range of motion. She exhibits tenderness. She exhibits no edema.  Generalized tenderness to palpation to his left ribs and paraspinal tenderness L>R from cervical to lumbar spine, no step-off, no midline tenderness  Lymphadenopathy:    She has no cervical adenopathy.  Neurological: She is alert and oriented to person, place, and time. She has normal reflexes. No cranial nerve deficit. She exhibits normal muscle tone. Coordination normal.  Skin: Skin is warm and dry. No rash noted. She is not diaphoretic. No erythema. No pallor.  Psychiatric: She has a normal mood and affect. Her behavior is normal. Judgment and thought content normal.  Nursing note and vitals reviewed.   ED Course  Procedures (including critical care time) Labs Review  Labs Reviewed  COMPREHENSIVE METABOLIC PANEL - Abnormal; Notable for the following:    Sodium 132 (*)    Potassium 2.9 (*)    Chloride 100 (*)    Glucose, Bld 121 (*)    ALT 13 (*)    All other components within normal limits  CBC WITH DIFFERENTIAL/PLATELET - Abnormal; Notable for the following:    WBC 13.3 (*)    Neutro Abs 11.1 (*)    Monocytes Absolute 1.2 (*)    All other components within normal limits  URINALYSIS, ROUTINE W REFLEX MICROSCOPIC (NOT AT [facility]) - Abnormal; Notable for the following:    APPearance TURBID (*)    Hgb urine dipstick MODERATE (*)    Bilirubin Urine SMALL (*)    Ketones, ur 40 (*)    Protein, ur 30 (*)    Leukocytes, UA SMALL (*)    All other components within normal limits  URINE MICROSCOPIC-ADD ON - Abnormal; Notable for the following:    Squamous Epithelial / LPF 6-30 (*)    Bacteria, UA MANY (*)    All other components within normal limits  URINE CULTURE  I-STAT BETA HCG BLOOD, ED ([facility], [facility], [facility] ONLY)  I-STAT CG4 LACTIC ACID, ED  I-STAT CG4 LACTIC ACID, ED    Imaging Review Dg Chest 2 View  07/24/2015  CLINICAL DATA:  Body  aches, vomiting and fever for 3 days. EXAM: CHEST  2 VIEW COMPARISON:  PA and lateral chest 02/21/2012. FINDINGS: The lungs are clear. Heart size is normal. There is no pneumothorax or pleural effusion. No focal bony abnormality. IMPRESSION: Negative chest. Electronically Signed   By: Inge Rise M.D.   On: 07/24/2015 17:41   I have personally reviewed and evaluated these images and lab results as part of my medical decision-making.   EKG Interpretation None      MDM   Patient with known UTI with 1 day treatment of Keflex, presents to the ER with fever, nausea, vomiting, worsening abdominal pain and generalized myalgias  In the ER was febrile the oral temperature of 103, that have leukocytosis of 13.3, was hypokalemic at 2.9, mildly hyponatremic 132.  Lactic acid negative.  Patient's urinalysis from yesterday was positive for UTI, today he UA with moderate blood, positive ketones, small leukocytes and many bacteria.  Urine culture from yesterday is pending.  Patient will continue Keflex, with less than 1 day of treatment, does not constitute outpatient antibiotic treatment failure, will continue Keflex and compared to culture and sensitivity which is pending.  In the ER the patient was given IVF with 20 mEq of K+, pain medication and Zofran.  She was able to tolerate PO's, oral pain meds and oral potassium replacement.  Patient has been hemodynamically stable, no active vomiting, feel she is safe to discharge home with Zofran, pain medication and antibiotics.  I reviewed the plan with pending culture, she understands she will be called if she needs a change in antibiotic treatment. Return precautions reviewed. Patient verbalized understanding.  She was discharged home in good condition, VSS. Filed Vitals:   07/24/15 2030 07/24/15 2115 07/24/15 2215 07/24/15 2351  BP: 118/65 122/60 120/61 117/63  Pulse: 78 85 81 75  Temp:      TempSrc:      Resp:   18 16  SpO2: 100% 100% 100% 100%     Final diagnoses:  Pyelonephritis  Hypokalemia     Delsa Grana, PA-C 07/25/15 0008  Quintella Reichert, MD 07/25/15 0110

## 2015-07-24 NOTE — ED Notes (Signed)
Pt given water for fluid challenge, pt states she can only take small sips of water.

## 2015-07-24 NOTE — ED Notes (Signed)
Pt said she felt dehydrated, informed Erin-RN.

## 2015-07-24 NOTE — ED Notes (Signed)
Patient complains of body aches and vomiting, seen at urgent care yesterday and received antibiotics for UTI. Taking meds as prescribed. Reports chills for same

## 2015-07-25 LAB — URINE CULTURE: Special Requests: NORMAL

## 2015-07-28 ENCOUNTER — Telehealth (HOSPITAL_COMMUNITY): Payer: Self-pay | Admitting: Emergency Medicine

## 2015-07-28 NOTE — ED Notes (Signed)
Called pt and notified of recent lab results from visit 6/12 Pt ID'd properly... Reports feeling better and sx have somewhat subsided... Was seen at [facility] ED on 6/13 as well  Per Dr. Valere Dross,  Notes Recorded by Sherlene Shams, MD on 07/25/2015 at 4:50 PM Please let patient know that that urine culture was positive for E coli, sensitive to cephalexin rx given at [facility] visit 07/23/15.  Finish cephalexin; recheck or followup pcp/James Roselie Awkward for persistent symptoms. LM  Adv pt if sx are not getting better to return  Pt verb understanding

## 2015-10-11 ENCOUNTER — Emergency Department (HOSPITAL_COMMUNITY)
Admission: EM | Admit: 2015-10-11 | Discharge: 2015-10-11 | Disposition: A | Discharge status: Home / Self Care | Payer: Medicaid Other | Attending: Emergency Medicine | Admitting: Emergency Medicine

## 2015-10-11 ENCOUNTER — Encounter (HOSPITAL_COMMUNITY): Payer: Self-pay

## 2015-10-11 DIAGNOSIS — Z79899 Other long term (current) drug therapy: Secondary | ICD-10-CM | POA: Insufficient documentation

## 2015-10-11 DIAGNOSIS — Z87891 Personal history of nicotine dependence: Secondary | ICD-10-CM | POA: Insufficient documentation

## 2015-10-11 DIAGNOSIS — K029 Dental caries, unspecified: Secondary | ICD-10-CM | POA: Insufficient documentation

## 2015-10-11 MED ORDER — NAPROXEN 500 MG PO TABS: 500.0000 mg | ORAL_TABLET | Freq: Two times a day (BID) | ORAL | 0 refills | Status: DC

## 2015-10-11 MED ORDER — PENICILLIN V POTASSIUM 500 MG PO TABS: 500.0000 mg | ORAL_TABLET | Freq: Four times a day (QID) | ORAL | 0 refills | Status: AC

## 2015-10-11 MED ORDER — TRAMADOL HCL 50 MG PO TABS
50.0000 mg | ORAL_TABLET | Freq: Once | ORAL | Status: AC
  Administered 2015-10-11: 50 mg via ORAL
  Filled 2015-10-11: qty 1

## 2015-10-11 MED ORDER — KETOROLAC TROMETHAMINE 30 MG/ML IJ SOLN
30.0000 mg | Freq: Once | INTRAMUSCULAR | Status: AC
  Administered 2015-10-11: 30 mg via INTRAMUSCULAR
  Filled 2015-10-11: qty 1

## 2015-10-11 NOTE — ED Provider Notes (Signed)
[place] DEPT Provider Note   CSN: [id number] Arrival date & time: 10/11/15  0900   By signing my name below, I, Estanislado Pandy, attest that this documentation has been prepared under the direction and in the presence of Delrae Rend, [place] Electronically Signed: Estanislado Pandy, Scribe. 10/11/2015. 9:26 AM.  History   Chief Complaint Chief Complaint  Patient presents with  . Dental Pain    HPI Comments:  Cindy Armstrong is a 25 y.o. female who presents to the Emergency Department complaining of sudden onset, recently worsening dental pain x2 months. Pt reports that she broke a right upper molar in 07/2015 and has had constant pain, but pain has worsened over the past 2 days. Pt complains of fever and chills. Pt has taken tylenol, percocet, and tramadol with minimal ti no relief. Pt denies difficulty swallowing.    Past Medical History:  Diagnosis Date  . Anemia     Patient Active Problem List   Diagnosis Date Noted  . Preterm premature rupture of membranes in third trimester 11/08/2013  . Preterm labor 10/09/2013  . GBS (group B Streptococcus carrier), +RV culture, currently pregnant 09/08/2013  . Short cervix in second trimester, antepartum 09/07/2013  . Threatened preterm labor 09/06/2013  . Abnormal fetal ultrasound 08/12/2013  . Pregnancy with history of pre-term labor 07/20/2013  . Pyelonephritis complicating pregnancy in second trimester, antepartum 07/15/2013    Past Surgical History:  Procedure Laterality Date  . Wisdom teeth removal (11/07/13) Bilateral     OB History    Gravida Para Term Preterm AB Living   2 2 0 2 0 2   SAB TAB Ectopic Multiple Live Births   0 0 0 0 2       Home Medications    Prior to Admission medications   Medication Sig Start Date End Date Taking? Authorizing Provider  acetaminophen (TYLENOL) 325 MG tablet Take 650 mg by mouth 2 (two) times daily as needed for mild pain or headache.    Historical Provider, MD  cephALEXin  (KEFLEX) 500 MG capsule Take 1 capsule (500 mg total) by mouth 4 (four) times daily. 07/23/15   Janne Napoleon, NP  cyclobenzaprine (FLEXERIL) 10 MG tablet Take 1 tablet (10 mg total) by mouth 2 (two) times daily as needed for muscle spasms. 07/24/15   Delsa Grana, PA-C  ibuprofen (ADVIL,MOTRIN) 800 MG tablet Take 1 tablet (800 mg total) by mouth 3 (three) times daily. 07/24/15   Delsa Grana, PA-C  naproxen (NAPROSYN) 375 MG tablet Take 1 tablet (375 mg total) by mouth 2 (two) times daily. 07/23/15   Janne Napoleon, NP  norgestimate-ethinyl estradiol (ORTHO-CYCLEN,SPRINTEC,PREVIFEM) 0.25-35 MG-MCG tablet Take two active tablets by mouth daily for one week, then one active tablet by mouth daily for four weeks. Do not take placebo tablets. 06/21/14   Osborne Oman, MD  ondansetron (ZOFRAN ODT) 4 MG disintegrating tablet Take 1 tablet (4 mg total) by mouth every 8 (eight) hours as needed for nausea or vomiting. 07/24/15   Delsa Grana, PA-C  oxyCODONE-acetaminophen (PERCOCET) 5-325 MG tablet Take 1 tablet by mouth every 8 (eight) hours as needed for severe pain. 07/24/15   Delsa Grana, PA-C  traMADol (ULTRAM) 50 MG tablet Take 1 tablet (50 mg total) by mouth every 6 (six) hours as needed. Patient taking differently: Take 50 mg by mouth every 6 (six) hours as needed for moderate pain.  07/23/15   Janne Napoleon, NP    Family History Family History  Problem Relation Age  of Onset  . Diabetes Mother   . Drug abuse Mother   . Learning disabilities Brother   . Alcohol abuse Neg Hx   . Arthritis Neg Hx   . Asthma Neg Hx   . Birth defects Neg Hx   . Cancer Neg Hx   . COPD Neg Hx   . Depression Neg Hx   . Early death Neg Hx   . Hearing loss Neg Hx   . Heart disease Neg Hx   . Hyperlipidemia Neg Hx   . Hypertension Neg Hx   . Kidney disease Neg Hx   . Mental illness Neg Hx   . Mental retardation Neg Hx   . Miscarriages / Stillbirths Neg Hx   . Stroke Neg Hx   . Vision loss Neg Hx   . Other Neg Hx     Social  History Social History  Substance Use Topics  . Smoking status: Former Smoker    Years: 3.00  . Smokeless tobacco: Never Used  . Alcohol use No     Allergies   Review of patient's allergies indicates no known allergies.   Review of Systems Review of Systems  HENT: Dental problem: Upper right dentail pain.    10 Systems reviewed and are negative for acute change except as noted in the HPI.   Physical Exam Updated Vital Signs BP 141/84   Pulse 74   Temp 98.5 F (36.9 C) (Oral)   Resp 18   Ht 5\' 4"  (1.626 m)   Wt 110 lb (49.9 kg)   SpO2 100%   BMI 18.88 kg/m   Physical Exam  Constitutional: She appears well-developed and well-nourished. No distress.  HENT:  Head: Normocephalic and atraumatic.  Tooth #3 with large area of decay extending past gingival line. No gross abscess. No bleeding or drainage. Uvula midline no trismus.  Eyes: Conjunctivae are normal.  Cardiovascular: Normal rate.   Pulmonary/Chest: Effort normal.  Abdominal: She exhibits no distension.  Neurological: She is alert.  Skin: Skin is warm and dry.  Psychiatric: She has a normal mood and affect.  Nursing note and vitals reviewed.    ED Treatments / Results  DIAGNOSTIC STUDIES:  Oxygen Saturation is 100% on RA, normal by my interpretation.    COORDINATION OF CARE:  9:26 AM Will provide injection for pain relief. Discussed treatment plan with pt at bedside and pt agreed to plan.  Labs (all labs ordered are listed, but only abnormal results are displayed) Labs Reviewed - No data to display  EKG  EKG Interpretation None       Radiology No results found.  Procedures Procedures (including critical care time)  Medications Ordered in ED Medications  ketorolac (TORADOL) 30 MG/ML injection 30 mg (30 mg Intramuscular Given 10/11/15 0940)  traMADol (ULTRAM) tablet 50 mg (50 mg Oral Given 10/11/15 0940)     Initial Impression / Assessment and Plan / ED Course  I have reviewed the  triage vital signs and the nursing notes.  Pertinent labs & imaging results that were available during my care of the patient were reviewed by me and considered in my medical decision making (see chart for details).  Clinical Course   Patient with dentalgia.  No abscess requiring immediate incision and drainage.  Exam not concerning for Ludwig's angina or pharyngeal abscess. She has a largely carious tooth #3 that will need repair/root canal vs extraction. I spoke with the office of Dr. Maurilio Lovely who is listed in [facility] as the on-call  dentist but their office states that they were unaware of this and also not accepting new patients at this time. Given nonemergent nature of pt's presentation we will treat with pen VK and naproxen. Local infiltration of 1.7 mL of 0.5% marcaine without epi given in the ED with toradol and tramadol. Pt instructed to follow-up with dentist. Dental resource guide given. Discussed return precautions. Pt safe for discharge.   Final Clinical Impressions(s) / ED Diagnoses   Final diagnoses:  Pain due to dental caries    New Prescriptions New Prescriptions   NAPROXEN (NAPROSYN) 500 MG TABLET    Take 1 tablet (500 mg total) by mouth 2 (two) times daily.   PENICILLIN V POTASSIUM (VEETID) 500 MG TABLET    Take 1 tablet (500 mg total) by mouth 4 (four) times daily.   I personally performed the services described in this documentation, which was scribed in my presence. The recorded information has been reviewed and is accurate.   Anne Ng, PA-C 10/11/15 [id number]    Fredia Sorrow, MD 10/12/15 [phone]

## 2015-10-11 NOTE — ED Notes (Signed)
Extreme pain in a tooth in upper R side. In need of a root canal and pt. sts that it tastes like something is draining, no major drainage noted upon assessment

## 2015-10-11 NOTE — ED Notes (Signed)
Pt. Leaving ambulatory and pain was decreased. Verbalizes understanding of follow up.

## 2015-10-11 NOTE — ED Triage Notes (Signed)
Patient here with broken tooth right upper. States broken since June with increased pain that has gotten worse x 2 days, minimal relief with NSAIDS.

## 2015-10-11 NOTE — Discharge Instructions (Signed)
Take antibiotics as prescribed. Call the clinics listed to schedule a dental appointment as soon as possible.

## 2015-11-01 ENCOUNTER — Encounter: Payer: Self-pay | Admitting: *Deleted

## 2016-03-07 ENCOUNTER — Encounter: Payer: Self-pay | Admitting: Internal Medicine

## 2016-03-07 ENCOUNTER — Ambulatory Visit (INDEPENDENT_AMBULATORY_CARE_PROVIDER_SITE_OTHER): Payer: Self-pay | Admitting: Internal Medicine

## 2016-03-07 ENCOUNTER — Other Ambulatory Visit (HOSPITAL_COMMUNITY)
Admission: RE | Admit: 2016-03-07 | Discharge: 2016-03-07 | Disposition: A | Discharge status: Home / Self Care | Payer: Medicaid Other | Source: Ambulatory Visit | Attending: Family Medicine | Admitting: Family Medicine

## 2016-03-07 VITALS — BP 100/60 | HR 86 | Temp 98.3°F | Ht 64.0 in | Wt 123.0 lb

## 2016-03-07 DIAGNOSIS — Z23 Encounter for immunization: Secondary | ICD-10-CM

## 2016-03-07 DIAGNOSIS — Z113 Encounter for screening for infections with a predominantly sexual mode of transmission: Secondary | ICD-10-CM | POA: Insufficient documentation

## 2016-03-07 DIAGNOSIS — Z975 Presence of (intrauterine) contraceptive device: Secondary | ICD-10-CM

## 2016-03-07 DIAGNOSIS — N921 Excessive and frequent menstruation with irregular cycle: Secondary | ICD-10-CM

## 2016-03-07 DIAGNOSIS — N898 Other specified noninflammatory disorders of vagina: Secondary | ICD-10-CM

## 2016-03-07 LAB — POCT WET PREP (WET MOUNT)
CLUE CELLS WET PREP WHIFF POC: POSITIVE
Trichomonas Wet Prep HPF POC: ABSENT

## 2016-03-07 LAB — POCT HEMOGLOBIN: Hemoglobin: 11.8 g/dL — AB (ref 12.2–16.2)

## 2016-03-07 MED ORDER — METRONIDAZOLE 500 MG PO TABS: 500.0000 mg | ORAL_TABLET | Freq: Two times a day (BID) | ORAL | 0 refills | Status: DC

## 2016-03-07 MED ORDER — NORGESTIMATE-ETH ESTRADIOL 0.25-35 MG-MCG PO TABS: ORAL_TABLET | ORAL | 0 refills | Status: DC

## 2016-03-07 NOTE — Patient Instructions (Addendum)
It was so nice to meet you!  I have sent in a prescription for Sprintec. Please take 1 tablet daily for the next two months to try to break this bleeding cycle. We will see you back in 2 months to see how you are doing. We may need to consider taking out your IUD.  You have bacterial vaginosis. I have sent Flagyl into your pharmacy. You should take 1 tablet twice a day for 7 days.  -Dr. Brett Albino

## 2016-03-07 NOTE — Assessment & Plan Note (Signed)
Having periods for 3 weeks out of the month. Heavy bleeding for 1 of those months. Had Mirena placed November 2015. Pt endorsing occasional lightheadedness. - POC Hgb 11.8 in clinic today - Will treat with Sprintec x 2 months to help get her cycle more regular. - Follow-up in 2 months. If she continues to have irregular, heavy bleeding, will need to consider removing IUD.

## 2016-03-07 NOTE — Assessment & Plan Note (Signed)
Has been going on for a couple of months. Has a history of UTIs and BV. Pelvic exam unremarkable. - Wet prep performed in clinic today with many clue cells and positive whiff testing. - Treat with Flagyl bid x 7 days.

## 2016-03-07 NOTE — Progress Notes (Signed)
   [facility] Phone: [phone]  Subjective:  Armstrong is a 26 year old female presenting to clinic for a new patient appointment. She also has some concerns that she wants to discuss, including vaginal discharge and irregular vaginal bleeding.  Vaginal Discharge: Has been going on for a couple of months. Discharge is pinkish, milky, or sometimes yellow. No itchiness, no vaginal lesions, no odor. No dysuria, fevers, or chills. She has been sexually active with just one female partner for the last 6 years. No other sexual partners.  Breakthrough Bleeding with IUD: She had an IUD placed in November 2015 after her child was born. Since then, she has been having bleeding for 3 weeks out of the month. She has a week of light bleeding, a week of heavy bleeding, another week of light bleeding, and then she will have a week without her period. She endorses occasional lightheadedness when she has heavy bleeding. No palpitations, no SOB.  ROS: See HPI for pertinent positives and negatives  Past Medical History- none  Past Surgical History- none  Family history- -Mother- drug abuse -Father- drug/alcohol abuse -Maternal aunt- HTN  Social history- patient is a former smoker. She used to smoke Black and Milds occasionally, but quit in 2015. No alcohol use. No drug use. Sexually active with one female partner for the last 6 years. Lives at home with 2 sons, ages 44 and 74.  Objective: BP 100/60   Pulse 86   Temp 98.3 F (36.8 C) (Oral)   Ht 5\' 4"  (1.626 m)   Wt 123 lb (55.8 kg)   LMP 03/06/2016   SpO2 99%   BMI 21.11 kg/m  Gen: NAD, alert, cooperative with exam HEENT: NCAT, EOMI, MMM Neck: FROM, supple CV: RRR, no murmur Resp: CTABL, no wheezes, normal work of breathing GU: External genitalia normal, vaginal walls normal, small amount of brown discharge present, cervix normal in appearance, IUD strings visualized. Msk: No edema, warm, normal tone, moves UE/LE  spontaneously Neuro: Alert and oriented, no gross deficits Skin: No rashes, no lesions Psych: Appropriate behavior  Assessment/Plan: Vaginal Discharge: Has been going on for a couple of months. Has a history of UTIs and BV. Pelvic exam unremarkable. - Wet prep performed in clinic today with many clue cells and positive whiff testing. - Treat with Flagyl bid x 7 days.  Breakthrough bleeding with IUD: Having periods for 3 weeks out of the month. Heavy bleeding for 1 of those months. Had Mirena placed November 2015. Pt endorsing occasional lightheadedness. - POC Hgb 11.8 in clinic today - Will treat with Sprintec x 2 months to help get her cycle more regular. - Follow-up in 2 months. If she continues to have irregular, heavy bleeding, will need to consider removing IUD.   Hyman Bible, MD PGY-2

## 2016-03-10 ENCOUNTER — Emergency Department (HOSPITAL_COMMUNITY): Payer: Self-pay

## 2016-03-10 ENCOUNTER — Encounter (HOSPITAL_COMMUNITY): Payer: Self-pay | Admitting: Emergency Medicine

## 2016-03-10 DIAGNOSIS — J069 Acute upper respiratory infection, unspecified: Secondary | ICD-10-CM | POA: Insufficient documentation

## 2016-03-10 DIAGNOSIS — Z87891 Personal history of nicotine dependence: Secondary | ICD-10-CM | POA: Insufficient documentation

## 2016-03-10 LAB — BASIC METABOLIC PANEL
ANION GAP: 10 (ref 5–15)
BUN: 13 mg/dL (ref 6–20)
CHLORIDE: 105 mmol/L (ref 101–111)
CO2: 23 mmol/L (ref 22–32)
Calcium: 9.5 mg/dL (ref 8.9–10.3)
Creatinine, Ser: 1.03 mg/dL — ABNORMAL HIGH (ref 0.44–1.00)
GFR calc Af Amer: >60 mL/min (ref >60)
Glucose, Bld: 97 mg/dL (ref 65–99)
POTASSIUM: 3.9 mmol/L (ref 3.5–5.1)
SODIUM: 138 mmol/L (ref 135–145)

## 2016-03-10 LAB — CBC WITH DIFFERENTIAL/PLATELET
Basophils Absolute: 0 10*3/uL (ref 0.0–0.1)
Basophils Relative: 0 %
EOS PCT: 0 %
Eosinophils Absolute: 0 10*3/uL (ref 0.0–0.7)
HCT: 36.8 % (ref 36.0–46.0)
Hemoglobin: 12.3 g/dL (ref 12.0–15.0)
LYMPHS ABS: 1.7 10*3/uL (ref 0.7–4.0)
Lymphocytes Relative: 13 %
MCH: 28.7 pg (ref 26.0–34.0)
MCHC: 33.4 g/dL (ref 30.0–36.0)
MCV: 85.8 fL (ref 78.0–100.0)
Monocytes Absolute: 0.8 10*3/uL (ref 0.1–1.0)
Monocytes Relative: 6 %
NEUTROS PCT: 81 %
Neutro Abs: 10.9 10*3/uL — ABNORMAL HIGH (ref 1.7–7.7)
PLATELETS: 203 10*3/uL (ref 150–400)
RBC: 4.29 MIL/uL (ref 3.87–5.11)
RDW: 14.8 % (ref 11.5–15.5)
WBC: 13.4 10*3/uL — AB (ref 4.0–10.5)

## 2016-03-10 LAB — URINALYSIS, ROUTINE W REFLEX MICROSCOPIC
Bilirubin Urine: NEGATIVE
GLUCOSE, UA: NEGATIVE mg/dL
KETONES UR: 20 mg/dL — AB
Leukocytes, UA: NEGATIVE
Nitrite: NEGATIVE
PH: 7 (ref 5.0–8.0)
Protein, ur: NEGATIVE mg/dL
Specific Gravity, Urine: 1.015 (ref 1.005–1.030)

## 2016-03-10 LAB — I-STAT BETA HCG BLOOD, ED (MC, WL, AP ONLY): I-stat hCG, quantitative: <5 m[IU]/mL (ref <5)

## 2016-03-10 MED ORDER — ACETAMINOPHEN 325 MG PO TABS
ORAL_TABLET | ORAL | Status: AC
  Filled 2016-03-10: qty 2

## 2016-03-10 MED ORDER — ACETAMINOPHEN 325 MG PO TABS
650.0000 mg | ORAL_TABLET | Freq: Once | ORAL | Status: AC
  Administered 2016-03-10: 650 mg via ORAL

## 2016-03-10 NOTE — ED Triage Notes (Addendum)
Patient reports generalized body aches with fever/chills , nasal congestion/rhinorrhea and productive cough for > 1 week .

## 2016-03-11 ENCOUNTER — Emergency Department (HOSPITAL_COMMUNITY)
Admission: EM | Admit: 2016-03-11 | Discharge: 2016-03-11 | Disposition: A | Discharge status: Home / Self Care | Payer: Self-pay | Attending: Emergency Medicine | Admitting: Emergency Medicine

## 2016-03-11 DIAGNOSIS — J069 Acute upper respiratory infection, unspecified: Secondary | ICD-10-CM

## 2016-03-11 DIAGNOSIS — B9789 Other viral agents as the cause of diseases classified elsewhere: Secondary | ICD-10-CM

## 2016-03-11 LAB — CERVICOVAGINAL ANCILLARY ONLY
CHLAMYDIA, DNA PROBE: NEGATIVE
NEISSERIA GONORRHEA: NEGATIVE

## 2016-03-11 MED ORDER — METOCLOPRAMIDE HCL 10 MG PO TABS
10.0000 mg | ORAL_TABLET | Freq: Once | ORAL | Status: AC
  Administered 2016-03-11: 10 mg via ORAL
  Filled 2016-03-11: qty 1

## 2016-03-11 MED ORDER — DIPHENHYDRAMINE HCL 50 MG/ML IJ SOLN
25.0000 mg | Freq: Once | INTRAMUSCULAR | Status: AC
  Administered 2016-03-11: 25 mg via INTRAVENOUS
  Filled 2016-03-11: qty 1

## 2016-03-11 MED ORDER — SODIUM CHLORIDE 0.9 % IV BOLUS (SEPSIS)
1000.0000 mL | Freq: Once | INTRAVENOUS | Status: AC
  Administered 2016-03-11: 1000 mL via INTRAVENOUS

## 2016-03-11 MED ORDER — DM-GUAIFENESIN ER 30-600 MG PO TB12
2.0000 | ORAL_TABLET | Freq: Once | ORAL | Status: AC
  Administered 2016-03-11: 2 via ORAL
  Filled 2016-03-11: qty 2

## 2016-03-11 MED ORDER — KETOROLAC TROMETHAMINE 30 MG/ML IJ SOLN
30.0000 mg | Freq: Once | INTRAMUSCULAR | Status: AC
  Administered 2016-03-11: 30 mg via INTRAVENOUS
  Filled 2016-03-11: qty 1

## 2016-03-11 NOTE — ED Notes (Signed)
Pt states she has been having chills, fever (99.3), body aches, and congestion for about 2 weeks now. Pt is currently being treated for a vaginal infection by her PCP.

## 2016-03-11 NOTE — ED Provider Notes (Signed)
[facility] DEPT Provider Note   CSN: [id number] Arrival date & time: 03/10/16  2026   By signing my name below, I, Eunice Blase, attest that this documentation has been prepared under the direction and in the presence of Everlene Balls, MD. Electronically signed, Eunice Blase, ED Scribe. 03/11/16. 3:30 AM.   History   Chief Complaint Chief Complaint  Patient presents with  . Generalized Body Aches    Fever/ Chills   . Cough  . Nasal Congestion   The history is provided by the patient and medical records. No language interpreter was used.    HPI Comments: Cindy Armstrong is a 26 y.o. female who presents to the Emergency Department complaining of generalized body aches x < 24 hours. She reports associated rhinorrhea, productive cough, intermittent subjective fever/chills and gradually improving nasal congestion x > 2 weeks. Pt notes she was seen at another practice recently for her symptoms where she received a flu vaccination with no relief to her symptoms. She notes sick contacts at home and at work.  Past Medical History:  Diagnosis Date  . Anemia     Patient Active Problem List   Diagnosis Date Noted  . Vaginal discharge 03/07/2016  . Breakthrough bleeding associated with intrauterine device (IUD) 03/07/2016    Past Surgical History:  Procedure Laterality Date  . Wisdom teeth removal (11/07/13) Bilateral     OB History    Gravida Para Term Preterm AB Living   2 2 0 2 0 2   SAB TAB Ectopic Multiple Live Births   0 0 0 0 2       Home Medications    Prior to Admission medications   Medication Sig Start Date End Date Taking? Authorizing Provider  metroNIDAZOLE (FLAGYL) 500 MG tablet Take 1 tablet (500 mg total) by mouth 2 (two) times daily. 03/07/16   Sela Hua, MD  norgestimate-ethinyl estradiol (ORTHO-CYCLEN,SPRINTEC,PREVIFEM) 0.25-35 MG-MCG tablet Take 1 tablet by mouth daily. 03/07/16   Sela Hua, MD    Family History Family History  Problem  Relation Age of Onset  . Diabetes Mother   . Drug abuse Mother   . Learning disabilities Brother   . Alcohol abuse Father   . Drug abuse Father   . Hypertension Maternal Aunt   . Arthritis Neg Hx   . Asthma Neg Hx   . Birth defects Neg Hx   . Cancer Neg Hx   . COPD Neg Hx   . Depression Neg Hx   . Early death Neg Hx   . Hearing loss Neg Hx   . Heart disease Neg Hx   . Hyperlipidemia Neg Hx   . Kidney disease Neg Hx   . Mental illness Neg Hx   . Mental retardation Neg Hx   . Miscarriages / Stillbirths Neg Hx   . Stroke Neg Hx   . Vision loss Neg Hx   . Other Neg Hx     Social History Social History  Substance Use Topics  . Smoking status: Former Smoker    Years: 3.00    Quit date: 2015  . Smokeless tobacco: Never Used  . Alcohol use No     Allergies   Patient has no known allergies.   Review of Systems Review of Systems  All other systems reviewed and are negative.  A complete 10 system review of systems was obtained and all systems are negative except as noted in the HPI and PMH.    Physical Exam Updated  Vital Signs BP 138/97 (BP Location: Right Arm)   Pulse 100   Temp 99.2 F (37.3 C) (Oral)   Resp 18   Ht 5\' 3"  (1.6 m)   Wt 120 lb (54.4 kg)   LMP 03/06/2016 Comment: Irregular  SpO2 99%   BMI 21.26 kg/m   Physical Exam  Constitutional: She is oriented to person, place, and time. She appears well-developed and well-nourished. No distress.  HENT:  Head: Normocephalic and atraumatic.  Nose: Nose normal.  Mouth/Throat: Oropharynx is clear and moist. No oropharyngeal exudate.  Eyes: Conjunctivae and EOM are normal. Pupils are equal, round, and reactive to light. No scleral icterus.  Neck: Normal range of motion. Neck supple. No JVD present. No tracheal deviation present. No thyromegaly present.  Cardiovascular: Regular rhythm and normal heart sounds.  Tachycardia present.  Exam reveals no gallop and no friction rub.   No murmur  heard. Pulmonary/Chest: Effort normal and breath sounds normal. No respiratory distress. She has no wheezes. She exhibits no tenderness.  Abdominal: Soft. Bowel sounds are normal. She exhibits no distension and no mass. There is no tenderness. There is no rebound and no guarding.  Musculoskeletal: Normal range of motion. She exhibits no edema or tenderness.  Lymphadenopathy:    She has no cervical adenopathy.  Neurological: She is alert and oriented to person, place, and time. No cranial nerve deficit. She exhibits normal muscle tone.  Skin: Skin is warm and dry. No rash noted. No erythema. No pallor.  +tactile fever  Nursing note and vitals reviewed.   ED Treatments / Results  DIAGNOSTIC STUDIES: Oxygen Saturation is 99% on RA, normal by my interpretation.    COORDINATION OF CARE: 3:20 AM Discussed treatment plan with pt at bedside and pt agreed to plan. Will order IV and decongestants.  Labs (all labs ordered are listed, but only abnormal results are displayed) Labs Reviewed  URINALYSIS, ROUTINE W REFLEX MICROSCOPIC - Abnormal; Notable for the following:       Result Value   Hgb urine dipstick SMALL (*)    Ketones, ur 20 (*)    Bacteria, UA RARE (*)    Squamous Epithelial / LPF 0-5 (*)    All other components within normal limits  CBC WITH DIFFERENTIAL/PLATELET - Abnormal; Notable for the following:    WBC 13.4 (*)    Neutro Abs 10.9 (*)    All other components within normal limits  BASIC METABOLIC PANEL - Abnormal; Notable for the following:    Creatinine, Ser 1.03 (*)    All other components within normal limits  I-STAT BETA HCG BLOOD, ED ([facility], [facility], [facility] ONLY)    EKG  EKG Interpretation None       Radiology Dg Chest 2 View  Result Date: 03/10/2016 CLINICAL DATA:  Fever, productive cough EXAM: CHEST  2 VIEW COMPARISON:  07/24/2015 FINDINGS: The heart size and mediastinal contours are within normal limits. Both lungs are clear. The visualized skeletal structures are  unremarkable. IMPRESSION: No active cardiopulmonary disease. Electronically Signed   By: Kathreen Devoid   On: 03/10/2016 21:18    Procedures Procedures (including critical care time)  Medications Ordered in ED Medications  acetaminophen (TYLENOL) tablet 650 mg (650 mg Oral Given 03/10/16 2041)  sodium chloride 0.9 % bolus 1,000 mL (1,000 mLs Intravenous New Bag/Given 03/11/16 0406)  ketorolac (TORADOL) 30 MG/ML injection 30 mg (30 mg Intravenous Given 03/11/16 0406)  dextromethorphan-guaiFENesin (MUCINEX DM) 30-600 MG per 12 hr tablet 2 tablet (2 tablets Oral Given  03/11/16 0354)  metoCLOPramide (REGLAN) tablet 10 mg (10 mg Oral Given 03/11/16 0354)  diphenhydrAMINE (BENADRYL) injection 25 mg (25 mg Intravenous Given 03/11/16 0406)     Initial Impression / Assessment and Plan / ED Course  I have reviewed the triage vital signs and the nursing notes.  Pertinent labs & imaging results that were available during my care of the patient were reviewed by me and considered in my medical decision making (see chart for details).      Patient presents to the ED for generalized body aches, fever, and URI symptoms.  She likely has a viral illness.  CXR negative for pnuemonia.  Will give IVF, toradol, mucinex, and reglan for her symptoms.  Tylenol has resolved her fever in the [place].  She was educated on her viral illness, need for hydration and PCP fu within 3 days.  She demonstrates good understanding. She appears well and in NAD.  VS remain within her normal limits and she is safe for DC.     I personally performed the services described in this documentation, which was scribed in my presence. The recorded information has been reviewed and is accurate.     Final Clinical Impressions(s) / ED Diagnoses   Final diagnoses:  None    New Prescriptions New Prescriptions   No medications on file     Everlene Balls, MD 03/11/16 [phone]

## 2016-11-13 ENCOUNTER — Encounter: Payer: Self-pay | Admitting: Internal Medicine

## 2016-11-13 ENCOUNTER — Ambulatory Visit (INDEPENDENT_AMBULATORY_CARE_PROVIDER_SITE_OTHER): Payer: Medicaid Other | Admitting: Internal Medicine

## 2016-11-13 ENCOUNTER — Other Ambulatory Visit (HOSPITAL_COMMUNITY)
Admission: RE | Admit: 2016-11-13 | Discharge: 2016-11-13 | Disposition: A | Discharge status: Home / Self Care | Payer: Medicaid Other | Source: Ambulatory Visit | Attending: Family Medicine | Admitting: Family Medicine

## 2016-11-13 VITALS — BP 100/60 | HR 73 | Temp 98.2°F | Wt 131.0 lb

## 2016-11-13 DIAGNOSIS — Z113 Encounter for screening for infections with a predominantly sexual mode of transmission: Secondary | ICD-10-CM | POA: Diagnosis not present

## 2016-11-13 DIAGNOSIS — Z309 Encounter for contraceptive management, unspecified: Secondary | ICD-10-CM | POA: Diagnosis present

## 2016-11-13 DIAGNOSIS — Z3042 Encounter for surveillance of injectable contraceptive: Secondary | ICD-10-CM | POA: Insufficient documentation

## 2016-11-13 DIAGNOSIS — Z124 Encounter for screening for malignant neoplasm of cervix: Secondary | ICD-10-CM | POA: Insufficient documentation

## 2016-11-13 DIAGNOSIS — A5909 Other urogenital trichomoniasis: Secondary | ICD-10-CM | POA: Insufficient documentation

## 2016-11-13 DIAGNOSIS — R87610 Atypical squamous cells of undetermined significance on cytologic smear of cervix (ASC-US): Secondary | ICD-10-CM | POA: Insufficient documentation

## 2016-11-13 DIAGNOSIS — Z975 Presence of (intrauterine) contraceptive device: Secondary | ICD-10-CM | POA: Insufficient documentation

## 2016-11-13 DIAGNOSIS — Z23 Encounter for immunization: Secondary | ICD-10-CM

## 2016-11-13 LAB — POCT URINE PREGNANCY: Preg Test, Ur: NEGATIVE

## 2016-11-13 NOTE — Patient Instructions (Signed)
It was so nice to see you!  Your IUD will come out in October 2020 (after 5 years).  We did a pap smear today and I have also ordered some labs. I will call you with these results.  -Dr. Brett Albino

## 2016-11-13 NOTE — Assessment & Plan Note (Signed)
-   Pap performed today

## 2016-11-13 NOTE — Progress Notes (Signed)
   [facility] Phone: [phone]  Subjective:  C[facility] Armstrong is a 26 year old female presenting to clinic to discuss birth control. She had an IUD placed in 2015 after the birth of her child. She subsequently had breakthrough bleeding where she was having periods for 3 weeks out of the month. She was seen in clinic 03/07/16 for this. She was treated with Sprintec x 2 months to help regular her cycle. The breakthrough bleeding has stopped. Her periods are lasting 6-7 days and occur once a month. She has light to moderate bleeding. She wants to know when she needs to have the IUD removed. She couldn't remember if it had to come out in 3 years or 5 years. She is also due for a pap smear. She has never had an abnormal pap before.  ROS: See HPI for pertinent positives and negatives  Past Medical History- none  Family history reviewed for today's visit. No changes.  Social history- patient is a former smoker, quit in 2015.  Objective: BP 100/60   Pulse 73   Temp 98.2 F (36.8 C) (Oral)   Wt 131 lb (59.4 kg)   LMP 10/30/2016   SpO2 99%   BMI 23.21 kg/m  Gen: NAD, alert, cooperative with exam GU: External genitalia normal in appearance. Vaginal walls normal. Scant amount of white vaginal discharge. Cervix normal in appearance. No adnexal or uterine masses appreciated on bimanual exam.  Assessment/Plan: IUD in place: Going well. No longer having breakthrough bleeding. - Plan to remove October 2020.  Encounter for cervical cancer screening: - Pap performed today  Screening for STD: - Gonorrhea, chlamydia, HIV, and RPR ordered   Hyman Bible, MD PGY-3

## 2016-11-13 NOTE — Assessment & Plan Note (Signed)
Going well. No longer having breakthrough bleeding. - Plan to remove October 2020.

## 2016-11-13 NOTE — Assessment & Plan Note (Signed)
-   Gonorrhea, chlamydia, HIV, and RPR ordered

## 2016-11-14 LAB — RPR: RPR Ser Ql: NONREACTIVE

## 2016-11-14 LAB — HIV ANTIBODY (ROUTINE TESTING W REFLEX): HIV Screen 4th Generation wRfx: NONREACTIVE

## 2016-11-19 ENCOUNTER — Telehealth: Payer: Self-pay | Admitting: Internal Medicine

## 2016-11-19 LAB — CYTOLOGY - PAP
CHLAMYDIA, DNA PROBE: NEGATIVE
Diagnosis: UNDETERMINED — AB
HPV: DETECTED — AB
NEISSERIA GONORRHEA: NEGATIVE
Trichomonas: POSITIVE — AB

## 2016-11-19 NOTE — Telephone Encounter (Signed)
Called patient to discuss pap results. She did not answer and I was unable to leave a voicemail. Her pap showed atypical cells, so she needs to have a colposcopy done. Her pap was also positive for Trichomonas and she will need to be treated with Flagyl. Will attempt to call patient tomorrow to let her know about these results.  Hyman Bible, MD PGY-3

## 2016-11-21 ENCOUNTER — Telehealth: Payer: Self-pay | Admitting: Internal Medicine

## 2016-11-21 NOTE — Telephone Encounter (Signed)
Have tried to call patient multiple times over the last couple of days to discuss her lab results. I have been unable to get in touch with her, but was able to leave a voicemail today asking her to call us back.  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 -She was positive for Trichomonas, so she needs to be treated with Flagyl 500mg  bid x 7 days.  I have sent a message to Dr. Emmaline Life about this, as she will be covering my box while I am out of town next week.  Hyman Bible, MD PGY-3

## 2016-11-23 ENCOUNTER — Telehealth: Payer: Self-pay | Admitting: Internal Medicine

## 2016-11-23 DIAGNOSIS — A599 Trichomoniasis, unspecified: Secondary | ICD-10-CM | POA: Insufficient documentation

## 2016-11-23 MED ORDER — METRONIDAZOLE 500 MG PO TABS: 2000.0000 mg | ORAL_TABLET | Freq: Once | ORAL | 0 refills | Status: AC

## 2016-11-23 NOTE — Telephone Encounter (Signed)
Left a message letting patient know that there are results I would like to discuss with her. Unable to get in contact with patient. Will place order for Flagyl. If patient does not respond to message or second call back. Will send a letter with results

## 2016-11-24 ENCOUNTER — Encounter: Payer: Self-pay | Admitting: *Deleted

## 2016-11-24 NOTE — Telephone Encounter (Signed)
Patient informed regarding her abnormal pap and also STD results.  No additional questions from patient and appointment scheduled for 10/181/ at 8:30am.  Jazmin Hartsell,CMA

## 2016-11-25 ENCOUNTER — Telehealth: Payer: Self-pay | Admitting: Internal Medicine

## 2016-11-25 NOTE — Telephone Encounter (Signed)
Patient had called the clinic back yesterday and had been told already about trichomonas and the need for her to have colposcopy. Patient has an appointment for this Thursday.

## 2016-11-27 ENCOUNTER — Ambulatory Visit (INDEPENDENT_AMBULATORY_CARE_PROVIDER_SITE_OTHER): Payer: Self-pay | Admitting: Family Medicine

## 2016-11-27 VITALS — BP 118/64 | HR 77 | Temp 98.5°F | Wt 130.0 lb

## 2016-11-27 DIAGNOSIS — R87619 Unspecified abnormal cytological findings in specimens from cervix uteri: Secondary | ICD-10-CM

## 2016-11-27 DIAGNOSIS — R87610 Atypical squamous cells of undetermined significance on cytologic smear of cervix (ASC-US): Secondary | ICD-10-CM

## 2016-11-27 DIAGNOSIS — K922 Gastrointestinal hemorrhage, unspecified: Secondary | ICD-10-CM

## 2016-11-27 DIAGNOSIS — R87810 Cervical high risk human papillomavirus (HPV) DNA test positive: Secondary | ICD-10-CM

## 2016-11-27 DIAGNOSIS — Z30431 Encounter for routine checking of intrauterine contraceptive device: Secondary | ICD-10-CM

## 2016-11-27 LAB — POCT URINE PREGNANCY: Preg Test, Ur: NEGATIVE

## 2016-11-27 NOTE — Progress Notes (Signed)
Patient ID: Cindy Armstrong, female   DOB: 06-27-1990, 26 y.o.   MRN: [id number]  Chief Complaint  Patient presents with  . Gynecologic Exam    Colposcopy    HPI Cindy BOERNER is a 26 y.o. female.  Here for colposcopy due to abnormal PAP result. She also mentioned she noticed blood in her stool yesterday. She has hx of hemorrhoid. She is currently having her period.  Marland KitchenHPI  Indications: Pap smear on October 2018 showed: ASCUS with POSITIVE high risk HPV. Previous colposcopy: NA. Prior cervical treatment: no treatment.  Past Medical History:  Diagnosis Date  . Anemia     Past Surgical History:  Procedure Laterality Date  . Wisdom teeth removal (11/07/13) Bilateral     Family History  Problem Relation Age of Onset  . Diabetes Mother   . Drug abuse Mother   . Learning disabilities Brother   . Alcohol abuse Father   . Drug abuse Father   . Hypertension Maternal Aunt   . Arthritis Neg Hx   . Asthma Neg Hx   . Birth defects Neg Hx   . Cancer Neg Hx   . COPD Neg Hx   . Depression Neg Hx   . Early death Neg Hx   . Hearing loss Neg Hx   . Heart disease Neg Hx   . Hyperlipidemia Neg Hx   . Kidney disease Neg Hx   . Mental illness Neg Hx   . Mental retardation Neg Hx   . Miscarriages / Stillbirths Neg Hx   . Stroke Neg Hx   . Vision loss Neg Hx   . Other Neg Hx     Social History Social History  Substance Use Topics  . Smoking status: Former Smoker    Years: 3.00    Quit date: 2015  . Smokeless tobacco: Never Used  . Alcohol use No    No Known Allergies  No current outpatient prescriptions on file.   No current facility-administered medications for this visit.     Review of Systems Review of Systems  Respiratory: Negative.   Cardiovascular: Negative.   Gastrointestinal: Positive for blood in stool. Negative for abdominal pain, constipation, diarrhea, nausea and vomiting.  All other systems reviewed and are negative.   Blood pressure 118/64, pulse  77, temperature 98.5 F (36.9 C), temperature source Oral, weight 130 lb (59 kg), last menstrual period 11/26/2016, SpO2 99 %.  Physical Exam Physical Exam  Constitutional: She appears well-developed. No distress.  Genitourinary: Uterus normal. There is no tenderness on the right labia. There is no tenderness on the left labia. Cervix exhibits no discharge and no friability. There is bleeding in the vagina. No vaginal discharge found.    Genitourinary Comments: No blood noticed around her anus. She does have blood coming from her vagina and cervical os from menses  Nursing note and vitals reviewed.   Data Reviewed 11/27/16  Assessment    Procedure Details  The risks and benefits of the procedure and Written informed consent obtained.  Speculum placed in vagina and excellent visualization of cervix achieved, cervix swabbed x 3 with acetic acid solution.  Specimens: ECC and Cervical biopsy at 9 o'clock and 2 O'clock  Complications: none.     Plan   ECC and Cervical biopsy done today. Specimens labelled and sent to Pathology. Return to discuss Pathology results in 2 weeks.  GI bleed: Likely blood stained stool is secondary to her menses rather than actual GI bleed. I recommended PCP  follow-up for reassessment once her period stops and if symptoms persist. She agreed with plan.  Yellow plastic material from cervical os. Looks like her IUD might be expelling. I recommended pelvic US for IUD check, if not seen in utero she will need f/u with PCp for removal and reinsertion of her IUD. She agreed with the plan and verbalized understanding.      Cindy Armstrong 11/27/2016, 8:43 AM

## 2016-11-27 NOTE — Patient Instructions (Signed)
It was nice seeing you today. We took some biopsy of your cervix. I will call you with result. We have scheduled an [place] to check your IUD. If it is displaced you will need to come back for removal and replacement with your PCP. I was unable to see any bleeding from your anus. It could be coming from your menses. Please monitor and see PCP soon if it persists after your period stops.   Colposcopy, Care After This sheet gives you information about how to care for yourself after your procedure. Your doctor may also give you more specific instructions. If you have problems or questions, contact your doctor. What can I expect after the procedure? If you did not have a tissue sample removed (did not have a biopsy), you may only have some spotting for a few days. You can go back to your normal activities. If you had a tissue sample removed, it is common to have:  Soreness and pain. This may last for a few days.  Light-headedness.  Mild bleeding from your vagina or dark-colored, grainy discharge from your vagina. This may last for a few days. You may need to wear a sanitary pad.  Spotting for at least 48 hours after the procedure.  Follow these instructions at home:  Take over-the-counter and prescription medicines only as told by your doctor. Ask your doctor what medicines you can start taking again. This is very important if you take blood-thinning medicine.  Do not drive or use heavy machinery while taking prescription pain medicine.  For 3 days, or as long as your doctor tells you, avoid: ? Douching. ? Using tampons. ? Having sex.  If you use birth control (contraception), keep using it.  Limit activity for the first day after the procedure. Ask your doctor what activities are safe for you.  It is up to you to get the results of your procedure. Ask your doctor when your results will be ready.  Keep all follow-up visits as told by your doctor. This is important. Contact a doctor  if:  You get a skin rash. Get help right away if:  You are bleeding a lot from your vagina. It is a lot of bleeding if you are using more than one pad an hour for 2 hours in a row.  You have clumps of blood (blood clots) coming from your vagina.  You have a fever.  You have chills  You have pain in your lower belly (pelvic area).  You have signs of infection, such as vaginal discharge that is: ? Different than usual. ? Yellow. ? Bad-smelling.  You have very pain or cramps in your lower belly that do not get better with medicine.  You feel light-headed.  You feel dizzy.  You pass out (faint). Summary  If you did not have a tissue sample removed (did not have a biopsy), you may only have some spotting for a few days. You can go back to your normal activities.  If you had a tissue sample removed, it is common to have mild pain and spotting for 48 hours.  For 3 days, or as long as your doctor tells you, avoid douching, using tampons and having sex.  Get help right away if you have bleeding, very bad pain, or signs of infection. This information is not intended to replace advice given to you by your health care provider. Make sure you discuss any questions you have with your health care provider. Document Released: 07/16/2007 Document  Revised: 10/17/2015 Document Reviewed: 10/17/2015 Elsevier Interactive Patient Education  [employer].

## 2016-12-01 ENCOUNTER — Telehealth: Payer: Self-pay | Admitting: Family Medicine

## 2016-12-01 DIAGNOSIS — N871 Moderate cervical dysplasia: Secondary | ICD-10-CM

## 2016-12-01 NOTE — Telephone Encounter (Signed)
Colpo result discussed. Diagnostic excisional procedure recommended. I referred her to [facility]. All questions were answered. She agreed with plan and referral.

## 2016-12-03 ENCOUNTER — Ambulatory Visit (HOSPITAL_COMMUNITY)
Admission: RE | Admit: 2016-12-03 | Discharge: 2016-12-03 | Disposition: A | Discharge status: Home / Self Care | Payer: Medicaid Other | Source: Ambulatory Visit | Attending: Family Medicine | Admitting: Family Medicine

## 2016-12-03 ENCOUNTER — Telehealth: Payer: Self-pay | Admitting: Family Medicine

## 2016-12-03 DIAGNOSIS — Z30431 Encounter for routine checking of intrauterine contraceptive device: Secondary | ICD-10-CM | POA: Insufficient documentation

## 2016-12-03 NOTE — Telephone Encounter (Signed)
Pt called back.  Was informed of results. Cindy Armstrong, [name], [name]

## 2016-12-03 NOTE — Telephone Encounter (Addendum)
HIPPA compliant message left for her to call back.  Whenever she calls back, please inform her that per the pelvic US result, her IUD is indeed displaced pretty close to her cervix. She has appointment scheduled with her PCP for removal and replacement or she can be referred to [facility] clinic for this procedure.

## 2016-12-09 ENCOUNTER — Ambulatory Visit: Payer: Medicaid Other | Admitting: Internal Medicine

## 2016-12-24 ENCOUNTER — Ambulatory Visit: Payer: Medicaid Other | Admitting: Internal Medicine

## 2016-12-25 ENCOUNTER — Encounter: Payer: Self-pay | Admitting: Obstetrics & Gynecology

## 2017-01-16 ENCOUNTER — Ambulatory Visit: Payer: Medicaid Other | Admitting: Obstetrics & Gynecology

## 2017-01-16 ENCOUNTER — Encounter: Payer: Self-pay | Admitting: *Deleted

## 2017-01-16 ENCOUNTER — Telehealth: Payer: Self-pay | Admitting: *Deleted

## 2017-01-16 NOTE — Telephone Encounter (Signed)
Cindy Armstrong did not keep her scheduled appointment for colposcopy, I called her and left a message notifying her and to call for rescheduling. Will also send letter.

## 2017-07-28 ENCOUNTER — Emergency Department (HOSPITAL_COMMUNITY)
Admission: EM | Admit: 2017-07-28 | Discharge: 2017-07-28 | Disposition: A | Discharge status: Home / Self Care | Payer: Medicaid Other | Attending: Emergency Medicine | Admitting: Emergency Medicine

## 2017-07-28 ENCOUNTER — Encounter (HOSPITAL_COMMUNITY): Payer: Self-pay | Admitting: Emergency Medicine

## 2017-07-28 DIAGNOSIS — R109 Unspecified abdominal pain: Secondary | ICD-10-CM | POA: Insufficient documentation

## 2017-07-28 DIAGNOSIS — R300 Dysuria: Secondary | ICD-10-CM | POA: Insufficient documentation

## 2017-07-28 DIAGNOSIS — Z79899 Other long term (current) drug therapy: Secondary | ICD-10-CM | POA: Insufficient documentation

## 2017-07-28 DIAGNOSIS — Z87891 Personal history of nicotine dependence: Secondary | ICD-10-CM | POA: Insufficient documentation

## 2017-07-28 LAB — BASIC METABOLIC PANEL
Anion gap: 8 (ref 5–15)
BUN: 10 mg/dL (ref 6–20)
CALCIUM: 9.4 mg/dL (ref 8.9–10.3)
CHLORIDE: 106 mmol/L (ref 101–111)
CO2: 25 mmol/L (ref 22–32)
CREATININE: 0.9 mg/dL (ref 0.44–1.00)
GFR calc non Af Amer: >60 mL/min (ref >60)
GLUCOSE: 108 mg/dL — AB (ref 65–99)
Potassium: 3.6 mmol/L (ref 3.5–5.1)
Sodium: 139 mmol/L (ref 135–145)

## 2017-07-28 LAB — URINALYSIS, ROUTINE W REFLEX MICROSCOPIC
BILIRUBIN URINE: NEGATIVE
Glucose, UA: NEGATIVE mg/dL
HGB URINE DIPSTICK: NEGATIVE
Ketones, ur: 20 mg/dL — AB
Nitrite: POSITIVE — AB
Protein, ur: 30 mg/dL — AB
Specific Gravity, Urine: 1.024 (ref 1.005–1.030)
WBC, UA: >50 WBC/hpf — ABNORMAL HIGH (ref 0–5)
pH: 6 (ref 5.0–8.0)

## 2017-07-28 LAB — CBC
HCT: 40.5 % (ref 36.0–46.0)
Hemoglobin: 13.2 g/dL (ref 12.0–15.0)
MCH: 29.3 pg (ref 26.0–34.0)
MCHC: 32.6 g/dL (ref 30.0–36.0)
MCV: 90 fL (ref 78.0–100.0)
PLATELETS: 170 10*3/uL (ref 150–400)
RBC: 4.5 MIL/uL (ref 3.87–5.11)
RDW: 15.3 % (ref 11.5–15.5)
WBC: 14.9 10*3/uL — ABNORMAL HIGH (ref 4.0–10.5)

## 2017-07-28 LAB — I-STAT BETA HCG BLOOD, ED (MC, WL, AP ONLY)

## 2017-07-28 MED ORDER — SODIUM CHLORIDE 0.9 % IV SOLN
1.0000 g | Freq: Once | INTRAVENOUS | Status: AC
  Administered 2017-07-28: 1 g via INTRAVENOUS
  Filled 2017-07-28: qty 10

## 2017-07-28 MED ORDER — CEPHALEXIN 500 MG PO CAPS: 500.0000 mg | ORAL_CAPSULE | Freq: Four times a day (QID) | ORAL | 0 refills | Status: DC

## 2017-07-28 MED ORDER — SODIUM CHLORIDE 0.9 % IV BOLUS
1000.0000 mL | Freq: Once | INTRAVENOUS | Status: AC
  Administered 2017-07-28: 1000 mL via INTRAVENOUS

## 2017-07-28 NOTE — ED Notes (Signed)
ED Provider at bedside. 

## 2017-07-28 NOTE — Discharge Instructions (Addendum)
Return if any problems.  See your Physician for recheck in 2-3 days °

## 2017-07-28 NOTE — ED Provider Notes (Signed)
[facility] EMERGENCY DEPARTMENT Provider Note   CSN: [id number] Arrival date & time: 07/28/17  6144     History   Chief Complaint Chief Complaint  Patient presents with  . Flank Pain    HPI Cindy Armstrong is a 27 y.o. female.  The history is provided by the patient. No language interpreter was used.  Flank Pain  This is a new problem. The problem occurs constantly. The problem has not changed since onset.Pertinent negatives include no chest pain. Nothing aggravates the symptoms. Nothing relieves the symptoms. She has tried nothing for the symptoms. The treatment provided no relief.  Pt complains of burning with urination and left flank pain.    Past Medical History:  Diagnosis Date  . Anemia     Patient Active Problem List   Diagnosis Date Noted  . Trichomonas vaginalis infection 11/23/2016  . IUD (intrauterine device) in place 11/13/2016  . Screening for cervical cancer 11/13/2016  . Screening examination for STD (sexually transmitted disease) 11/13/2016    Past Surgical History:  Procedure Laterality Date  . Wisdom teeth removal (11/07/13) Bilateral      OB History    Gravida  2   Para  2   Term  0   Preterm  2   AB  0   Living  2     SAB  0   TAB  0   Ectopic  0   Multiple  0   Live Births  2            Home Medications    Prior to Admission medications   Medication Sig Start Date End Date Taking? Authorizing Provider  levonorgestrel (MIRENA) 20 MCG/24HR IUD 1 each by Intrauterine route once.   Yes [provider]  cephALEXin (KEFLEX) 500 MG capsule Take 1 capsule (500 mg total) by mouth 4 (four) times daily. 07/28/17   Fransico Meadow, PA-C    Family History Family History  Problem Relation Age of Onset  . Diabetes Mother   . Drug abuse Mother   . Learning disabilities Brother   . Alcohol abuse Father   . Drug abuse Father   . Hypertension Maternal Aunt   . Arthritis Neg Hx   . Asthma Neg Hx     . Birth defects Neg Hx   . Cancer Neg Hx   . COPD Neg Hx   . Depression Neg Hx   . Early death Neg Hx   . Hearing loss Neg Hx   . Heart disease Neg Hx   . Hyperlipidemia Neg Hx   . Kidney disease Neg Hx   . Mental illness Neg Hx   . Mental retardation Neg Hx   . Miscarriages / Stillbirths Neg Hx   . Stroke Neg Hx   . Vision loss Neg Hx   . Other Neg Hx     Social History Social History   Tobacco Use  . Smoking status: Former Smoker    Years: 3.00    Last attempt to quit: 2015    Years since quitting: 4.4  . Smokeless tobacco: Never Used  Substance Use Topics  . Alcohol use: No  . Drug use: No     Allergies   Patient has no known allergies.   Review of Systems Review of Systems  Cardiovascular: Negative for chest pain.  Genitourinary: Positive for flank pain.  All other systems reviewed and are negative.    Physical Exam Updated Vital Signs BP  123/70 (BP Location: Right Arm)   Pulse 92   Temp 98.3 F (36.8 C) (Oral)   Resp 16   LMP 07/12/2017   SpO2 100%   Physical Exam  Constitutional: She appears well-developed and well-nourished.  HENT:  Head: Normocephalic.  Right Ear: External ear normal.  Left Ear: External ear normal.  Nose: Nose normal.  Mouth/Throat: Oropharynx is clear and moist.  Eyes: Pupils are equal, round, and reactive to light.  Cardiovascular: Normal rate.  Pulmonary/Chest: Effort normal.  Abdominal: Soft.  Musculoskeletal: Normal range of motion.  Neurological: She is alert.  Skin: Skin is warm.  Psychiatric: She has a normal mood and affect.  Nursing note and vitals reviewed.    ED Treatments / Results  Labs (all labs ordered are listed, but only abnormal results are displayed) Labs Reviewed  URINALYSIS, ROUTINE W REFLEX MICROSCOPIC - Abnormal; Notable for the following components:      Result Value   APPearance HAZY (*)    Ketones, ur 20 (*)    Protein, ur 30 (*)    Nitrite POSITIVE (*)    Leukocytes, UA TRACE  (*)    WBC, UA >50 (*)    Bacteria, UA MANY (*)    Non Squamous Epithelial 0-5 (*)    All other components within normal limits  BASIC METABOLIC PANEL - Abnormal; Notable for the following components:   Glucose, Bld 108 (*)    All other components within normal limits  CBC - Abnormal; Notable for the following components:   WBC 14.9 (*)    All other components within normal limits  I-STAT BETA HCG BLOOD, ED ([facility], [facility], [facility] ONLY)    EKG None  Radiology No results found.  Procedures Procedures (including critical care time)  Medications Ordered in ED Medications  sodium chloride 0.9 % bolus 1,000 mL (0 mLs Intravenous Stopped 07/28/17 1109)  cefTRIAXone (ROCEPHIN) 1 g in sodium chloride 0.9 % 100 mL IVPB (0 g Intravenous Stopped 07/28/17 1301)     Initial Impression / Assessment and Plan / ED Course  I have reviewed the triage vital signs and the nursing notes.  Pertinent labs & imaging results that were available during my care of the patient were reviewed by me and considered in my medical decision making (see chart for details).     MDM  Urine shows bacteria and wbc's.   Pt given Iv fluids x 1 liter. Rocephin 1 gram.  Pt given rx for keflex.  Pt advised to return if any problems.   Final Clinical Impressions(s) / ED Diagnoses   Final diagnoses:  Left flank pain   Current Meds  Medication Sig  . levonorgestrel (MIRENA) 20 MCG/24HR IUD 1 each by Intrauterine route once.   An After Visit Summary was printed and given to the patient.   ED Discharge Orders        Ordered    cephALEXin (KEFLEX) 500 MG capsule  4 times daily     07/28/17 1414       Sidney Ace 07/28/17 1521    Carmin Muskrat, MD 07/29/17 Bosie Helper

## 2017-07-28 NOTE — ED Triage Notes (Signed)
PT to BR to collect UA

## 2017-07-28 NOTE — ED Triage Notes (Signed)
Pt complains of bilateral flank pain- into back. Pt states she has had this pain before with kidney/bladder infection. Painful urination/foul smelling, dark colored urine.

## 2018-09-07 ENCOUNTER — Encounter (HOSPITAL_COMMUNITY): Payer: Self-pay | Admitting: Emergency Medicine

## 2018-09-07 ENCOUNTER — Other Ambulatory Visit: Payer: Self-pay

## 2018-09-07 ENCOUNTER — Ambulatory Visit (HOSPITAL_COMMUNITY)
Admission: EM | Admit: 2018-09-07 | Discharge: 2018-09-07 | Disposition: A | Discharge status: Home / Self Care | Payer: BC Managed Care – PPO | Attending: Urgent Care | Admitting: Urgent Care

## 2018-09-07 DIAGNOSIS — M940 Chondrocostal junction syndrome [Tietze]: Secondary | ICD-10-CM

## 2018-09-07 DIAGNOSIS — R0789 Other chest pain: Secondary | ICD-10-CM

## 2018-09-07 MED ORDER — MELOXICAM 7.5 MG PO TABS: 7.5000 mg | ORAL_TABLET | Freq: Every day | ORAL | 0 refills | Status: DC

## 2018-09-07 MED ORDER — CYCLOBENZAPRINE HCL 5 MG PO TABS: 5.0000 mg | ORAL_TABLET | Freq: Every evening | ORAL | 0 refills | Status: DC | PRN

## 2018-09-07 NOTE — ED Triage Notes (Signed)
Patient reports a 2 day history of rib cage soreness.  Soreness radiates into both sides of neck.  No known injury

## 2018-09-07 NOTE — ED Provider Notes (Signed)
MRN: [id number] DOB: 08-31-1990  Subjective:   Cindy Armstrong is a 28 y.o. female presenting for 4 day history of acute onset intermittent persistent bilateral chest/rib pain that is like a soreness worse with coughing, laughing and taking a deep breath. Has not tried any medications for her sx. Works as a Sports coach at [employer]. Denies COVID 19 contacts. Denies smoking cigarettes. Hydrates with ~2 bottles of water.    No current facility-administered medications for this encounter.   Current Outpatient Medications:  .  levonorgestrel (MIRENA) 20 MCG/24HR IUD, 1 each by Intrauterine route once., Disp: , Rfl:     No Known Allergies   Past Medical History:  Diagnosis Date  . Anemia      Past Surgical History:  Procedure Laterality Date  . Wisdom teeth removal (11/07/13) Bilateral     Review of Systems  Constitutional: Negative for fever and malaise/fatigue.  HENT: Negative for congestion, ear pain, sinus pain and sore throat.   Eyes: Negative for blurred vision, double vision, discharge and redness.  Respiratory: Negative for cough, hemoptysis, shortness of breath and wheezing.   Cardiovascular: Positive for chest pain.  Gastrointestinal: Negative for abdominal pain, diarrhea, nausea and vomiting.  Genitourinary: Negative for dysuria, flank pain and hematuria.  Musculoskeletal: Positive for neck pain (tension type on both sides). Negative for myalgias.  Skin: Negative for rash.  Neurological: Negative for dizziness, weakness and headaches.  Psychiatric/Behavioral: Negative for depression and substance abuse.    Objective:   Vitals: BP 131/78 (BP Location: Left Arm)   Pulse 80   Temp 98.1 F (36.7 C) (Temporal)   Resp 16   LMP 09/06/2018   SpO2 100%   Physical Exam Constitutional:      General: She is not in acute distress.    Appearance: Normal appearance. She is well-developed. She is not ill-appearing, toxic-appearing or diaphoretic.  HENT:     Head: Normocephalic  and atraumatic.     Nose: Nose normal.     Mouth/Throat:     Mouth: Mucous membranes are moist.  Eyes:     Extraocular Movements: Extraocular movements intact.     Pupils: Pupils are equal, round, and reactive to light.  Cardiovascular:     Rate and Rhythm: Normal rate and regular rhythm.     Pulses: Normal pulses.     Heart sounds: Normal heart sounds. No murmur. No friction rub. No gallop.   Pulmonary:     Effort: Pulmonary effort is normal. No respiratory distress.     Breath sounds: Normal breath sounds. No stridor. No wheezing, rhonchi or rales.  Chest:     Chest wall: Tenderness present. No mass, deformity, swelling, crepitus or edema.    Skin:    General: Skin is warm and dry.     Findings: No rash.  Neurological:     Mental Status: She is alert and oriented to person, place, and time.  Psychiatric:        Mood and Affect: Mood normal.        Behavior: Behavior normal.        Thought Content: Thought content normal.        Judgment: Judgment normal.     Assessment and Plan :   1. Atypical chest pain   2. Chest wall pain   3. Costochondritis     We will manage conservatively for musculoskeletal type pain, costochondritis of unknown etiology but may be related to mild dehydration and her work.  Counseled on use of NSAID,  muscle relaxant and modification of physical activity.  Anticipatory guidance provided.  Counseled patient on potential for adverse effects with medications prescribed/recommended today, ER and return-to-clinic precautions discussed, patient verbalized understanding.    Jaynee Eagles, [place] 09/07/18 1857

## 2018-12-16 ENCOUNTER — Other Ambulatory Visit: Payer: Self-pay

## 2018-12-16 ENCOUNTER — Ambulatory Visit (INDEPENDENT_AMBULATORY_CARE_PROVIDER_SITE_OTHER): Payer: BC Managed Care – PPO | Admitting: Family Medicine

## 2018-12-16 VITALS — BP 102/60 | HR 82 | Temp 98.3°F | Wt 140.0 lb

## 2018-12-16 DIAGNOSIS — Z30433 Encounter for removal and reinsertion of intrauterine contraceptive device: Secondary | ICD-10-CM

## 2018-12-16 DIAGNOSIS — R87613 High grade squamous intraepithelial lesion on cytologic smear of cervix (HGSIL): Secondary | ICD-10-CM

## 2018-12-16 MED ORDER — LEVONORGESTREL 20 MCG/24HR IU IUD
1.0000 | INTRAUTERINE_SYSTEM | Freq: Once | INTRAUTERINE | Status: AC
  Administered 2018-12-16: 1 via INTRAUTERINE

## 2018-12-16 NOTE — Progress Notes (Signed)
GYNECOLOGY CLINIC PROCEDURE NOTE  Cindy Armstrong is a 28 y.o. [phone] here for Mirena IUD insertion and removal. No GYN concerns.  Last pap smear was on October 2018 and was ASCUS with POSITIVE high risk HPV. Pt was lost to follow up and did not attend colposcopy clinic for further work up. Pt has been referred to Gynecology clinic for further follow up.   IUD Insertion Procedure Note Patient identified, informed consent performed.  Discussed risks of irregular bleeding, cramping, infection, malpositioning or misplacement of the IUD outside the uterus which may require further procedure such as laparoscopy. Time out was performed.  Urine pregnancy test negative.  Speculum placed in the vagina.  Cervix visualized.  Cleaned with Betadine x 2.  Grasped anteriorly with a single tooth tenaculum.  Uterus sounded to 7 cm.  Mirena IUD placed per manufacturer's recommendations. Strings trimmed to 3cm. Tenaculum was removed, good hemostasis noted.  Patient tolerated procedure well.   Patient was given post-procedure instructions.  She was advised to be have backup contraception for one week.  Patient was also asked to check IUD strings periodically and follow up in 2-4 weeks for IUD check.

## 2018-12-16 NOTE — Addendum Note (Signed)
Addended by: Christen Bame D on: 12/16/2018 05:36 PM   Modules accepted: Orders

## 2018-12-16 NOTE — Patient Instructions (Addendum)
IUD PLACEMENT POST-PROCEDURE INSTRUCTIONS  1. You may take Ibuprofen, Aleve or Tylenol for pain if needed.  Cramping should resolve within in 24 hours.  2. You may have a small amount of spotting.  You should wear a mini pad for the next few days.  3. You may have intercourse after 24 hours.  If you using this for birth control, it is effective immediately.  4. You need to call if you have any pelvic pain, fever, heavy bleeding or foul smelling vaginal discharge.  Irregular bleeding is common the first several months after having an IUD placed. You do not need to call for this reason unless you are concerned.  5. Shower or bathe as normal  6. You should have a follow-up appointment in 4-8 weeks for a re-check to make sure you are not having any problems.  Come back in 4 weeks for a string check to make sure your IUD is in the right place.  I have placed a referral to [facility] for your history of abnormal pap.  If you do not hear from them in the next 2 weeks, please give [place] a call.

## 2018-12-16 NOTE — Progress Notes (Signed)
GYNECOLOGY CLINIC PROCEDURE NOTE  Cindy Armstrong is a 28 y.o. [phone] here for Mirena IUD removal and insertion. No GYN concerns.  Last pap smear was on 11/27/2016 and was abnormal.  IUD Insertion Procedure Note Patient identified, informed consent performed.  Discussed risks of irregular bleeding, cramping, infection, malpositioning or misplacement of the IUD outside the uterus which may require further procedure such as laparoscopy. Time out was performed.  Urine pregnancy test negative.  Speculum placed in the vagina.  Cervix visualized.  Cleaned with Betadine x 2.  Grasped anteriorly with a single tooth tenaculum.  Uterus sounded to 7 cm.  Mirena IUD placed per manufacturer's recommendations.  Strings trimmed to 3 cm. Tenaculum was removed, good hemostasis noted.  Patient tolerated procedure well.   Patient was given post-procedure instructions.  She was advised to be have backup contraception for one week.  Patient was also asked to check IUD strings periodically and follow up in 4 weeks for IUD check.

## 2019-01-13 ENCOUNTER — Ambulatory Visit (INDEPENDENT_AMBULATORY_CARE_PROVIDER_SITE_OTHER): Payer: BC Managed Care – PPO | Admitting: Family Medicine

## 2019-01-13 ENCOUNTER — Encounter: Payer: Self-pay | Admitting: Family Medicine

## 2019-01-13 ENCOUNTER — Other Ambulatory Visit: Payer: Self-pay

## 2019-01-13 VITALS — BP 102/60 | HR 97 | Ht 63.0 in | Wt 149.2 lb

## 2019-01-13 DIAGNOSIS — Z975 Presence of (intrauterine) contraceptive device: Secondary | ICD-10-CM | POA: Diagnosis not present

## 2019-01-13 DIAGNOSIS — M940 Chondrocostal junction syndrome [Tietze]: Secondary | ICD-10-CM

## 2019-01-13 DIAGNOSIS — Z113 Encounter for screening for infections with a predominantly sexual mode of transmission: Secondary | ICD-10-CM | POA: Diagnosis not present

## 2019-01-13 NOTE — Progress Notes (Signed)
    Subjective:  Cindy Armstrong is a 28 y.o. female who presents to the [facility] today with a chief complaint of need to follow up.   HPI:  Cindy Armstrong here for follow up for IUD string check and breast pain.   IUD string check IUD placed about 3-4 weeks ago.  Patient does not feel IUD strings. Denies vaginal irritation, vaginal bleeding, vaginal discharge, pelvic pain, headaches and nausea. LMP 01/03/19, lasts 5-7days, occurs monthly   Breast pain Insidious onset of lateral lower chest intermittent sharp pains since July.  Pan lasts 1-2 minutes. Patient reports her breasts recently raised in size and she does not wear a bra. Has not performed self-breast exam recently.  Took Tylenol with some relief. No personal or familial hx of breast cancer. Denies chest pain, shortness of breath, nausea, vomiting, cough, palpitations and hemoptysis.      [place], medications and smoking status reviewed.   ROS: See HPI      Objective:  Physical Exam: BP 102/60   Pulse 97   Ht 5\' 3"  (1.6 m)   Wt 67.7 kg   LMP 01/10/2019   SpO2 98%   BMI 26.44 kg/m     GEN: well nourished, well developed female in no acute distress CV: regular rate and rhythm, no murmurs appreciated Chest: Costochondral cartilage tenderness, no breast masses or tenderness RESP: no increased work of breathing, clear to ascultation bilaterally with no crackles, wheezes, or rhonchi  ABD: Bowel sounds active, soft, nontender, non-distended Pelvic exam: normal external genitalia, vulva, vagina, uterus and adnexa, CERVIX: cervical discharge present - mucoid,  MSK: no lower edema, or cyanosis noted SKIN: warm, dry, no breast changes, no dimpling NEURO: moves all extremities appropriately    No results found for this or any previous visit (from the past 72 hour(s)).   Assessment/Plan:  IUD (intrauterine device) in place IUD placed 12/16/2018.  Strings visible on exam.  Acute costochondritis - 400 mg ibuprofen  taken with food every 8 hours as needed -If pain worsening or not improving follow-up in clinic -Wear bra daily for support -Advised patient to seek emergent care if chest pain and/or shortness of breath with exertional component or is prolonged and acutely than patient's baseline    No orders of the defined types were placed in this encounter.   No orders of the defined types were placed in this encounter.   Health Maintenance reviewed -refused influenza.  Lyndee Hensen, DO PGY-1, [facility] 01/13/2019 2:33 PM

## 2019-01-13 NOTE — Patient Instructions (Addendum)
It was great seeing you today!   I'd like to see you back for your regularly scheduled yearly appointments but if you need to be seen earlier than that for any new issues we're happy to fit you in, just give [place] a call!   If you have questions or concerns please do not hesitate to call at [phone].   - For your breast pain. Take 400 mg ibuprofen every 6 hours as needed and wear a bra for the next month to see if pain improves.  - Please follow up with Gynecology regarding your previous test results; call [phone] [facility] [place] [facility]). A referral has been placed. Please call to schedule an appointment.    Dr. Rushie [facility]

## 2019-01-18 DIAGNOSIS — M940 Chondrocostal junction syndrome [Tietze]: Secondary | ICD-10-CM | POA: Insufficient documentation

## 2019-01-18 NOTE — Assessment & Plan Note (Signed)
-   400 mg ibuprofen taken with food every 8 hours as needed -If pain worsening or not improving follow-up in clinic -Wear bra daily for support -Advised patient to seek emergent care if chest pain and/or shortness of breath with exertional component or is prolonged and acutely than patient's baseline

## 2019-01-18 NOTE — Assessment & Plan Note (Signed)
IUD placed 12/16/2018.  Strings visible on exam.

## 2019-04-06 ENCOUNTER — Ambulatory Visit (HOSPITAL_COMMUNITY)
Admission: EM | Admit: 2019-04-06 | Discharge: 2019-04-06 | Disposition: A | Discharge status: Home / Self Care | Payer: BC Managed Care – PPO | Attending: Family Medicine | Admitting: Family Medicine

## 2019-04-06 ENCOUNTER — Encounter (HOSPITAL_COMMUNITY): Payer: Self-pay

## 2019-04-06 ENCOUNTER — Other Ambulatory Visit: Payer: Self-pay

## 2019-04-06 DIAGNOSIS — K644 Residual hemorrhoidal skin tags: Secondary | ICD-10-CM

## 2019-04-06 MED ORDER — HYDROCORTISONE (PERIANAL) 2.5 % EX CREA: 1.0000 "application " | TOPICAL_CREAM | Freq: Two times a day (BID) | CUTANEOUS | 0 refills | Status: DC

## 2019-04-06 NOTE — ED Triage Notes (Signed)
Pt states she has hemorrhoids  Issue x 3 day. Pt states she has tried the OTC meds.

## 2019-04-06 NOTE — ED Provider Notes (Signed)
[place]    CSN: [id number] Arrival date & time: 04/06/19  0820      History   Chief Complaint Chief Complaint  Patient presents with  . Hemorrhoids    HPI Cindy Armstrong is a 29 y.o. female no contributing past medical history presenting today for evaluation of hemorrhoids.  Patient states that over the past 2 to 3 days she has had persistent pain and swelling to her rectum.  Has history of hemorrhoids previously with pregnancy, but has not had any recurrent issues over the past few years.  She does report often straining with bowel movements, but no worse than normal.  She has tried over-the-counter suppositories and creams without relief.  Has noted some blood and clotting.  Denies fevers.  Denies abdominal pain.  Denies family history of colon cancer.  HPI  Past Medical History:  Diagnosis Date  . Anemia     Patient Active Problem List   Diagnosis Date Noted  . Acute costochondritis 01/18/2019  . Trichomonas vaginalis infection 11/23/2016  . IUD (intrauterine device) in place 11/13/2016  . Screening for cervical cancer 11/13/2016  . Screening examination for STD (sexually transmitted disease) 11/13/2016    Past Surgical History:  Procedure Laterality Date  . Wisdom teeth removal (11/07/13) Bilateral     OB History    Gravida  2   Para  2   Term  0   Preterm  2   AB  0   Living  2     SAB  0   TAB  0   Ectopic  0   Multiple  0   Live Births  2            Home Medications    Prior to Admission medications   Medication Sig Start Date End Date Taking? Authorizing Provider  hydrocortisone (ANUSOL-HC) 2.5 % rectal cream Place 1 application rectally 2 (two) times daily. 04/06/19   Zoriana Oats C, PA-C  levonorgestrel (MIRENA) 20 MCG/24HR IUD 1 each by Intrauterine route once.    [provider]    Family History Family History  Problem Relation Age of Onset  . Diabetes Mother   . Drug abuse Mother   .  Learning disabilities Brother   . Alcohol abuse Father   . Drug abuse Father   . Hypertension Maternal Aunt   . Arthritis Neg Hx   . Asthma Neg Hx   . Birth defects Neg Hx   . Cancer Neg Hx   . COPD Neg Hx   . Depression Neg Hx   . Early death Neg Hx   . Hearing loss Neg Hx   . Heart disease Neg Hx   . Hyperlipidemia Neg Hx   . Kidney disease Neg Hx   . Mental illness Neg Hx   . Mental retardation Neg Hx   . Miscarriages / Stillbirths Neg Hx   . Stroke Neg Hx   . Vision loss Neg Hx   . Other Neg Hx     Social History Social History   Tobacco Use  . Smoking status: Former Smoker    Years: 3.00    Quit date: 2015    Years since quitting: 6.1  . Smokeless tobacco: Never Used  Substance Use Topics  . Alcohol use: No  . Drug use: No     Allergies   Patient has no known allergies.   Review of Systems Review of Systems  Constitutional: Negative for fatigue and fever.  HENT: Negative for mouth sores.   Eyes: Negative for visual disturbance.  Respiratory: Negative for shortness of breath.   Cardiovascular: Negative for chest pain.  Gastrointestinal: Positive for rectal pain. Negative for abdominal pain, nausea and vomiting.  Genitourinary: Negative for genital sores.  Musculoskeletal: Negative for arthralgias and joint swelling.  Skin: Negative for color change, rash and wound.  Neurological: Negative for dizziness, weakness, light-headedness and headaches.     Physical Exam Triage Vital Signs ED Triage Vitals  Enc Vitals Group     BP 04/06/19 0908 125/66     Pulse Rate 04/06/19 0908 79     Resp 04/06/19 0908 16     Temp 04/06/19 0908 98.6 F (37 C)     Temp Source 04/06/19 0908 Oral     SpO2 04/06/19 0908 100 %     Weight 04/06/19 0906 144 lb 9.6 oz (65.6 kg)     Height --      Head Circumference --      Peak Flow --      Pain Score 04/06/19 0906 7     Pain Loc --      Pain Edu? --      Excl. in [place]? --    No data found.  Updated Vital Signs BP  125/66 (BP Location: Right Arm)   Pulse 79   Temp 98.6 F (37 C) (Oral)   Resp 16   Wt 144 lb 9.6 oz (65.6 kg)   SpO2 100%   BMI 25.61 kg/m   Visual Acuity Right Eye Distance:   Left Eye Distance:   Bilateral Distance:    Right Eye Near:   Left Eye Near:    Bilateral Near:     Physical Exam Vitals and nursing note reviewed.  Constitutional:      Appearance: She is well-developed.     Comments: No acute distress  HENT:     Head: Normocephalic and atraumatic.     Nose: Nose normal.  Eyes:     Conjunctiva/sclera: Conjunctivae normal.  Cardiovascular:     Rate and Rhythm: Normal rate.  Pulmonary:     Effort: Pulmonary effort is normal. No respiratory distress.  Abdominal:     General: There is no distension.  Genitourinary:    Comments: Rectum with external hemorrhoid at 6 o'clock position, tender to palpation, nonthrombosed, no other perirectal tenderness induration or erythema noted, no palpable masses on DRE Musculoskeletal:        General: Normal range of motion.     Cervical back: Neck supple.  Skin:    General: Skin is warm and dry.  Neurological:     Mental Status: She is alert and oriented to person, place, and time.      UC Treatments / Results  Labs (all labs ordered are listed, but only abnormal results are displayed) Labs Reviewed - No data to display  EKG   Radiology No results found.  Procedures Procedures (including critical care time)  Medications Ordered in UC Medications - No data to display  Initial Impression / Assessment and Plan / UC Course  I have reviewed the triage vital signs and the nursing notes.  Pertinent labs & imaging results that were available during my care of the patient were reviewed by me and considered in my medical decision making (see chart for details).     Exam consistent with external hemorrhoid, no sign of perirectal abscess.  Will provide Anusol HC to apply topically and discussed further symptomatic and  supportive measures to further relieve hemorrhoids.  Continue to monitor,Discussed strict return precautions. Patient verbalized understanding and is agreeable with plan.  Final Clinical Impressions(s) / UC Diagnoses   Final diagnoses:  External hemorrhoid     Discharge Instructions     Please try using anusol hc cream topically to hemorroid twice daily Warm soaks in hot bath Stool softener- colace/docusate twice daily ; Increase fiber and water in diet Avoid straining Sitting on tea bag that has soaked in warm water or broken cabbage leaves  Please follow up if not improving or worsening    ED Prescriptions    Medication Sig Dispense Auth. Provider   hydrocortisone (ANUSOL-HC) 2.5 % rectal cream Place 1 application rectally 2 (two) times daily. 30 g Yashira Offenberger, [place] C, PA-C     PDMP not reviewed this encounter.   Janith Lima, [place] 04/06/19 [phone]

## 2019-04-06 NOTE — Discharge Instructions (Addendum)
Please try using anusol hc cream topically to hemorroid twice daily Warm soaks in hot bath Stool softener- colace/docusate twice daily ; Increase fiber and water in diet Avoid straining Sitting on tea bag that has soaked in warm water or broken cabbage leaves  Please follow up if not improving or worsening

## 2019-04-11 ENCOUNTER — Ambulatory Visit (INDEPENDENT_AMBULATORY_CARE_PROVIDER_SITE_OTHER): Payer: BC Managed Care – PPO | Admitting: Family Medicine

## 2019-04-11 ENCOUNTER — Other Ambulatory Visit: Payer: Self-pay

## 2019-04-11 ENCOUNTER — Encounter: Payer: Self-pay | Admitting: Family Medicine

## 2019-04-11 VITALS — BP 112/72 | HR 71

## 2019-04-11 DIAGNOSIS — K641 Second degree hemorrhoids: Secondary | ICD-10-CM

## 2019-04-11 MED ORDER — NITROGLYCERIN 2 % TD OINT: 0.5000 [in_us] | TOPICAL_OINTMENT | Freq: Four times a day (QID) | TRANSDERMAL | 0 refills | Status: DC

## 2019-04-11 MED ORDER — LIDOCAINE-HYDROCORTISONE ACE 2-2 % RE KIT: 1.0000 "application " | PACK | Freq: Two times a day (BID) | RECTAL | 1 refills | Status: DC | PRN

## 2019-04-11 MED ORDER — POLYETHYLENE GLYCOL 3350 17 G PO PACK: 17.0000 g | PACK | Freq: Every day | ORAL | 0 refills | Status: DC

## 2019-04-11 NOTE — Patient Instructions (Signed)
Hemorrhoids Hemorrhoids are swollen veins that may develop:  In the butt (rectum). These are called internal hemorrhoids.  Around the opening of the butt (anus). These are called external hemorrhoids. Hemorrhoids can cause pain, itching, or bleeding. Most of the time, they do not cause serious problems. They usually get better with diet changes, lifestyle changes, and other home treatments. What are the causes? This condition may be caused by:  Having trouble pooping (constipation).  Pushing hard (straining) to poop.  Watery poop (diarrhea).  Pregnancy.  Being very overweight (obese).  Sitting for long periods of time.  Heavy lifting or other activity that causes you to strain.  Anal sex.  Riding a bike for a long period of time. What are the signs or symptoms? Symptoms of this condition include:  Pain.  Itching or soreness in the butt.  Bleeding from the butt.  Leaking poop.  Swelling in the area.  One or more lumps around the opening of your butt. How is this diagnosed? A doctor can often diagnose this condition by looking at the affected area. The doctor may also:  Do an exam that involves feeling the area with a gloved hand (digital rectal exam).  Examine the area inside your butt using a small tube (anoscope).  Order blood tests. This may be done if you have lost a lot of blood.  Have you get a test that involves looking inside the colon using a flexible tube with a camera on the end (sigmoidoscopy or colonoscopy). How is this treated? This condition can usually be treated at home. Your doctor may tell you to change what you eat, make lifestyle changes, or try home treatments. If these do not help, procedures can be done to remove the hemorrhoids or make them smaller. These may involve:  Placing rubber bands at the base of the hemorrhoids to cut off their blood supply.  Injecting medicine into the hemorrhoids to shrink them.  Shining a type of light  energy onto the hemorrhoids to cause them to fall off.  Doing surgery to remove the hemorrhoids or cut off their blood supply. Follow these instructions at home: Eating and drinking   Eat foods that have a lot of fiber in them. These include whole grains, beans, nuts, fruits, and vegetables.  Ask your doctor about taking products that have added fiber (fibersupplements).  Reduce the amount of fat in your diet. You can do this by: ? Eating low-fat dairy products. ? Eating less red meat. ? Avoiding processed foods.  Drink enough fluid to keep your pee (urine) pale yellow. Managing pain and swelling   Take a warm-water bath (sitz bath) for 20 minutes to ease pain. Do this 3-4 times a day. You may do this in a bathtub or using a portable sitz bath that fits over the toilet.  If told, put ice on the painful area. It may be helpful to use ice between your warm baths. ? Put ice in a plastic bag. ? Place a towel between your skin and the bag. ? Leave the ice on for 20 minutes, 2-3 times a day. General instructions  Take over-the-counter and prescription medicines only as told by your doctor. ? Medicated creams and medicines may be used as told.  Exercise often. Ask your doctor how much and what kind of exercise is best for you.  Go to the bathroom when you have the urge to poop. Do not wait.  Avoid pushing too hard when you poop.  Keep your   butt dry and clean. Use wet toilet paper or moist towelettes after pooping.  Do not sit on the toilet for a long time.  Keep all follow-up visits as told by your doctor. This is important. Contact a doctor if you:  Have pain and swelling that do not get better with treatment or medicine.  Have trouble pooping.  Cannot poop.  Have pain or swelling outside the area of the hemorrhoids. Get help right away if you have:  Bleeding that will not stop. Summary  Hemorrhoids are swollen veins in the butt or around the opening of the  butt.  They can cause pain, itching, or bleeding.  Eat foods that have a lot of fiber in them. These include whole grains, beans, nuts, fruits, and vegetables.  Take a warm-water bath (sitz bath) for 20 minutes to ease pain. Do this 3-4 times a day. This information is not intended to replace advice given to you by your health care provider. Make sure you discuss any questions you have with your health care provider. Document Revised: 02/04/2018 Document Reviewed: 06/18/2017 Elsevier Patient Education  2020 Elsevier Inc.  

## 2019-04-15 DIAGNOSIS — K641 Second degree hemorrhoids: Secondary | ICD-10-CM | POA: Insufficient documentation

## 2019-04-15 NOTE — Assessment & Plan Note (Signed)
External hemorrhoid, possibly thrombosed. -MiraLAX -Prednisone with lidocaine -Preparation H -Ambulatory referral to general surgery for resection

## 2019-04-15 NOTE — Progress Notes (Signed)
    SUBJECTIVE:   CHIEF COMPLAINT / HPI:   Patient presents for follow-up from ED for ongoing hemorrhoid pain.  Patient has had this issue for approximately 1 week.  Was seen in the ED for pain.  She is tried Anusol and hemorrhoid wipes OTC without much improvement.  She does have some mild bleeding when wiping.  Does have history of constipation.  Has tried docusate stool softeners.  PERTINENT  PMH / PSH:  Noncontributory  OBJECTIVE:   BP 112/72   Pulse 71   SpO2 98%   Gen: NAD, resting comfortably Pulm: NWOB,  GI:  Soft, Nontender, Nondistended Rectal: Grade 2 hemorrhoid, tender to palpation MSK: no edema, cyanosis, or clubbing noted Skin: warm, dry Neuro: grossly normal, moves all extremities Psych: Normal affect and thought content  Chaperoned by Luci Bank   ASSESSMENT/PLAN:   Grade II hemorrhoids External hemorrhoid, possibly thrombosed. -MiraLAX -Prednisone with lidocaine -Preparation H -Ambulatory referral to general surgery for resection     Bonnita Hollow, MD [place]

## 2019-10-13 ENCOUNTER — Ambulatory Visit: Payer: BC Managed Care – PPO | Attending: Family

## 2019-10-13 DIAGNOSIS — Z23 Encounter for immunization: Secondary | ICD-10-CM

## 2019-10-21 NOTE — Progress Notes (Signed)
   Covid-19 Vaccination Clinic  Name:  Cindy Armstrong    MRN: [id number] DOB: 1990/05/03  10/21/2019  Ms. [name] was observed post Covid-19 immunization for 15 minutes without incident. She was provided with Vaccine Information Sheet and instruction to access the V-Safe system.   Ms. [name] was instructed to call 911 with any severe reactions post vaccine: Marland Kitchen Difficulty breathing  . Swelling of face and throat  . A fast heartbeat  . A bad rash all over body  . Dizziness and weakness   Immunizations Administered    Name Date Dose VIS Date Route   Pfizer COVID-19 Vaccine 10/13/2019 11:45 AM 0.3 mL 04/06/2018 Intramuscular   Manufacturer: [place]   Lot: [id number]   [place]: [id number]

## 2019-11-10 ENCOUNTER — Ambulatory Visit: Payer: BC Managed Care – PPO | Attending: Family

## 2019-11-10 DIAGNOSIS — Z23 Encounter for immunization: Secondary | ICD-10-CM

## 2020-02-21 ENCOUNTER — Other Ambulatory Visit: Payer: Self-pay

## 2020-02-21 ENCOUNTER — Encounter: Payer: Self-pay | Admitting: Family Medicine

## 2020-02-21 ENCOUNTER — Ambulatory Visit (INDEPENDENT_AMBULATORY_CARE_PROVIDER_SITE_OTHER): Payer: Self-pay | Admitting: Family Medicine

## 2020-02-21 DIAGNOSIS — K602 Anal fissure, unspecified: Secondary | ICD-10-CM

## 2020-02-21 DIAGNOSIS — K641 Second degree hemorrhoids: Secondary | ICD-10-CM

## 2020-02-21 MED ORDER — POLYETHYLENE GLYCOL 3350 17 G PO PACK: 17.0000 g | PACK | Freq: Every day | ORAL | 0 refills | Status: DC

## 2020-02-21 MED ORDER — LIDOCAINE HCL URETHRAL/MUCOSAL 2 % EX GEL: 1.0000 "application " | CUTANEOUS | 1 refills | Status: DC | PRN

## 2020-02-21 NOTE — Patient Instructions (Addendum)
Multimodal treatment  1. Sitz baths (see below)  2. Fiber (metamucil powder, chews, capsules, tablets)  3. Laxative/stool softener (Mirliax) 4. Topical numbing ointment (Sent to pharamcy.     How to Take a [employer] A sitz bath is a warm water bath that may be used to care for your rectum, genital area, or the area between your rectum and genitals (perineum). In a sitz bath, the water only comes up to your hips and covers your buttocks. A sitz bath may be done in a bathtub or with a portable sitz bath that fits over the toilet. Your health care provider may recommend a sitz bath to help:  Relieve pain and discomfort after delivering a baby.  Relieve pain and itching from hemorrhoids or anal fissures.  Relieve pain after certain surgeries.  Relax muscles that are sore or tight. How to take a sitz bath Take 3-4 sitz baths a day, or as many as told by your health care provider. Bathtub sitz bath To take a sitz bath in a bathtub: 1. Partially fill a bathtub with warm water. The water should be deep enough to cover your hips and buttocks when you are sitting in the tub. 2. Follow your health care provider's instructions if you are told to put medicine in the water. 3. Sit in the water. Open the tub drain a little, and leave it open during your bath. 4. Turn on the warm water again, enough to replace the water that is draining out. Keep the water running throughout your bath. This helps keep the water at the right level and temperature. 5. Soak in the water for 15-20 minutes, or as long as told by your health care provider. 6. When you are done, be careful when you stand up. You may feel dizzy. 7. After the sitz bath, pat yourself dry. Do not rub your skin to dry it.   Over-the-toilet sitz bath To take a sitz bath with an over-the-toilet basin: 1. Follow the manufacturer's instructions. 2. Fill the basin with warm water. 3. Follow your health care provider's instructions if you were  told to put medicine in the water. 4. Sit on the seat. Make sure the water covers your buttocks and perineum. 5. Soak in the water for 15-20 minutes, or as long as told by your health care provider. 6. After the sitz bath, pat yourself dry. Do not rub your skin to dry it. 7. Clean and dry the basin between uses. 8. Discard the basin if it cracks, or according to the manufacturer's instructions.   Contact a health care provider if:  Your pain or itching gets worse. Do not continue with sitz baths if your symptoms get worse.  You have new symptoms. Do not continue with sitz baths until you talk with your health care provider. Summary  A sitz bath is a warm water bath in which the water only comes up to your hips and covers your buttocks.  A sitz bath may help relieve pain and discomfort after delivering a baby. It also may help with pain and itching from hemorrhoids or anal fissures, or pain after certain surgeries. It can also help to relax muscles that are sore or tight.  Take 3-4 sitz baths a day, or as many as told by your health care provider. Soak in the water for 15-20 minutes.  Do not continue with sitz baths if your symptoms get worse. This information is not intended to replace advice given to you by your health  care provider. Make sure you discuss any questions you have with your health care provider. Document Revised: 10/13/2019 Document Reviewed: 10/13/2019 Elsevier Patient Education  2021 [place] Fissure, Adult  An anal fissure is a small tear or crack in the tissue of the anus. Bleeding from a fissure usually stops on its own within a few minutes. However, bleeding will often occur again with each bowel movement until the fissure heals. What are the causes? This condition is usually caused by passing a large or hard stool (feces). Other causes include:  Constipation.  Frequent diarrhea.  Inflammatory bowel disease (Crohn's disease or ulcerative  colitis).  Childbirth.  Infections.  Anal sex. What are the signs or symptoms? Symptoms of this condition include:  Bleeding from the rectum.  Small amounts of blood seen on your stool, on the toilet paper, or in the toilet after a bowel movement. The blood coats the outside of the stool and is not mixed with the stool.  Painful bowel movements.  Itching or irritation around the anus. How is this diagnosed? A health care provider may diagnose this condition by closely examining the anal area. An anal fissure can usually be seen with careful inspection. In some cases, a rectal exam may be performed, or a short tube (anoscope) may be used to examine the anal canal. How is this treated? Initial treatment for this condition may include:  Taking steps to avoid constipation. This may include making changes to your diet, such as increasing your intake of fiber or fluid.  Taking fiber supplements. These supplements can soften your stool to help make bowel movements easier. Your health care provider may also prescribe a stool softener if your stool is hard.  Taking sitz baths. This may help to heal the tear.  Using medicated creams or ointments. These may be prescribed to lessen discomfort. Treatments that are sometimes used if initial treatments do not work well or if the condition is more severe may include:  Botulinum injection.  Surgery to repair the fissure. Follow these instructions at home: Eating and drinking  Avoid foods that may cause constipation, such as bananas, milk, and other dairy products.  Eat all fruits, except bananas.  Drink enough fluid to keep your urine pale yellow.  Eat foods that are high in fiber, such as beans, whole grains, and fresh fruits and vegetables.   General instructions  Take over-the-counter and prescription medicines only as told by your health care provider.  Use creams or ointments only as told by your health care provider.  Keep the  anal area clean and dry.  Take sitz baths as told by your health care provider. Do not use soap in the sitz baths.  Keep all follow-up visits as told by your health care provider. This is important.   Contact a health care provider if you have:  More bleeding.  A fever.  Diarrhea that is mixed with blood.  Pain that continues.  Ongoing problems that are getting worse rather than better. Summary  An anal fissure is a small tear or crack in the tissue of the anus. This condition is usually caused by passing a large or hard stool (feces). Other causes include constipation and frequent diarrhea.  Initial treatment for this condition may include taking steps to avoid constipation, such as increasing your intake of fiber or fluid.  Follow instructions for care as told by your health care provider.  Contact your health care provider if you have more bleeding  or your problem is getting worse rather than better.  Keep all follow-up visits as told by your health care provider. This is important. This information is not intended to replace advice given to you by your health care provider. Make sure you discuss any questions you have with your health care provider. Document Revised: 07/09/2017 Document Reviewed: 07/09/2017 Elsevier Patient Education  [place].

## 2020-02-21 NOTE — Progress Notes (Unsigned)
   SUBJECTIVE:  CHIEF COMPLAINT / HPI:   Rectal Pain  Patient reports rectal pain x 2 weeks. She reports extreme pain with bowel movements and frank blood with BM. She reports previous history of hemorrhoids, which she went to general surgery for and they did not offer surgery as they were not severe enough. Patient reports that since that time, she had resolution, but recently had acute onset of rectal pain. She is unable to sit directly on her bottom due to pain. She does report history of constipation. Reports hemorrhoids first present during pregnancy.  She does not have any personal or family history of Crohns, IBS.   PERTINENT  PMH / PSH: hemorrhoids   OBJECTIVE:  BP 100/62   Pulse (!) 102   Wt 141 lb 12.8 oz (64.3 kg)   SpO2 99%   BMI 25.12 kg/m   General: well appearing, non-toxic.  Rectal: external tissue on anus consistent with possible external hemorrhoid. No obvious bleeding, erythema, pllor. Anoscope inserted and patient is moderate - severe distress due to pain. Linear fissure appreciated at posterior midline. Anoscope limited due to pain. No internal hemorrhoid appreciated. Chaperone present during exam.   ASSESSMENT/PLAN:  Anal fissure Pain likely due to anal fissure appreciated on anoscopy today. She has severe pain with mild palpation of external rectal tissue with possibility of thrombosed external hemorrhoid as well. No history of Crohns symptoms and no family history. Conservative management with lidocaine jelly, sitz baths and bowel regimen for constipation. Patient provided handout for further information and education about suspected course. If no improvement, can consider topical vasodilators.    Patient due to pap smear, which she declined due to rectal pain. She is agreeable to come back at her earliest convenience.   Wilber Oliphant, MD [place]

## 2020-02-22 ENCOUNTER — Encounter: Payer: Self-pay | Admitting: Family Medicine

## 2020-02-22 DIAGNOSIS — K602 Anal fissure, unspecified: Secondary | ICD-10-CM | POA: Insufficient documentation

## 2020-02-22 NOTE — Assessment & Plan Note (Signed)
Pain likely due to anal fissure appreciated on anoscopy today. She has severe pain with mild palpation of external rectal tissue with possibility of thrombosed external hemorrhoid as well. No history of Crohns symptoms and no family history. Conservative management with lidocaine jelly, sitz baths and bowel regimen for constipation. Patient provided handout for further information and education about suspected course. If no improvement, can consider topical vasodilators.

## 2020-05-08 ENCOUNTER — Other Ambulatory Visit: Payer: Self-pay

## 2020-05-08 ENCOUNTER — Encounter: Payer: Self-pay | Admitting: Family Medicine

## 2020-05-08 ENCOUNTER — Ambulatory Visit (INDEPENDENT_AMBULATORY_CARE_PROVIDER_SITE_OTHER): Payer: BC Managed Care – PPO | Admitting: Family Medicine

## 2020-05-08 ENCOUNTER — Other Ambulatory Visit (HOSPITAL_COMMUNITY)
Admission: RE | Admit: 2020-05-08 | Discharge: 2020-05-08 | Disposition: A | Discharge status: Home / Self Care | Payer: Medicaid Other | Source: Ambulatory Visit | Attending: Family Medicine | Admitting: Family Medicine

## 2020-05-08 DIAGNOSIS — A599 Trichomoniasis, unspecified: Secondary | ICD-10-CM

## 2020-05-08 DIAGNOSIS — Z124 Encounter for screening for malignant neoplasm of cervix: Secondary | ICD-10-CM

## 2020-05-08 DIAGNOSIS — Z599 Problem related to housing and economic circumstances, unspecified: Secondary | ICD-10-CM | POA: Insufficient documentation

## 2020-05-08 DIAGNOSIS — Z113 Encounter for screening for infections with a predominantly sexual mode of transmission: Secondary | ICD-10-CM | POA: Insufficient documentation

## 2020-05-08 DIAGNOSIS — M12811 Other specific arthropathies, not elsewhere classified, right shoulder: Secondary | ICD-10-CM | POA: Diagnosis not present

## 2020-05-08 DIAGNOSIS — M12819 Other specific arthropathies, not elsewhere classified, unspecified shoulder: Secondary | ICD-10-CM | POA: Insufficient documentation

## 2020-05-08 DIAGNOSIS — K641 Second degree hemorrhoids: Secondary | ICD-10-CM | POA: Diagnosis not present

## 2020-05-08 DIAGNOSIS — Z1159 Encounter for screening for other viral diseases: Secondary | ICD-10-CM

## 2020-05-08 NOTE — Assessment & Plan Note (Signed)
Previously noted to have ASCUS. -Follow-up Pap smear -Follow-up GC/chlamydia, HIV, RPR

## 2020-05-08 NOTE — Assessment & Plan Note (Signed)
We discussed the importance of keeping her stools soft and using MiraLAX.  She was interested in referral to general surgery to discuss removal of the hemorrhoids. -Placed referral to general surgery

## 2020-05-08 NOTE — Progress Notes (Signed)
    SUBJECTIVE:   CHIEF COMPLAINT / HPI:   Cindy Armstrong presents to clinic today for her well woman exam  Abnormal Pap smear Her last Pap smear was performed in 2018 and is notable for ASCUS.  She was told to follow-up for a colposcopy but that never ended up happening.  She has no current complaints of vaginal discharge, pelvic discomfort or abdominal discomfort.    Sexual history She is sexually active and would like to be tested for STIs.  No complaints of vaginal discharge, pelvic discomfort abdominal discomfort at this time.  She has an IUD for birth control.  Shoulder discomfort She works as a Oncologist labor.  She notes that she occasionally has right shoulder pain which makes it difficult for her to remain active with family life when she returns home from work.  She notices this discomfort most when reaching overhead.  Hemorrhoids She has had hemorrhoids for several years since the birth of her children.  She notes that the best way to control hemorrhoids is to keep her stools soft using MiraLAX.  She was wondering if there is anything that she could do to have the hemorrhoids removed because she does not like the way they look.  PERTINENT  PMH / PSH: See HPI  OBJECTIVE:   BP 110/60   Pulse 71   Ht 5\' 3"  (1.6 m)   Wt 141 lb (64 kg)   SpO2 98%   BMI 24.98 kg/m    General: Alert and cooperative and appears to be in no acute distress Cardio: Normal S1 and S2, no S3 or S4. Rhythm is regular. No murmurs or rubs.   Pulm: Clear to auscultation bilaterally, no crackles, wheezing, or diminished breath sounds. Normal respiratory effort Abdomen: Bowel sounds normal. Abdomen soft and non-tender.  Pelvic: Normal-appearing external genitalia.  Moist, pink vaginal mucosa.  Os visualized with IUD strings protruding from the os.  Mild leukorrhea.  Samples taken.  Mild friability of the os cytology brush. Extremities: No peripheral edema. Warm/ well  perfused.  Strong radial pulses. Neuro: Cranial nerves grossly intact  Shoulder: Inspection reveals no abnormalities, atrophy or asymmetry. Palpation is normal with no tenderness over AC joint or bicipital groove. ROM is full in all planes. Moderate discomfort noted with internal rotation of the right shoulder No signs of impingement with negative Neer and Hawkin's tests, empty can. Speeds and Yergason's tests normal. Normal scapular function observed. No painful arc and no drop arm sign.   ASSESSMENT/PLAN:   Grade II hemorrhoids We discussed the importance of keeping her stools soft and using MiraLAX.  She was interested in referral to general surgery to discuss removal of the hemorrhoids. -Placed referral to general surgery  Screening for cervical cancer Previously noted to have ASCUS. -Follow-up Pap smear -Follow-up GC/chlamydia, HIV, RPR  Rotator cuff arthropathy Only mild discomfort with internal rotation of the right shoulder at this time.  Based on physical exam, most likely subscapular.  She was encouraged to use NSAIDs to help reduce inflammation and provided physical therapy exercises and Thera-Band to use at home.  She was encouraged to return to clinic if her symptoms symptoms worsened.     Matilde Haymaker, MD [place]

## 2020-05-08 NOTE — Assessment & Plan Note (Signed)
Only mild discomfort with internal rotation of the right shoulder at this time.  Based on physical exam, most likely subscapular.  She was encouraged to use NSAIDs to help reduce inflammation and provided physical therapy exercises and Thera-Band to use at home.  She was encouraged to return to clinic if her symptoms symptoms worsened.

## 2020-05-08 NOTE — Patient Instructions (Signed)
It was great to see you today.  Here is a quick review of the things we talked about:   Screening for STIs: I will let you know the results of your labs today.  Rotator cuff injury: It looks like you do have some inflammation/irritation of your rotator cuff.  It seems like it is most likely the subscapularis.  I recommend that you follow the exercises that I provided with a Thera-Band.  Shoot for 2-3 sets of exercises a day for the next several weeks.  If this does seem to worsen, I recommend that you come back for further evaluation.  If all of your labs are normal, I will send you a message over my chart or send you a letter.  If there is anything to discuss, I will give you a phone call.

## 2020-05-09 ENCOUNTER — Encounter: Payer: Self-pay | Admitting: Family Medicine

## 2020-05-09 LAB — CERVICOVAGINAL ANCILLARY ONLY
Chlamydia: NEGATIVE
Comment: NEGATIVE
Comment: NEGATIVE
Comment: NORMAL
Neisseria Gonorrhea: NEGATIVE
Trichomonas: NEGATIVE

## 2020-05-09 LAB — HEPATITIS C ANTIBODY: Hep C Virus Ab: <0.1 s/co ratio (ref 0.0–0.9)

## 2020-05-09 LAB — RPR: RPR Ser Ql: NONREACTIVE

## 2020-05-09 LAB — HIV ANTIBODY (ROUTINE TESTING W REFLEX): HIV Screen 4th Generation wRfx: NONREACTIVE

## 2020-05-11 LAB — CYTOLOGY - PAP

## 2020-06-14 ENCOUNTER — Ambulatory Visit (INDEPENDENT_AMBULATORY_CARE_PROVIDER_SITE_OTHER): Payer: Self-pay | Admitting: Family Medicine

## 2020-06-14 ENCOUNTER — Other Ambulatory Visit: Payer: Self-pay | Admitting: Family Medicine

## 2020-06-14 ENCOUNTER — Other Ambulatory Visit: Payer: Self-pay

## 2020-06-14 VITALS — BP 117/72 | HR 57 | Wt 143.8 lb

## 2020-06-14 DIAGNOSIS — R87612 Low grade squamous intraepithelial lesion on cytologic smear of cervix (LGSIL): Secondary | ICD-10-CM

## 2020-06-14 DIAGNOSIS — N898 Other specified noninflammatory disorders of vagina: Secondary | ICD-10-CM

## 2020-06-14 LAB — POCT WET PREP (WET MOUNT)
Clue Cells Wet Prep Whiff POC: NEGATIVE
Trichomonas Wet Prep HPF POC: ABSENT

## 2020-06-14 NOTE — Patient Instructions (Signed)
Colposcopy, Care After This sheet gives you information about how to care for yourself after your procedure. Your doctor may also give you more specific instructions. If you have problems or questions, contact your doctor. What can I expect after the procedure? If you did not have a sample of your tissue taken out (did not have a biopsy), you may only have some spotting of blood for a few days. You can go back to your normal activities. If you had a sample of your tissue taken out, it is common to have:  Soreness and mild pain. These may last for a few days.  A light-headed feeling.  Mild bleeding or fluid (discharge) coming from your vagina. The fluid will look dark and grainy. You may have this for a few days. The fluid may be caused by a liquid that was used during your procedure. You may need to wear a sanitary pad.  Spotting of blood for at least 48 hours after the procedure. Follow these instructions at home: Medicines  Take over-the-counter and prescription medicines only as told by your doctor.  Ask your doctor what medicines you can start taking again. This is very important if you take blood thinners. Activity  Limit your activity for the first day after your procedure as told by your doctor.  For at least 3 days, or for as long as told by your doctor, avoid: ? Douching. ? Using tampons. ? Having sex.  Return to your normal activities as told by your doctor. Ask your doctor what activities are safe for you. General instructions  Drink enough fluid to keep your pee (urine) pale yellow.  Ask your doctor if you may take baths, swim, or use a hot tub. You may take showers.  If you use birth control (contraception), keep using it.  Keep all follow-up visits as told by your doctor. This is important.   Contact a doctor if:  You get a skin rash. Get help right away if:  You bleed a lot from your vagina. A lot of bleeding means you use more than one pad an hour for 2  hours in a row.  You have clumps of blood (blood clots) coming from your vagina.  You have a fever or chills.  You have signs of infection. This may be fluid coming from your vagina that is: ? Different than normal. ? Yellow. ? Bad-smelling.  You have very bad pain or cramps in your lower belly that do not get better with medicine.  You faint. Summary  If you did not have a sample of your tissue taken out, you may only have some spotting of blood for a few days. You can go back to your normal activities.  If you had a sample of your tissue taken out, it is common to have mild pain for a few days and spotting for 48 hours.  Avoid douching, using tampons, and having sex for at least 3 days after the procedure or for as long as told.  Get help right away if you have a lot of bleeding, very bad pain, or signs of infection. This information is not intended to replace advice given to you by your health care provider. Make sure you discuss any questions you have with your health care provider. Document Revised: 11/29/2019 Document Reviewed: 01/26/2019 Elsevier Patient Education  2021 [employer].

## 2020-06-14 NOTE — Progress Notes (Signed)
Patient ID: Cindy Armstrong, female   DOB: 14-Jun-1990, 30 y.o.   MRN: [id number]  HPI Cindy Armstrong is a 30 y.o. female presenting for colposcopy.  HPI  Indications: Pap smear on March 2022 showed: low-grade squamous intraepithelial neoplasia (LGSIL - encompassing HPV,mild dysplasia,CIN I) with some celss suggestive of higher grade lesion. Previous colposcopy: HPV related changes, CIN 1, CIN 2  in 2018. Prior cervical treatment: referral to Gynecology.  Past Medical History:  Diagnosis Date  . Anemia     Past Surgical History:  Procedure Laterality Date  . Wisdom teeth removal (11/07/13) Bilateral     Family History  Problem Relation Age of Onset  . Diabetes Mother   . Drug abuse Mother   . Learning disabilities Brother   . Alcohol abuse Father   . Drug abuse Father   . Hypertension Maternal Aunt   . Arthritis Neg Hx   . Asthma Neg Hx   . Birth defects Neg Hx   . Cancer Neg Hx   . COPD Neg Hx   . Depression Neg Hx   . Early death Neg Hx   . Hearing loss Neg Hx   . Heart disease Neg Hx   . Hyperlipidemia Neg Hx   . Kidney disease Neg Hx   . Mental illness Neg Hx   . Mental retardation Neg Hx   . Miscarriages / Stillbirths Neg Hx   . Stroke Neg Hx   . Vision loss Neg Hx   . Other Neg Hx     Social History Social History   Tobacco Use  . Smoking status: Former Smoker    Years: 3.00    Quit date: 2015    Years since quitting: 7.3  . Smokeless tobacco: Never Used  Substance Use Topics  . Alcohol use: No  . Drug use: No    No Known Allergies  Current Outpatient Medications  Medication Sig Dispense Refill  . hydrocortisone (ANUSOL-HC) 2.5 % rectal cream Place 1 application rectally 2 (two) times daily. 30 g 0  . levonorgestrel (MIRENA) 20 MCG/24HR IUD 1 each by Intrauterine route once.    . lidocaine (XYLOCAINE) 2 % jelly Apply 1 application topically as needed. Apply to affected area ass needed for pain 30 mL 1  . Lidocaine-Hydrocortisone Ace 2-2 % KIT  Place 1 application rectally 2 (two) times daily as needed (hemorrhoid irritation). 1 kit 1  . nitroGLYCERIN (NITROGLYN) 2 % ointment Apply 0.5 inches topically 4 (four) times daily. 30 g 0  . polyethylene glycol (MIRALAX / GLYCOLAX) 17 g packet Take 17 g by mouth daily. 14 each 0   No current facility-administered medications for this visit.    Review of Systems Review of Systems  Blood pressure 117/72, pulse (!) 57, weight 143 lb 12.8 oz (65.2 kg).  Physical Exam Physical Exam Genitourinary:    Cervix: Discharge present.      Positive aceto-white lesions with reduced iodine uptake on 1,2,3. Mild vascularization at 5 O'clock White/grey discharge present  General: NAD, well-appearing, well-nourished   Data Reviewed Yes, pap reviewed today 06/14/2020   Assessment    Procedure Details  The risks and benefits of the procedure and Written informed consent obtained.  Speculum placed in vagina and excellent visualization of cervix achieved, cervix swabbed x 3 with acetic acid solution.  Specimens: cervical biopsies at 10 O'clock, 5 O'clock, and 7' O'clock.  ECC specimen completed.   Wet prep completed as patient had vaginal discharge, which showed bacteria without  other findings.  Complications: none.     Plan    Specimens labelled and sent to Pathology. Return to discuss Pathology results in 2 weeks.    Patient with previous abnormal colposcopy with no documented follow-up with gynecology. Will contact patient to see if treatment ever completed and will need to send another referral to gynecology.   Kamil Mchaffie 06/14/2020, 12:57 PM

## 2020-06-17 ENCOUNTER — Telehealth: Payer: Self-pay | Admitting: Family Medicine

## 2020-06-17 NOTE — Telephone Encounter (Signed)
**  After Hours/ Emergency Line Call**  Received a call to report that Cindy Armstrong she had a colposcopy on Friday where three samples were taken for biopsy.  Today she went to have a bowel movement and a piece of brown/red tissue the size of a half dollar expelled vaginally.  She denies any fevers, abdominal pain, vaginal bleeding or pain. Has not had sexual activity since colposcopy.  Denies any weakness, dizziness or shortness of breath. Recommended that she take a picture of clot for future reference.  This is likely residual clot mixed with Lugols solution that she expelled after colposcopy  Red flags discussed.  Will forward to PCP.  Carollee Leitz MD PGY-2, [facility] 06/17/2020 10:30 AM

## 2020-06-21 ENCOUNTER — Telehealth: Payer: Self-pay | Admitting: Family Medicine

## 2020-06-21 DIAGNOSIS — R87613 High grade squamous intraepithelial lesion on cytologic smear of cervix (HGSIL): Secondary | ICD-10-CM

## 2020-06-21 NOTE — Telephone Encounter (Signed)
Cervical biopsy and ECC showed HSIL. I discussed need for LEEP with her and indication for surgical management.  Referral to Gyn for treatment recommended. She agreed with the plan.  She mentioned she had blood clot from her vagina s/p cervical biopsy and has been having brownish discharge.  Appointment made for next Monday on [facility] with Dr. Higinio Plan for reassessment.

## 2020-06-25 ENCOUNTER — Ambulatory Visit (INDEPENDENT_AMBULATORY_CARE_PROVIDER_SITE_OTHER): Payer: Self-pay | Admitting: Family Medicine

## 2020-06-25 ENCOUNTER — Encounter: Payer: Self-pay | Admitting: Family Medicine

## 2020-06-25 ENCOUNTER — Telehealth: Payer: Self-pay | Admitting: Family Medicine

## 2020-06-25 ENCOUNTER — Other Ambulatory Visit: Payer: Self-pay

## 2020-06-25 VITALS — BP 110/62 | HR 71 | Ht 63.0 in | Wt 141.2 lb

## 2020-06-25 DIAGNOSIS — N898 Other specified noninflammatory disorders of vagina: Secondary | ICD-10-CM | POA: Insufficient documentation

## 2020-06-25 DIAGNOSIS — Z3200 Encounter for pregnancy test, result unknown: Secondary | ICD-10-CM

## 2020-06-25 LAB — POCT URINE PREGNANCY: Preg Test, Ur: NEGATIVE

## 2020-06-25 NOTE — Patient Instructions (Addendum)
It was wonderful to see you today.  This may have been left over from the solution used during your biopsy.  Please make sure that you follow-up with OB/GYN, I will check on the process of this referral today.

## 2020-06-25 NOTE — Telephone Encounter (Signed)
Error

## 2020-06-25 NOTE — Assessment & Plan Note (Addendum)
Fortunately resolved, initially yellow clot may have been from the Monsel solution.  Pelvic exam unchanged from previous. U preg negative.  Recently had wet prep/STD screening and does not wish to repeat today.  Provided reassurance and recommended observation.  Will check in on GYN referral.

## 2020-06-25 NOTE — Progress Notes (Signed)
    SUBJECTIVE:   CHIEF COMPLAINT / HPI: vaginal discharge/bleeding   Cindy Armstrong is a 30 year old female presenting for evaluation of vaginal bleeding/discharge s/p biopsy.  She had a colposcopy with biopsy on 5/5, biopsy/ECC showing HSIL.  She is awaiting referral to gynecology for treatment options with necessity for LEEP.  In discussions of results with Dr. Gwendlyn Deutscher, she mentioned that she had a large clot with some brown discharge, scheduled for reassessment.  Today, she reports that is doing much better.  She has had no further discharge since this past Friday, 5/13.  She only had the clot once on Saturday, 5/7.  Reports it was a white/yellowish color about half the size of her palm, brings a photo on her phone.  She has not heard from GYN yet.  LMP 4/5, reports her periods often can be irregular.  Last sexually active over a month ago.  PERTINENT  PMH / PSH: IUD in place, HSIL   OBJECTIVE:   BP 110/62   Pulse 71   Ht 5\' 3"  (1.6 m)   Wt 141 lb 3.2 oz (64 kg)   LMP 06/17/2020   SpO2 99%   BMI 25.01 kg/m   General: Alert, NAD Lungs: No increased WOB  Abdomen: soft, non-tender Ext: Warm, dry, 2+ distal pulses Pelvic exam: VULVA: normal appearing vulva with no masses, tenderness or lesions, VAGINA: normal appearing vagina with normal color and discharge, no lesions, CERVIX: IUD strings present from cervical os, mild friability of os with enlarged transformation zone.  Chaperoned by CMA.  ASSESSMENT/PLAN:   Vaginal discharge Fortunately resolved, initially yellow clot may have been from the Monsel solution.  Pelvic exam unchanged from previous. U preg negative.  Recently had wet prep/STD screening and does not wish to repeat today.  Provided reassurance and recommended observation.  Will check in on GYN referral.    Follow-up if any concern, awaiting GYN referral.  [name], [place]

## 2020-07-25 ENCOUNTER — Encounter: Payer: Self-pay | Admitting: Women's Health

## 2020-07-25 ENCOUNTER — Other Ambulatory Visit: Payer: Self-pay

## 2020-07-25 ENCOUNTER — Ambulatory Visit: Payer: Medicaid Other | Admitting: Women's Health

## 2020-08-27 ENCOUNTER — Other Ambulatory Visit: Payer: Self-pay

## 2020-08-27 ENCOUNTER — Encounter: Payer: Self-pay | Admitting: Obstetrics and Gynecology

## 2020-08-27 ENCOUNTER — Ambulatory Visit (INDEPENDENT_AMBULATORY_CARE_PROVIDER_SITE_OTHER): Payer: BC Managed Care – PPO | Admitting: Obstetrics and Gynecology

## 2020-08-27 VITALS — BP 116/62 | Ht 63.0 in | Wt 138.0 lb

## 2020-08-27 DIAGNOSIS — R87612 Low grade squamous intraepithelial lesion on cytologic smear of cervix (LGSIL): Secondary | ICD-10-CM

## 2020-08-27 DIAGNOSIS — N871 Moderate cervical dysplasia: Secondary | ICD-10-CM

## 2020-08-27 NOTE — Progress Notes (Signed)
30 yo P2 with LGSIL on 04/2020 pap smear and HGSIL on ECC and colpo biopsy here for LEEP consultation. Patient is sexually active using Mirena IUD for contraception. She denies any pelvic pain or abnormal discharge. Mirena IUD inserted in 2021. Patient is without any complaints  Past Medical History:  Diagnosis Date   Anemia    Past Surgical History:  Procedure Laterality Date   Wisdom teeth removal (11/07/13) Bilateral    Family History  Problem Relation Age of Onset   Diabetes Mother    Drug abuse Mother    Learning disabilities Brother    Alcohol abuse Father    Drug abuse Father    Hypertension Maternal Aunt    Arthritis Neg Hx    Asthma Neg Hx    Birth defects Neg Hx    Cancer Neg Hx    COPD Neg Hx    Depression Neg Hx    Early death Neg Hx    Hearing loss Neg Hx    Heart disease Neg Hx    Hyperlipidemia Neg Hx    Kidney disease Neg Hx    Mental illness Neg Hx    Mental retardation Neg Hx    Miscarriages / Stillbirths Neg Hx    Stroke Neg Hx    Vision loss Neg Hx    Other Neg Hx    Social History   Tobacco Use   Smoking status: Former    Years: 3.00    Types: Cigarettes    Quit date: 2015    Years since quitting: 7.5   Smokeless tobacco: Never  Substance Use Topics   Alcohol use: No   Drug use: No   ROS See pertinent in HPI. All other systems reviewed and non contributory Blood pressure 116/62, height 5\' 3"  (1.6 m), weight 138 lb (62.6 kg). GENERAL: Well-developed, well-nourished female in no acute distress.  NEURO: alert and oriented x 3  A/P 30 yo with HGSIL on ECC and cervical biopsy - Discussed LEEP and answered all questions - Patient has not received Gardasil vaccine and plans to contact insurance company for coverage - Plan to return in 1-2 weeks for LEEP

## 2020-08-27 NOTE — Progress Notes (Signed)
Pt is here today to discuss LEEP treatment. LMP/BCM: IUD. Pap: 05/08/20 LGSIL Colpo: 06/14/20 HGSIL. IUD recently replaced in 2021.

## 2020-09-12 ENCOUNTER — Ambulatory Visit: Payer: BC Managed Care – PPO | Admitting: Family Medicine

## 2020-09-12 ENCOUNTER — Other Ambulatory Visit: Payer: Self-pay

## 2020-09-12 ENCOUNTER — Encounter: Payer: Self-pay | Admitting: Family Medicine

## 2020-09-12 DIAGNOSIS — R21 Rash and other nonspecific skin eruption: Secondary | ICD-10-CM | POA: Diagnosis not present

## 2020-09-12 DIAGNOSIS — T63464A Toxic effect of venom of wasps, undetermined, initial encounter: Secondary | ICD-10-CM | POA: Diagnosis not present

## 2020-09-12 MED ORDER — CETIRIZINE HCL 10 MG PO TABS: 10.0000 mg | ORAL_TABLET | Freq: Every day | ORAL | 3 refills | Status: DC

## 2020-09-12 MED ORDER — HYDROCORTISONE 1 % EX OINT: 1.0000 "application " | TOPICAL_OINTMENT | Freq: Two times a day (BID) | CUTANEOUS | 0 refills | Status: AC

## 2020-09-12 NOTE — Progress Notes (Signed)
     SUBJECTIVE:   CHIEF COMPLAINT / HPI:   Cindy Armstrong is a 30 y.o. female presents for rash  Rash  Stung by one wasp on both calves 2 weeks ago. Since then she developed a rash all over her back. The rash started immediately after she was stung by the wasp. No change in diet, laundry detergent, soap, shampoo, hiking, recent travel etc. The rash is pruritic, no drainage. They "scab up" from itching. Has not tried any creams or lotions. The bites on the legs are also pruritic. Denies fevers.    [place] Office Visit from 09/12/2020 in [place]  PHQ-9 Total Score 3         PERTINENT  PMH / PSH: rotator cuff arthropathy   OBJECTIVE:   BP 110/63   Pulse 65   Ht '5\' 3"'$  (1.6 m)   Wt 140 lb 12.8 oz (63.9 kg)   LMP  (LMP Unknown)   SpO2 100%   BMI 24.94 kg/m    General: Alert, no acute distress, pleasant  Cardio: well perfused  Pulm: normal work of breathing Neuro: Cranial nerves grossly intact          ASSESSMENT/PLAN:   Wasp sting Pruritic insect bite over calves bilaterally where wasp sting was, healing well. Pt also has a similar hyperpigmented papular rash over torso which appear to be bites but could also prurigo nodular. Recommended antihistamines and topical hydrocortisone ointment to apply to affected areas. Also recommended calamine lotion, avoiding scratching and fragrances bath products. Follow up with PCP if no improvement in sx.      Lattie Haw, MD PGY-3 [place]

## 2020-09-12 NOTE — Patient Instructions (Signed)
Thank you for coming to see me today. It was a pleasure. Today we discussed your rash. I recommend: -Avoiding scratching -short nails -antihistamine-zyrtec -hydrocortisone ointment only on the affected areas -calamine lotions  Please follow-up with PCP in a few weeks if your symptoms are still there.  If you have any questions or concerns, please do not hesitate to call the office at [phone].  Best wishes,   Dr Posey Pronto

## 2020-09-13 DIAGNOSIS — T63461A Toxic effect of venom of wasps, accidental (unintentional), initial encounter: Secondary | ICD-10-CM | POA: Insufficient documentation

## 2020-09-13 NOTE — Assessment & Plan Note (Addendum)
Pruritic insect bite over calves bilaterally where wasp sting was, healing well. Pt also has a similar hyperpigmented papular rash over torso which appear to be bites but could also prurigo nodular. Recommended antihistamines and topical hydrocortisone ointment to apply to affected areas. Also recommended calamine lotion, avoiding scratching and fragrances bath products. Follow up with PCP if no improvement in sx.

## 2021-03-15 ENCOUNTER — Other Ambulatory Visit: Payer: Self-pay

## 2021-03-15 ENCOUNTER — Ambulatory Visit (INDEPENDENT_AMBULATORY_CARE_PROVIDER_SITE_OTHER): Payer: BC Managed Care – PPO | Admitting: Family Medicine

## 2021-03-15 ENCOUNTER — Encounter: Payer: Self-pay | Admitting: Family Medicine

## 2021-03-15 VITALS — BP 100/65 | HR 67 | Ht 63.0 in | Wt 142.4 lb

## 2021-03-15 DIAGNOSIS — Z975 Presence of (intrauterine) contraceptive device: Secondary | ICD-10-CM | POA: Diagnosis not present

## 2021-03-15 DIAGNOSIS — Z30431 Encounter for routine checking of intrauterine contraceptive device: Secondary | ICD-10-CM

## 2021-03-15 DIAGNOSIS — N941 Unspecified dyspareunia: Secondary | ICD-10-CM

## 2021-03-15 DIAGNOSIS — R87613 High grade squamous intraepithelial lesion on cytologic smear of cervix (HGSIL): Secondary | ICD-10-CM | POA: Diagnosis not present

## 2021-03-15 NOTE — Patient Instructions (Addendum)
[facility]: [phone].  Please make sure to call their office to get your procedure scheduled soon.  Check and make sure with our office how they would like to timeout these procedures.  I would even ask them if they wanted to try and do them all within 1 visit with their office or if they would like you to follow back with this office.  Once you hear from them, please call and let us know so that we can plan a visit if we need to.

## 2021-03-15 NOTE — Progress Notes (Signed)
° ° °  SUBJECTIVE:   CHIEF COMPLAINT / HPI:   Patient reports that she would like to have her Mirena removed.  It was placed about 2 years ago.  She reports that she initially did not have any problems but that she has progressively started to have pain and then has had blood and spotting with intercourse since the beginning of 2022.  She does not have intercourse very often which is why she did not notice it but wants to make sure that the Mirena is not was causing the problems.  She would like to get it removed and replaced with the Nexplanon when able.  Patient also notes that she has an abnormal Pap smear and went for a colposcopy and was referred to gynecology.  She was supposed to get a procedure done but states that she was never called to have it completed.  PERTINENT  PMH / PSH: Reviewed  OBJECTIVE:   BP 100/65    Pulse 67    Wt 142 lb 6.4 oz (64.6 kg)    SpO2 100%    BMI 25.23 kg/m   General: NAD, well-appearing, well-nourished Respiratory: No respiratory distress, breathing comfortably, able to speak in full sentences Skin: warm and dry, no rashes noted on exposed skin Psych: Appropriate affect and mood Pelvic exam: VULVA: normal appearing vulva with no masses, tenderness or lesions, VAGINA: normal appearing vagina with normal color and discharge, no lesions, CERVIX: IUD strings present at the cervical os, friability of the os with enlarged transformation zone, exam chaperoned by CMA.  ASSESSMENT/PLAN:   High grade cervical dysplasia Patient was noted to have abnormal Pap smear followed by abnormal colposcopy and was referred to gynecology.  Patient was seen on 7/18 with recommendations to have a LEEP procedure done in the next 1 to 2 weeks but patient did not have follow-up after this. - Provided phone number to [facility] [place] [facility] -Patient to get LEEP procedure with them  Dyspareunia Pain and bleeding with intercourse, possibly related to IUD versus cervical  friability.  At this point, do feel that the cervical friability may be contributing more than the IUD.  Counseled patient that she should discuss with OB/GYN about LEEP procedure and timing of Mirena removal and Nexplanon insertion.  We are able to do the contraception management at our office but that it may be able to be done in 1 visit with OB/GYN if able. - Patient to check in with OB/GYN - Patient requesting Mirena removal and Nexplanon insertion   Cindy Armstrong, [place]

## 2021-04-02 ENCOUNTER — Other Ambulatory Visit (HOSPITAL_COMMUNITY)
Admission: RE | Admit: 2021-04-02 | Discharge: 2021-04-02 | Disposition: A | Discharge status: Home / Self Care | Payer: BC Managed Care – PPO | Source: Ambulatory Visit | Attending: Obstetrics and Gynecology | Admitting: Obstetrics and Gynecology

## 2021-04-02 ENCOUNTER — Ambulatory Visit: Payer: BC Managed Care – PPO | Admitting: Obstetrics and Gynecology

## 2021-04-02 ENCOUNTER — Other Ambulatory Visit: Payer: Self-pay

## 2021-04-02 ENCOUNTER — Encounter: Payer: Self-pay | Admitting: Obstetrics and Gynecology

## 2021-04-02 VITALS — Ht 63.0 in | Wt 141.8 lb

## 2021-04-02 DIAGNOSIS — R87613 High grade squamous intraepithelial lesion on cytologic smear of cervix (HGSIL): Secondary | ICD-10-CM

## 2021-04-02 DIAGNOSIS — Z30432 Encounter for removal of intrauterine contraceptive device: Secondary | ICD-10-CM

## 2021-04-02 DIAGNOSIS — Z01812 Encounter for preprocedural laboratory examination: Secondary | ICD-10-CM | POA: Diagnosis not present

## 2021-04-02 LAB — POCT URINE PREGNANCY: Preg Test, Ur: NEGATIVE

## 2021-04-02 NOTE — Progress Notes (Signed)
GYNECOLOGY CLINIC PROCEDURE NOTE  Cindy Armstrong is a 31 y.o. [phone] also here for IUD removal and LEEP. Patient has had the IUD since 2016 and plans to use Nexplanon for contraception. No GYN concerns.    IUD Removal  Patient identified, informed consent performed, consent signed.  Patient was in the dorsal lithotomy position, normal external genitalia was noted.  A speculum was placed in the patient's vagina, normal discharge was noted, no lesions. The cervix was visualized, no lesions, no abnormal discharge.  The strings of the IUD were grasped and pulled using ring forceps. The IUD was removed in its entirety. Patient tolerated the procedure well.    Patient will use condoms for contraception and plans to return for Nexplanon insertion  LEEP PROCEDURE  Patient identified, informed consent obtained, signed copy in chart, time out performed.  Pap smear and colposcopy reviewed.   Pap LGSIL 04/2020 Colpo Biopsy HGSIL 06/2020 ECC HGSIL 06/2020 Teflon coated speculum with smoke evacuator placed.  Cervix visualized. Paracervical block placed.  A large size LOOP used to remove cone of cervix using blend of cut and cautery on LEEP machine.  Edges/Base cauterized with Ball.  Monsel's solution used for hemostasis.  Patient tolerated procedure well.  Patient given post procedure instructions.  Follow up in 6 months for repeat pap or as needed.

## 2021-04-04 LAB — SURGICAL PATHOLOGY

## 2021-04-16 ENCOUNTER — Encounter: Payer: Self-pay | Admitting: Family Medicine

## 2021-04-16 ENCOUNTER — Ambulatory Visit (INDEPENDENT_AMBULATORY_CARE_PROVIDER_SITE_OTHER): Payer: BC Managed Care – PPO | Admitting: Family Medicine

## 2021-04-16 ENCOUNTER — Other Ambulatory Visit (HOSPITAL_COMMUNITY)
Admission: RE | Admit: 2021-04-16 | Discharge: 2021-04-16 | Disposition: A | Discharge status: Home / Self Care | Payer: BC Managed Care – PPO | Source: Ambulatory Visit | Attending: Family Medicine | Admitting: Family Medicine

## 2021-04-16 ENCOUNTER — Other Ambulatory Visit: Payer: Self-pay

## 2021-04-16 VITALS — BP 107/67 | HR 67 | Ht 64.0 in | Wt 137.8 lb

## 2021-04-16 DIAGNOSIS — B9689 Other specified bacterial agents as the cause of diseases classified elsewhere: Secondary | ICD-10-CM | POA: Insufficient documentation

## 2021-04-16 DIAGNOSIS — B3731 Acute candidiasis of vulva and vagina: Secondary | ICD-10-CM | POA: Diagnosis not present

## 2021-04-16 DIAGNOSIS — N760 Acute vaginitis: Secondary | ICD-10-CM | POA: Insufficient documentation

## 2021-04-16 DIAGNOSIS — Z975 Presence of (intrauterine) contraceptive device: Secondary | ICD-10-CM

## 2021-04-16 DIAGNOSIS — Z113 Encounter for screening for infections with a predominantly sexual mode of transmission: Secondary | ICD-10-CM | POA: Insufficient documentation

## 2021-04-16 DIAGNOSIS — Z30017 Encounter for initial prescription of implantable subdermal contraceptive: Secondary | ICD-10-CM

## 2021-04-16 HISTORY — DX: Presence of (intrauterine) contraceptive device: Z97.5

## 2021-04-16 MED ORDER — ETONOGESTREL 68 MG ~~LOC~~ IMPL
68.0000 mg | DRUG_IMPLANT | Freq: Once | SUBCUTANEOUS | Status: AC
  Administered 2021-04-16: 68 mg via SUBCUTANEOUS

## 2021-04-16 NOTE — Progress Notes (Signed)
Patient presents for Nexplanon insertion.  ?IUD removed 04/02/21. No sexual intercourse since IUD removal. ?Consent signed by patient.  ?

## 2021-04-16 NOTE — Progress Notes (Signed)
? ? ? ?  GYNECOLOGY OFFICE PROCEDURE NOTE ? ?Cindy Armstrong is a 31 y.o. [phone] here for nexplanon placement. She desires long-term reversible contraception. IUD was removed on 2/21 and has not been sexually active since that time. Last pap smear was on 04/2020 with a LEEP procedure on 2/21. She has had some vaginal odor to her discharge, otherwise no complaints.  ? ?Nexplanon insertion ? ?Risks/benefits/side effects of Nexplanon have been discussed with patient and her questions have been answered. Patient is aware of the common side effect of irregular bleeding, which the incidence of decreases over time. ? ?BP 107/67   Pulse 67   Ht '5\' 4"'$  (1.626 m)   Wt 137 lb 12.8 oz (62.5 kg)   BMI 23.65 kg/m?  ? ?She is right-handed, so her left arm, approximately 10 cm proximal from the elbow, was cleansed with alcohol and anesthetized with 2 mL of 1% Lidocaine.  The area was cleansed again with betadine and the Nexplanon was inserted per manufacturer's recommendations without difficulty.  A pressure bandage were applied. ? ?Pt was instructed to keep the area clean and dry, remove pressure bandage in 24 hours.  She was given a card indicating date Nexplanon was inserted and date it needs to be removed. Patient self swabbed for vaginal infections. Follow-up PRN problems. ? ? ? ?

## 2021-04-17 ENCOUNTER — Other Ambulatory Visit: Payer: Self-pay | Admitting: Family Medicine

## 2021-04-17 DIAGNOSIS — B9689 Other specified bacterial agents as the cause of diseases classified elsewhere: Secondary | ICD-10-CM

## 2021-04-17 LAB — CERVICOVAGINAL ANCILLARY ONLY
Bacterial Vaginitis (gardnerella): POSITIVE — AB
Candida Glabrata: NEGATIVE
Candida Vaginitis: POSITIVE — AB
Chlamydia: NEGATIVE
Comment: NEGATIVE
Comment: NEGATIVE
Comment: NEGATIVE
Comment: NEGATIVE
Comment: NEGATIVE
Comment: NORMAL
Neisseria Gonorrhea: NEGATIVE
Trichomonas: NEGATIVE

## 2021-04-17 MED ORDER — METRONIDAZOLE 500 MG PO TABS: 500.0000 mg | ORAL_TABLET | Freq: Two times a day (BID) | ORAL | 0 refills | Status: AC

## 2021-04-17 MED ORDER — FLUCONAZOLE 150 MG PO TABS: 150.0000 mg | ORAL_TABLET | Freq: Every day | ORAL | 0 refills | Status: DC

## 2021-05-06 ENCOUNTER — Other Ambulatory Visit: Payer: Self-pay

## 2021-05-06 ENCOUNTER — Ambulatory Visit (HOSPITAL_COMMUNITY)
Admission: RE | Admit: 2021-05-06 | Discharge: 2021-05-06 | Disposition: A | Discharge status: Home / Self Care | Payer: BC Managed Care – PPO | Source: Ambulatory Visit | Attending: Family Medicine | Admitting: Family Medicine

## 2021-05-06 ENCOUNTER — Ambulatory Visit: Payer: BC Managed Care – PPO | Admitting: Family Medicine

## 2021-05-06 VITALS — BP 124/78 | HR 90 | Ht 64.0 in | Wt 134.4 lb

## 2021-05-06 DIAGNOSIS — M5442 Lumbago with sciatica, left side: Secondary | ICD-10-CM

## 2021-05-06 MED ORDER — GABAPENTIN 100 MG PO CAPS: 100.0000 mg | ORAL_CAPSULE | Freq: Three times a day (TID) | ORAL | 3 refills | Status: DC

## 2021-05-06 NOTE — Patient Instructions (Signed)
For your low back pain we will get some x-rays.  I will let you know the results when they return.  I am going to prescribe gabapentin.  You can start with taking 1 to 3 tablets at night, you can also take 1 to 3 tablets 2 additional times throughout the day for a total of 3 times daily.  Do not take more than 2 tablets 3 times daily without talking with me.  This medication can make you very sleepy throughout the day and so you should not drive if you are using it. ? ?If you develop any loss of bowel or bladder function, any numbness to your genital region, or if the back pain worsens or does not continue to improve please return.  We can discuss physical therapy if the x-rays look good and the back pain is continuing to be a problem. ?

## 2021-05-06 NOTE — Progress Notes (Signed)
? ? ?  SUBJECTIVE:  ? ?CHIEF COMPLAINT / HPI:  ? ?Back pain: ?Started about two weeks ago. She states she has to lift heavy items for work but denies any specific trauma or injury to it. Denies remembering injuring it. She states that when it came on it was sudden and woke her up at night. It hurts most when laying down and when sitting for long periods of time. No fevers. No night sweats or unexpected weight loss. Pain shoots down left leg. States it has been consistent for 2 weeks. She tried one of her families muscle relaxer which helped a bit. She tried a gabapentin as well which seemed to improve it and she would like a trial of this medicine if deemed appropriate.  She denies any loss of bowel or bladder function or saddle paresthesias. ? ?PERTINENT  PMH / PSH: None relevant ? ?OBJECTIVE:  ? ?BP 124/78   Pulse 90   Ht '5\' 4"'$  (1.626 m)   Wt 134 lb 6.4 oz (61 kg)   SpO2 99%   BMI 23.07 kg/m?   ? ?General: NAD, pleasant, able to participate in exam ?Respiratory: No respiratory distress ?MSK: Strength 5/5 upper and lower extremities bilaterally, fine touch sensation present lower extremities bilaterally, some pain with palpation midline spine in the L4-L5 region, she does have pain with palpating the muscles on the left side of the lower back including the region of the piriformis.  She does have positive straight leg raise test on the left. ?Psych: Normal affect and mood ? ?ASSESSMENT/PLAN:  ? ?Low back pain: ?This started acutely about 2 weeks ago.  She denies any trauma or injury.  It woke her up from sleep.  She denies any night sweats, fever, and expected weight loss.  On physical exam she does have some midline discomfort in the lumbar region as well as pain with palpating the region of the piriformis and the muscles lateral to the spine on the left side.  She got positive left sided straight leg raise test.  She did try gabapentin from a family member and it seemed to improve her symptoms.  We will get  x-rays due to the severity of her discomfort.  We will initiate some gabapentin as this seemed to work well for her.  Recommended using Tylenol and ibuprofen.  If her symptoms improve over the next few days and the x-rays are negative we do not need to do anything further versus considering physical therapy versus other interventions if any abnormalities are seen on the x-ray.  No red flag symptoms.  Discussed return precautions. ? ? ?Lurline Del, DO ?[place]  ?

## 2021-05-08 ENCOUNTER — Telehealth: Payer: Self-pay

## 2021-05-08 ENCOUNTER — Ambulatory Visit: Payer: BC Managed Care – PPO | Admitting: Family Medicine

## 2021-05-08 ENCOUNTER — Encounter: Payer: Self-pay | Admitting: Family Medicine

## 2021-05-08 VITALS — BP 111/73 | HR 78 | Ht 64.0 in | Wt 133.0 lb

## 2021-05-08 DIAGNOSIS — Z711 Person with feared health complaint in whom no diagnosis is made: Secondary | ICD-10-CM | POA: Diagnosis not present

## 2021-05-08 NOTE — Patient Instructions (Signed)
The bony structure you feel is just part of your pelvic bone and would not need to do anything about it.  If you develop any other concerns please let me know. ?

## 2021-05-08 NOTE — Telephone Encounter (Signed)
Patient calls nurse line reporting bilateral "bumps" on her inner thighs.  ? ?Patient reports the bumps are very painful and are continuing to grow in size. Patient reports they have been present for ~4 days. ? ?Patient denies any drainage or fevers/chills.  ? ?Patient scheduled for this afternoon for evaluation.  ?

## 2021-05-08 NOTE — Progress Notes (Signed)
? ? ?  SUBJECTIVE:  ? ?CHIEF COMPLAINT / HPI:  ? ?"Bony structure on inner thigh": ?31 year old female presenting after feeling a hard bony structure on in the pelvic region just superior and lateral to the vagina on each side.  She denies any skin changes over top of the structure, no draining, no open lesions, no fevers.  She states she just noticed it on each side due to the fact she was having pelvic cramps on her menstrual cycle. ? ?PERTINENT  PMH / PSH: None relevant ? ?OBJECTIVE:  ? ?BP 111/73   Pulse 78   Ht '5\' 4"'$  (1.626 m)   Wt 133 lb (60.3 kg)   LMP 05/06/2021 Comment: pregnancy waiver signed for lumbar xrays  SpO2 100%   BMI 22.83 kg/m?   ? ?General: NAD, pleasant, able to participate in exam ?Respiratory: No respiratory distress ?MSK: No overlying skin changes, no open lesions, no drainage.  There is a palpable bony structure just superior and lateral to ?Psych: Normal affect and mood ? ?ASSESSMENT/PLAN:  ?  ?Patient concern-Bony prominence of pelvis: ?Patient concerned about palpating a bony prominence on each side of her pelvis just superior to her joint region.  On physical exam this is part of the pelvic bone.  There is no concern for lymph nodes given the bony prominence palpated, no open skin lesions.  No other concerns.  Provided reassurance and recommended follow-up if she develops any other concerns. ? ?Discussed x-ray results. ? ?Lurline Del, DO ?[place]  ?

## 2021-07-16 ENCOUNTER — Encounter: Payer: Self-pay | Admitting: *Deleted

## 2021-10-01 ENCOUNTER — Ambulatory Visit: Payer: BC Managed Care – PPO | Admitting: Student

## 2021-10-01 VITALS — BP 124/70 | HR 82 | Wt 149.0 lb

## 2021-10-01 DIAGNOSIS — L84 Corns and callosities: Secondary | ICD-10-CM | POA: Diagnosis not present

## 2021-10-01 DIAGNOSIS — W57XXXA Bitten or stung by nonvenomous insect and other nonvenomous arthropods, initial encounter: Secondary | ICD-10-CM | POA: Diagnosis not present

## 2021-10-01 DIAGNOSIS — K644 Residual hemorrhoidal skin tags: Secondary | ICD-10-CM

## 2021-10-01 DIAGNOSIS — S30861A Insect bite (nonvenomous) of abdominal wall, initial encounter: Secondary | ICD-10-CM

## 2021-10-01 MED ORDER — CETIRIZINE HCL 10 MG PO TABS: 10.0000 mg | ORAL_TABLET | Freq: Every day | ORAL | 0 refills | Status: DC

## 2021-10-01 MED ORDER — TRIAMCINOLONE ACETONIDE 0.1 % EX OINT: 1.0000 | TOPICAL_OINTMENT | Freq: Two times a day (BID) | CUTANEOUS | 0 refills | Status: AC

## 2021-10-01 MED ORDER — HYDROCORTISONE ACETATE 25 MG RE SUPP: 25.0000 mg | Freq: Two times a day (BID) | RECTAL | 0 refills | Status: AC

## 2021-10-01 NOTE — Progress Notes (Signed)
    SUBJECTIVE:   CHIEF COMPLAINT / HPI: "Breaking out"  Abdomen Spots on stomach that she noticed 3-4 days ago that have been itching and bothering her.  She says that her kids go outside she believes that this may be a bug bite causing this.  She denies any other rash anywhere else.  No systemic systems.  Hemmorhoid  Patient says she would like to have her bone checked to make sure there is no boil or hemorrhoid present.  She says that she has had hemorrhoids since having her son and that she has seen general surgery about this before but has not had any surgical interventions.  She denies taking any MiraLAX but says that her stools are not hard.  She denies any bleeding in her stools.  She says the only time it hurts is when she is wiping that area.  She denies any fevers.  Callus on foot The last 3 toes of her foot curved inwards and she says that she has calluses forming on the bottom that has been bothering her even after taking this off.  She says she would like to see a podiatrist to assess this further.  PERTINENT  PMH / PSH: noncontributory  OBJECTIVE:   BP 124/70   Pulse 82   Wt 149 lb (67.6 kg)   SpO2 98%   BMI 25.58 kg/m   General: Well appearing, NAD, awake, alert, responsive to questions Head: Normocephalic atraumatic Respiratory: chest rises symmetrically,  no increased work of breathing Abdomen: Soft, non-tender, 3 raised lesions on right side of abdomen that are pruritic, no overlying erythema or fluctuance Rectal: Small external hernia present on examination, no bleeding, anoscopy attempted but internal mucosa not fully visualized, digital rectal exam without internal masses palpated CMA Jazmin Hartsell present for exam   ASSESSMENT/PLAN:   External hemorrhoid Small external hernia on examination today.  Advised supportive measures with sitz bath, MiraLAX as needed. - hydrocortisone (ANUSOL-HC) 25 MG suppository; Place 1 suppository (25 mg total) rectally 2  (two) times daily for 14 days.  Dispense: 28 suppository; Refill: 0 -Consider referral to general surgery if patient is still being bothered by this   Callus Inverted toes on examination with underlying calluses.  Patient would like podiatry referral today -Podiatry referral placed -avoid tight fitting shoes  Insect bite of abdominal wall, initial encounter - triamcinolone ointment (KENALOG) 0.1 %; Apply 1 Application topically 2 (two) times daily for 7 days.  Dispense: 30 g; Refill: 0 - cetirizine (ZYRTEC ALLERGY) 10 MG tablet; Take 1 tablet (10 mg total) by mouth daily.  Dispense: 30 tablet; Refill: 0  Gerrit Heck, MD [place]

## 2021-10-01 NOTE — Assessment & Plan Note (Signed)
Small external hernia on examination today.  Advised supportive measures with sitz bath, MiraLAX as needed. - hydrocortisone (ANUSOL-HC) 25 MG suppository; Place 1 suppository (25 mg total) rectally 2 (two) times daily for 14 days.  Dispense: 28 suppository; Refill: 0 -Consider referral to general surgery if patient is still being bothered by this

## 2021-10-01 NOTE — Patient Instructions (Addendum)
It was great to see you! Thank you for allowing me to participate in your care!   Our plans for today:  -I have prescribed Anusol to take twice a day for 2 weeks to help with hemorrhoid pain, please return or message if pain is worsening or having any bleeding at that time we can refer you to general surgery again if it is worsening -I have prescribed triamcinolone ointment for your skin bumps on your belly to help  -Prescribed Zyrtec 10 mg daily to help with itching -I have referred you to podiatry you will get a call  Take care and seek immediate care sooner if you develop any concerns.  Gerrit Heck, MD

## 2021-10-01 NOTE — Assessment & Plan Note (Signed)
Inverted toes on examination with underlying calluses.  Patient would like podiatry referral today -Podiatry referral placed -avoid tight fitting shoes

## 2021-10-10 ENCOUNTER — Ambulatory Visit (INDEPENDENT_AMBULATORY_CARE_PROVIDER_SITE_OTHER): Payer: BC Managed Care – PPO

## 2021-10-10 ENCOUNTER — Ambulatory Visit: Payer: BC Managed Care – PPO | Admitting: Podiatry

## 2021-10-10 ENCOUNTER — Encounter: Payer: Self-pay | Admitting: Podiatry

## 2021-10-10 DIAGNOSIS — D2372 Other benign neoplasm of skin of left lower limb, including hip: Secondary | ICD-10-CM

## 2021-10-10 DIAGNOSIS — M2042 Other hammer toe(s) (acquired), left foot: Secondary | ICD-10-CM

## 2021-10-10 NOTE — Progress Notes (Signed)
  Subjective:  Patient ID: Cindy Armstrong, female    DOB: 11-27-1990,  MRN: [id number] HPI Chief Complaint  Patient presents with   Toe Pain    3rd toe left - toe curled since birth, over the last couple years toe has gotten callused, tries to scrape off, very tender when thick   New Patient (Initial Visit)    31 y.o. female presents with the above complaint.   ROS: Denies fever chills nausea vomiting muscle aches pains calf pain back pain chest pain shortness of breath.  Past Medical History:  Diagnosis Date   Anemia    Past Surgical History:  Procedure Laterality Date   Wisdom teeth removal (11/07/13) Bilateral     Current Outpatient Medications:    cetirizine (ZYRTEC ALLERGY) 10 MG tablet, Take 1 tablet (10 mg total) by mouth daily., Disp: 30 tablet, Rfl: 0   gabapentin (NEURONTIN) 100 MG capsule, Take 1 capsule (100 mg total) by mouth 3 (three) times daily. (Patient not taking: Reported on 10/01/2021), Disp: 90 capsule, Rfl: 3   hydrocortisone (ANUSOL-HC) 25 MG suppository, Place 1 suppository (25 mg total) rectally 2 (two) times daily for 14 days., Disp: 28 suppository, Rfl: 0   polyethylene glycol (MIRALAX / GLYCOLAX) 17 g packet, Take 17 g by mouth daily. (Patient not taking: Reported on 10/01/2021), Disp: 14 each, Rfl: 0  No Known Allergies Review of Systems Objective:  There were no vitals filed for this visit.  General: Well developed, nourished, in no acute distress, alert and oriented x3   Dermatological: Skin is warm, dry and supple bilateral. Nails x 10 are well maintained; remaining integument appears unremarkable at this time. There are no open sores, no preulcerative lesions, no rash or signs of infection present.  Painful benign skin lesion plantar aspect third toes bilateral.  She also has a solitary benign lesion beneath the fourth metatarsal head of the right foot.  No erythema edema cellulitis drainage or odor.  Vascular: Dorsalis Pedis artery and Posterior  Tibial artery pedal pulses are 2/4 bilateral with immedate capillary fill time. Pedal hair growth present. No varicosities and no lower extremity edema present bilateral.   Neruologic: Grossly intact via light touch bilateral. Vibratory intact via tuning fork bilateral. Protective threshold with Semmes Wienstein monofilament intact to all pedal sites bilateral. Patellar and Achilles deep tendon reflexes 2+ bilateral. No Babinski or clonus noted bilateral.   Musculoskeletal: No gross boney pedal deformities bilateral. No pain, crepitus, or limitation noted with foot and ankle range of motion bilateral. Muscular strength 5/5 in all groups tested bilateral.  Hammertoe deformities rigid in nature third left flexible third right  Gait: Unassisted, Nonantalgic.    Radiographs:  Radiographs taken to taken today demonstrate an osseously mature individual third digit left foot demonstrates an adductovarus rotated hammertoe deformity with joint change.  Assessment & Plan:   Assessment: Hammertoe deformity resulting in reactive benign skin lesions.  Solitary poor keratoma benign skin lesion Sub fourth met right foot.   Plan: Discussed etiology pathology conservative surgical therapies at this point debrided all benign skin lesions x3 and discussed surgery to the toes third toe left is going require an arthrodesis with K wire and then flexor tenotomy third digit right foot.  She understands and is amenable to it will follow-up with me on an as-needed basis to discuss surgical intervention.     Marlina Cataldi T. [place], [place]

## 2021-10-28 ENCOUNTER — Other Ambulatory Visit: Payer: Self-pay | Admitting: Student

## 2021-10-28 DIAGNOSIS — W57XXXA Bitten or stung by nonvenomous insect and other nonvenomous arthropods, initial encounter: Secondary | ICD-10-CM

## 2021-10-29 ENCOUNTER — Encounter: Payer: Self-pay | Admitting: Family Medicine

## 2021-10-29 ENCOUNTER — Ambulatory Visit: Payer: BC Managed Care – PPO | Admitting: Family Medicine

## 2021-10-29 VITALS — BP 104/66 | HR 78 | Ht 63.0 in | Wt 150.2 lb

## 2021-10-29 DIAGNOSIS — T63301A Toxic effect of unspecified spider venom, accidental (unintentional), initial encounter: Secondary | ICD-10-CM | POA: Diagnosis not present

## 2021-10-29 DIAGNOSIS — Z23 Encounter for immunization: Secondary | ICD-10-CM

## 2021-10-29 NOTE — Patient Instructions (Addendum)
It was nice seeing you today!  Try topical hydrocortisone as needed for itching.  Stay well, Zola Button, MD [place] [phone]  --  Make sure to check out at the front desk before you leave today.  Please arrive at least 15 minutes prior to your scheduled appointments.  If you had blood work today, I will send you a MyChart message or a letter if results are normal. Otherwise, I will give you a call.  If you had a referral placed, they will call you to set up an appointment. Please give [place] a call if you don't hear back in the next 2 weeks.  If you need additional refills before your next appointment, please call your pharmacy first.

## 2021-10-29 NOTE — Progress Notes (Signed)
    SUBJECTIVE:   CHIEF COMPLAINT / HPI:  Chief Complaint  Patient presents with   Insect Bite    Bite happened around 10/19/21, on both legs    Patient reports that about 10 days ago she noticed a bit on both of her legs. Thought it may be a spider bite but unsure, did not see the insect. She had pain at first and a little bit of swelling which have both now resolved. She is still having some itchiness. Denies fever, chills.  PERTINENT  PMH / PSH: noncontributory  Patient Care Team: Rise Patience, DO as PCP - General (Family Medicine)   OBJECTIVE:   BP 104/66   Pulse 78   Ht '5\' 3"'$  (1.6 m)   Wt 150 lb 3.2 oz (68.1 kg)   LMP 10/22/2021   SpO2 98%   BMI 26.61 kg/m   Physical Exam Constitutional:      General: She is not in acute distress. Cardiovascular:     Rate and Rhythm: Normal rate.  Pulmonary:     Effort: Pulmonary effort is normal. No respiratory distress.  Musculoskeletal:     Cervical back: Neck supple.  Skin:    Comments: Mildly erythematous hyperpigmented papules with hyperpigmented core in pair distribution noted on each shin  Neurological:     Mental Status: She is alert.   Right shin   Left shin       10/29/2021    2:17 PM  Depression screen PHQ 2/9  Decreased Interest 0  Down, Depressed, Hopeless 0  PHQ - 2 Score 0  Altered sleeping 0  Tired, decreased energy 0  Change in appetite 0  Feeling bad or failure about yourself  0  Trouble concentrating 0  Moving slowly or fidgety/restless 0  Suicidal thoughts 0  PHQ-9 Score 0  Difficult doing work/chores Not difficult at all     {Show previous vital signs (optional):23777}    ASSESSMENT/PLAN:   Spider bite Lesions appear to be most consistent with spider bite, appears to be mild local reaction. - Tdap - recommend OTC topical hydrocortisone  Return if symptoms worsen or fail to improve.   Zola Button, MD [place]

## 2021-11-11 ENCOUNTER — Other Ambulatory Visit: Payer: Self-pay | Admitting: Family Medicine

## 2021-11-11 DIAGNOSIS — S30861A Insect bite (nonvenomous) of abdominal wall, initial encounter: Secondary | ICD-10-CM

## 2021-11-11 DIAGNOSIS — W57XXXA Bitten or stung by nonvenomous insect and other nonvenomous arthropods, initial encounter: Secondary | ICD-10-CM

## 2021-11-15 ENCOUNTER — Ambulatory Visit: Payer: BC Managed Care – PPO | Admitting: Family Medicine

## 2021-11-15 ENCOUNTER — Encounter: Payer: Self-pay | Admitting: Family Medicine

## 2021-11-15 ENCOUNTER — Other Ambulatory Visit (HOSPITAL_COMMUNITY)
Admission: RE | Admit: 2021-11-15 | Discharge: 2021-11-15 | Disposition: A | Discharge status: Home / Self Care | Payer: BC Managed Care – PPO | Source: Ambulatory Visit | Attending: Family Medicine | Admitting: Family Medicine

## 2021-11-15 VITALS — Ht 63.0 in | Wt 149.4 lb

## 2021-11-15 DIAGNOSIS — Z113 Encounter for screening for infections with a predominantly sexual mode of transmission: Secondary | ICD-10-CM | POA: Insufficient documentation

## 2021-11-15 DIAGNOSIS — A599 Trichomoniasis, unspecified: Secondary | ICD-10-CM

## 2021-11-15 DIAGNOSIS — N939 Abnormal uterine and vaginal bleeding, unspecified: Secondary | ICD-10-CM

## 2021-11-15 LAB — POCT WET PREP (WET MOUNT): Clue Cells Wet Prep Whiff POC: POSITIVE

## 2021-11-15 MED ORDER — NORETHINDRONE ACET-ETHINYL EST 1-20 MG-MCG PO TABS: 1.0000 | ORAL_TABLET | Freq: Every day | ORAL | 2 refills | Status: DC

## 2021-11-15 MED ORDER — METRONIDAZOLE 500 MG PO TABS: 500.0000 mg | ORAL_TABLET | Freq: Two times a day (BID) | ORAL | 0 refills | Status: AC

## 2021-11-15 NOTE — Patient Instructions (Signed)
For your bleeding, this could be related to your Nexplanon.  We will go ahead and try doing 3 months of birth control pills to see if that helps balance the hormone fluctuations.  We will go ahead and do the other STD testing, and the results will come to you through the [facility].  If we need to do any treatment, I will let you know and send in the appropriate treatment.  Make sure to abstain from intercourse while you or your partner is having treatment.

## 2021-11-15 NOTE — Progress Notes (Signed)
    SUBJECTIVE:   CHIEF COMPLAINT / HPI:   STD Testing  - Recently stopped using condoms with partner - Partner tested positive for trich about 2-3 days ago and is being treated - No current vaginal discharge or irritation  Abnormal cycles - Nexplanon replaced IUD - No period for 2 months - Spotted for a bit - Then had irregularities of spotting followed by full period, off for a few days and then the cycle would start. These started in August   PERTINENT  PMH / PSH: Reviewed  OBJECTIVE:   Ht '5\' 3"'$  (1.6 m)   Wt 149 lb 6.4 oz (67.8 kg)   LMP 11/03/2021   BMI 26.47 kg/m   General: NAD, well-appearing, well-nourished Respiratory: No respiratory distress, breathing comfortably, able to speak in full sentences Skin: warm and dry, no rashes noted on exposed skin Psych: Appropriate affect and mood Pelvic exam: normal external genitalia, vulva, vagina, cervix, uterus and adnexa, WET MOUNT done - results: clue cells, excessive bacteria, trichomonads, exam chaperoned by Lavell Anchors, CMA.   ASSESSMENT/PLAN:   STD screening  Trichomoniasis Wet prep positive for trichomoniasis and bacterial vaginosis.  Patient with recent known exposure to trichomoniasis and partner is currently being treated. - Metronidazole 500 mg twice daily x7 days - HIV and RPR - Gonorrhea/committee testing pending  Abnormal uterine bleeding In the setting of Nexplanon placement earlier this year.  Has had irregularities in.  Since, more recently is having multiple episodes of spotting and cycles per month for the last 2 months.  Possibly related to the hormonal changes that can happen with the birth control, patient agreeable to trial of OCPs on top of the birth control to try and get more regularity. - Loestrin x3 months - Follow-up if worsening or if no improvements   Rise Patience, DO [place]

## 2021-11-16 LAB — RPR: RPR Ser Ql: NONREACTIVE

## 2021-11-16 LAB — HIV ANTIBODY (ROUTINE TESTING W REFLEX): HIV Screen 4th Generation wRfx: NONREACTIVE

## 2021-11-19 LAB — CERVICOVAGINAL ANCILLARY ONLY
Chlamydia: NEGATIVE
Comment: NEGATIVE
Comment: NEGATIVE
Comment: NORMAL
Neisseria Gonorrhea: NEGATIVE
Trichomonas: POSITIVE — AB

## 2022-03-21 ENCOUNTER — Ambulatory Visit: Payer: BC Managed Care – PPO | Admitting: Family Medicine

## 2022-03-21 ENCOUNTER — Encounter: Payer: Self-pay | Admitting: Family Medicine

## 2022-03-21 VITALS — BP 130/60 | HR 74 | Ht 63.0 in | Wt 151.0 lb

## 2022-03-21 DIAGNOSIS — K219 Gastro-esophageal reflux disease without esophagitis: Secondary | ICD-10-CM

## 2022-03-21 DIAGNOSIS — N939 Abnormal uterine and vaginal bleeding, unspecified: Secondary | ICD-10-CM

## 2022-03-21 DIAGNOSIS — R5383 Other fatigue: Secondary | ICD-10-CM | POA: Diagnosis not present

## 2022-03-21 MED ORDER — FAMOTIDINE 20 MG PO TABS: 20.0000 mg | ORAL_TABLET | Freq: Two times a day (BID) | ORAL | 1 refills | Status: DC

## 2022-03-21 NOTE — Patient Instructions (Signed)
For your irregular periods, working to go ahead and schedule you for that Nexplanon removal on 2/23 and then we will give you the Depo shot that same day.  For your heartburn, I have sent in a medication called famotidine that you can take twice daily we will trial this out for 2 months.  You can still take the homes as needed.  Fatigue, will get labs today to check on your blood status to see if you have any anemia as well as if your thyroid is functioning normally.

## 2022-03-21 NOTE — Progress Notes (Signed)
    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 - Has been going on a for a few years - reports unexpected weight gain - No red flag symptoms  PERTINENT  PMH / PSH: Reviewed  OBJECTIVE:   BP 130/60   Pulse 74   Ht '5\' 3"'$  (1.6 m)   Wt 151 lb (68.5 kg)   LMP 02/23/2022   SpO2 100%   BMI 26.75 kg/m   General: NAD, well-appearing, well-nourished Respiratory: No respiratory distress, breathing comfortably, able to speak in full sentences Skin: warm and dry, no rashes noted on exposed skin Psych: Appropriate affect and mood  ASSESSMENT/PLAN:   Abnormal uterine bleeding Spotting in the setting of Nexplanon placement that did not improve with trial of OCPs.  Patient would like to switch contraception options at this time, has been 1 year since Nexplanon was placed. - Appointment made for 2/23 for Nexplanon removal - Will have Depo shot given at that visit as well - Discussed risks and benefits  Fatigue Generalized and nonspecific, has been present for several years.  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  Will get basic labs to rule out thyroid and anemia concerns. - TSH - CBC and ferritin panel - Follow-up in 2 weeks  Gastroesophageal reflux disease without esophagitis History provided by the patient seems most consistent with reflux.  Has some mild improvement  with Tums but is needing further control - Famotidine 20 mg 1-2 times daily for 2 months - Continue Tums as needed     Rise Patience, DO [place]

## 2022-03-21 NOTE — Assessment & Plan Note (Signed)
History provided by the patient seems most consistent with reflux.  Has some mild improvement with Tums but is needing further control - Famotidine 20 mg 1-2 times daily for 2 months - Continue Tums as needed

## 2022-03-22 LAB — CBC
Hematocrit: 39.9 % (ref 34.0–46.6)
Hemoglobin: 13.5 g/dL (ref 11.1–15.9)
MCH: 31.1 pg (ref 26.6–33.0)
MCHC: 33.8 g/dL (ref 31.5–35.7)
MCV: 92 fL (ref 79–97)
Platelets: 187 10*3/uL (ref 150–450)
RBC: 4.34 x10E6/uL (ref 3.77–5.28)
RDW: 12.1 % (ref 11.7–15.4)
WBC: 6.5 10*3/uL (ref 3.4–10.8)

## 2022-03-22 LAB — IRON,TIBC AND FERRITIN PANEL
Ferritin: 46 ng/mL (ref 15–150)
Iron Saturation: 24 % (ref 15–55)
Iron: 84 ug/dL (ref 27–159)
Total Iron Binding Capacity: 344 ug/dL (ref 250–450)
UIBC: 260 ug/dL (ref 131–425)

## 2022-03-22 LAB — TSH: TSH: 0.707 u[IU]/mL (ref 0.450–4.500)

## 2022-04-04 ENCOUNTER — Ambulatory Visit: Payer: BC Managed Care – PPO | Admitting: Family Medicine

## 2022-04-07 ENCOUNTER — Ambulatory Visit: Payer: BC Managed Care – PPO | Admitting: Student

## 2022-04-07 DIAGNOSIS — Z3046 Encounter for surveillance of implantable subdermal contraceptive: Secondary | ICD-10-CM | POA: Insufficient documentation

## 2022-04-07 NOTE — Patient Instructions (Incomplete)
It was great seeing you today.  We removed your Nexplanon today. Please leave the bandage on for the next 6 hours or so. You may have bruising over your arm where we removed the Nexplanon.  You received your Dep shot today. You will get your next dose in 3 months.  As always, use condoms during sexual intercourse to prevent sexually transmitted infections   If you have any questions or concerns, please feel free to call the clinic.   Have a wonderful day,  Dr. Orvis Brill [facility] [phone]

## 2022-04-07 NOTE — Progress Notes (Deleted)
    SUBJECTIVE:   CHIEF COMPLAINT / HPI:   Cindy Armstrong is a 32 year-old female here for Nexplanon removal. She had appointment to discuss this with her PCP prior to today's appointment.  Had irregular bleeding while on Nexplanon, trialed OCP without much improvement. Desires Depo-Prover today.  PERTINENT  PMH / PSH: Reviewed  OBJECTIVE:   LMP 02/23/2022   General: Well-appearing, no distress, pleasant Respiratory: Normal WOB on room air. Speaks in full sentences.   ASSESSMENT/PLAN:   Nexplanon removal Risks & benefits of Nexplanon removal discussed. Consent form signed.  The patient denies any allergies to anesthetics or antiseptics.  Procedure: Pt was placed in supine position. Left arm was flexed at the elbow and externally rotated so that her wrist was parallel to her ear, The device was palpated and marked. The site was cleaned with Betadine. The area surrounding the device was covered with a sterile drape. 1% lidocaine was injected just under the device. A scalpel was used to create a small incision. The device was pushed towards the incision. Fibrous tissue surrounding the device was gradually removed from the device. The device was removed and measured to ensure all 4 cm of device was removed. Steri-strips were used to close the incision. Pressure dressing was applied to the patient.  The patient was instructed to removed the pressure dressing in 24 hrs.  The patient was advised to move slowly from a supine to an upright position  The patient denied any concerns or complaints  The patient was instructed to schedule a follow-up appt in 1 month.     Orvis Brill, [place]    {    This will disappear when note is signed, click to select method of visit    :1}

## 2022-04-07 NOTE — Assessment & Plan Note (Deleted)
Risks & benefits of Nexplanon removal discussed. Consent form signed.  The patient denies any allergies to anesthetics or antiseptics.  Procedure: Pt was placed in supine position. Left arm was flexed at the elbow and externally rotated so that her wrist was parallel to her ear, The device was palpated and marked. The site was cleaned with Betadine. The area surrounding the device was covered with a sterile drape. 1% lidocaine was injected just under the device. A scalpel was used to create a small incision. The device was pushed towards the incision. Fibrous tissue surrounding the device was gradually removed from the device. The device was removed and measured to ensure all 4 cm of device was removed. Steri-strips were used to close the incision. Pressure dressing was applied to the patient.  The patient was instructed to removed the pressure dressing in 24 hrs.  The patient was advised to move slowly from a supine to an upright position  The patient denied any concerns or complaints  The patient was instructed to schedule a follow-up appt in 1 month.

## 2022-04-15 ENCOUNTER — Other Ambulatory Visit: Payer: Self-pay | Admitting: Family Medicine

## 2022-04-24 ENCOUNTER — Ambulatory Visit: Payer: BC Managed Care – PPO | Admitting: Family Medicine

## 2022-04-24 VITALS — BP 118/76 | HR 62 | Ht 63.0 in | Wt 149.8 lb

## 2022-04-24 DIAGNOSIS — Z3042 Encounter for surveillance of injectable contraceptive: Secondary | ICD-10-CM | POA: Diagnosis not present

## 2022-04-24 DIAGNOSIS — Z3009 Encounter for other general counseling and advice on contraception: Secondary | ICD-10-CM | POA: Diagnosis not present

## 2022-04-24 DIAGNOSIS — Z3046 Encounter for surveillance of implantable subdermal contraceptive: Secondary | ICD-10-CM | POA: Diagnosis not present

## 2022-04-24 MED ORDER — MEDROXYPROGESTERONE ACETATE 150 MG/ML IM SUSY
150.0000 mg | PREFILLED_SYRINGE | Freq: Once | INTRAMUSCULAR | Status: AC
  Administered 2022-04-24: 150 mg via INTRAMUSCULAR

## 2022-04-24 NOTE — Addendum Note (Signed)
Addended by: Talbot Grumbling on: 04/24/2022 12:08 PM   Modules accepted: Orders

## 2022-04-24 NOTE — Progress Notes (Signed)
Patient started on depo provera today per Dr. Verlon Au orders.   Depo given in [place] today.  Site unremarkable & patient tolerated injection.    Next injection due 07/10/22-07/24/22.  Reminder card given.    Talbot Grumbling, RN

## 2022-04-24 NOTE — Progress Notes (Signed)
    CHIEF COMPLAINT / HPI: Desires removal of Nexplanon as she is having 2 to 3 weeks of spotting every month.  Wants to switch to Depo-Provera.   PERTINENT  PMH / PSH: I have reviewed the patient's medications, allergies, past medical and surgical history, smoking status and updated in the EMR as appropriate.   OBJECTIVE:  BP 118/76   Pulse 62   Ht 5\' 3"  (1.6 m)   Wt 149 lb 12.8 oz (67.9 kg)   SpO2 97%   BMI 26.54 kg/m  GENERAL: Well-developed, no acute distress PROCEDURE NOTE: [place] Patient given informed consent and signed copy in the chart.  Left arm area prepped and draped in the usual sterile fashion. Three cc of lidocaine without epinephrine 1% used for local anesthesia. A small stab incision was made close to the nexplanon with scalpel. Hemostats were used to withdraw the nexplanon. A small bandage was applied over a steri strip  No complications.Patient given follow up instructions should she experience redness, swelling at sight or fever in the next 24 hours. Patient was reminded this totally removes her nexplanon contraceptive devise. (she can now potentially conceive)   ASSESSMENT / PLAN: Nexplanon removed as per patient desires.  Will institute Depo-Provera.  Have discussed risks benefits, contraception, lack of STI protection with Depo-Provera.  No problem-specific Assessment & Plan notes found for this encounter.   Dorcas Mcmurray MD

## 2022-05-12 ENCOUNTER — Other Ambulatory Visit: Payer: Self-pay

## 2022-05-12 ENCOUNTER — Other Ambulatory Visit: Payer: Self-pay | Admitting: Family Medicine

## 2022-05-12 ENCOUNTER — Ambulatory Visit: Payer: BC Managed Care – PPO | Admitting: Family Medicine

## 2022-05-12 VITALS — BP 121/66 | HR 78 | Ht 63.0 in | Wt 143.6 lb

## 2022-05-12 DIAGNOSIS — J069 Acute upper respiratory infection, unspecified: Secondary | ICD-10-CM

## 2022-05-12 DIAGNOSIS — J309 Allergic rhinitis, unspecified: Secondary | ICD-10-CM

## 2022-05-12 DIAGNOSIS — K641 Second degree hemorrhoids: Secondary | ICD-10-CM

## 2022-05-12 DIAGNOSIS — J101 Influenza due to other identified influenza virus with other respiratory manifestations: Secondary | ICD-10-CM | POA: Diagnosis not present

## 2022-05-12 DIAGNOSIS — K649 Unspecified hemorrhoids: Secondary | ICD-10-CM

## 2022-05-12 LAB — POC SOFIA 2 FLU + SARS ANTIGEN FIA
Influenza A, POC: POSITIVE — AB
Influenza B, POC: NEGATIVE
SARS Coronavirus 2 Ag: NEGATIVE

## 2022-05-12 MED ORDER — LIDOCAINE HCL URETHRAL/MUCOSAL 2 % EX GEL: 1.0000 | CUTANEOUS | 1 refills | Status: DC | PRN

## 2022-05-12 MED ORDER — OLOPATADINE HCL 0.2 % OP SOLN: 1.0000 [drp] | Freq: Every day | OPHTHALMIC | 12 refills | Status: DC

## 2022-05-12 MED ORDER — FLUTICASONE PROPIONATE 50 MCG/ACT NA SUSP: 2.0000 | Freq: Every day | NASAL | 6 refills | Status: DC

## 2022-05-12 NOTE — Patient Instructions (Addendum)
It was wonderful to see you today! Thank you for choosing [facility].   Please bring ALL of your medications with you to every visit.   Today we talked about:  I am sending in medication for your allergies including Flonase to be used every day. Please use 2 sprays in each nostril while having allergy symptoms and then use 1 puff in each nostril every day for maintenance therapy. I also sent the Pataday drops that can help with allergic eye symptoms of burning and redness. We swabbed you for COVID and flu today and I will follow up with those results. I sent in a refill of the Lidocaine jelly you can apply to the hemorrhoids for management of pain.  Please follow up as needed for persistent symptoms.   We are checking some labs today. If they are abnormal, I will call you. If they are normal, I will send you a MyChart message (if it is active) or a letter in the mail. If you do not hear about your labs in the next 2 weeks, please call the office.  Call the clinic at [phone] if your symptoms worsen or you have any concerns.  Please be sure to schedule follow up at the front desk before you leave today.   Colletta Maryland, DO Family Medicine

## 2022-05-12 NOTE — Progress Notes (Unsigned)
    SUBJECTIVE:   CHIEF COMPLAINT / HPI:   Allergies Feels like her allergies have been flaring the last few days with all the pollen in the air. Endorses allergies year round. States he eyes are burning, itchy and red. Clear nasal drainage.  URI Patient reports sore throat, cough and congestion that started on Friday last week. States she had a fever the first day but nothing since. Occasionally coughs up green phlegm. 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 Eating and drinking as usual.  Hemorrhoids flare Now resolving, States it was worse over the weekend but not has improved. States she is having BM every day and has not needed the Miralax. Previously saw General Surgery in 2022 but interested in going back since she has recurrent flares. Using sitz bath and topical for relief during flare. Declined checking hemorrhoids today.  PERTINENT  PMH / PSH: Allergic rhinitis  OBJECTIVE:   BP 121/66   Pulse 78   Ht 5\' 3"  (1.6 m)   Wt 143 lb 9.6 oz (65.1 kg)   LMP 04/24/2022   SpO2 99%   BMI 25.44 kg/m    General: NAD, pleasant, able to participate in exam HEENT: Erythematous conjunctiva, clear rhinorrhea. Mildly erythematous throat without tonsillar enlargement or exudates. Cardiac: RRR, no murmurs. Respiratory: CTAB, normal effort, No wheezes, rales or rhonchi Extremities: no edema or cyanosis. Skin: warm and dry, no rashes noted  ASSESSMENT/PLAN:   Grade II hemorrhoids Recurrent hemorrhoid flares since the birth of her son 9 years ago. Seen by General Surgery in 2022, did not pursue intervention at that time but patient would like second opinion. Declined rectal exam today. -Referral to General Surgery -Conservative management of flares with lidocaine jelly (rx sent), sitz bath and regular BM (has not needed Miralax for daily BM)  Influenza A Swab influenza A +. Outside window for Tamiflu given symptom onset 3-4 days ago.  Supportive care advised with honey, Cepacol lozenges and chloraseptic throat spray for sore throat. Continue Mucinex as needed.  Allergic rhinitis Year round allergies worsen in the last few days with increasing pollen counts. Endorses irritated, itchy eyes and clear rhinorrhea. Endorses taking Zyrtec occasionally -Start Flonase 2 sprays in each nostril daily -Pataday eye drops for allergic conjunctivitis    Dr. Colletta Maryland, [place]

## 2022-05-13 ENCOUNTER — Telehealth: Payer: Self-pay

## 2022-05-13 DIAGNOSIS — J101 Influenza due to other identified influenza virus with other respiratory manifestations: Secondary | ICD-10-CM | POA: Insufficient documentation

## 2022-05-13 DIAGNOSIS — J309 Allergic rhinitis, unspecified: Secondary | ICD-10-CM | POA: Insufficient documentation

## 2022-05-13 NOTE — Assessment & Plan Note (Addendum)
Recurrent hemorrhoid flares since the birth of her son 9 years ago. Seen by General Surgery in 2022, did not pursue intervention at that time but patient would like second opinion. Declined rectal exam today. -Referral to General Surgery -Conservative management of flares with lidocaine jelly (rx sent), sitz bath and regular BM (has not needed Miralax for daily BM)

## 2022-05-13 NOTE — Assessment & Plan Note (Signed)
Swab influenza A +. Outside window for Tamiflu given symptom onset 3-4 days ago. Supportive care advised with honey, Cepacol lozenges and chloraseptic throat spray for sore throat. Continue Mucinex as needed.

## 2022-05-13 NOTE — Telephone Encounter (Signed)
Spoke with patient about positive Influenza A result. Attempted to call yesterday but patient unavailable. Reports continued symptoms of cough and congestion but no further fevers. 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 Will provide work note for time off today and tomorrow to recover and recommend wearing a mask at work if able.  Colletta Maryland, DO

## 2022-05-13 NOTE — Telephone Encounter (Signed)
Patient calls nurse line in regards to results.   She reports she saw the positive flu on my chart.   She reports she did not report for work today. She denies any fevers at this time.   She is requesting a note for work and would like to know how long she should remain out of work this week. 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   Will forward to provider who saw patient.

## 2022-05-13 NOTE — Assessment & Plan Note (Addendum)
Year round allergies worsen in the last few days with increasing pollen counts. Endorses irritated, itchy eyes and clear rhinorrhea. Endorses taking Zyrtec occasionally -Start Flonase 2 sprays in each nostril daily -Pataday eye drops for allergic conjunctivitis

## 2022-07-04 ENCOUNTER — Other Ambulatory Visit: Payer: Self-pay | Admitting: Family Medicine

## 2022-07-04 DIAGNOSIS — J309 Allergic rhinitis, unspecified: Secondary | ICD-10-CM

## 2022-07-10 ENCOUNTER — Ambulatory Visit (INDEPENDENT_AMBULATORY_CARE_PROVIDER_SITE_OTHER): Payer: BC Managed Care – PPO

## 2022-07-10 DIAGNOSIS — Z3042 Encounter for surveillance of injectable contraceptive: Secondary | ICD-10-CM | POA: Diagnosis not present

## 2022-07-10 MED ORDER — MEDROXYPROGESTERONE ACETATE 150 MG/ML IM SUSY
150.0000 mg | PREFILLED_SYRINGE | Freq: Once | INTRAMUSCULAR | Status: AC
Start: 2022-07-10 — End: 2022-07-10
  Administered 2022-07-10: 150 mg via INTRAMUSCULAR

## 2022-07-10 NOTE — Progress Notes (Signed)
Patient here today for Depo Provera injection and is within her dates.    Last contraceptive appt was 04/24/22  Depo given in [place] today.  Site unremarkable & patient tolerated injection.    Next injection due 09/25/22-10/09/22.  Reminder card given.    Veronda Prude, RN

## 2022-08-01 ENCOUNTER — Ambulatory Visit: Payer: BC Managed Care – PPO | Admitting: Family Medicine

## 2022-08-01 VITALS — BP 126/71 | HR 73 | Ht 63.0 in | Wt 151.8 lb

## 2022-08-01 DIAGNOSIS — M25472 Effusion, left ankle: Secondary | ICD-10-CM | POA: Diagnosis not present

## 2022-08-01 DIAGNOSIS — M25572 Pain in left ankle and joints of left foot: Secondary | ICD-10-CM

## 2022-08-01 MED ORDER — DICLOFENAC SODIUM 1 % EX GEL
2.0000 g | Freq: Four times a day (QID) | CUTANEOUS | 1 refills | Status: AC
Start: 2022-08-01 — End: ?

## 2022-08-01 NOTE — Patient Instructions (Signed)
It was wonderful to see you today.  Please bring ALL of your medications with you to every visit.   Today we talked about:  Left ankle pain: most likely soft tissue swelling from overuse. You can use voltaren gel (which I prescribed) and 600 mg ibuprofen 4 times a day to help with the pain. Try to ice it intermittently and do range of motion exercises like drawing the alphabet with your foot.   Follow up with me on July 1st.   Thank you for choosing [facility].   Please call [phone] with any questions about today's appointment.  Lockie Mola, MD  Family Medicine

## 2022-08-01 NOTE — Progress Notes (Unsigned)
    SUBJECTIVE:   CHIEF COMPLAINT / HPI:   Ankle pain  Patient says that she started having ankle pain about a week ago.  She said that she started having some swelling in her inner ankle.  She denies any trauma to the area.  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  She has never had an ankle injury on this left ankle before.  She does not have history of joint pain in any other joints.  Does not have a history of recurring fevers.  Denies any fever during this episode.  She says she is able to put weight on that ankle.  Has not progressed.  No family history of autoimmune disorders.  PERTINENT  PMH / PSH: Hemorrhoids, GERD  OBJECTIVE:   BP 126/71 (BP Location: Left Arm, Patient Position: Sitting, Cuff Size: Normal)   Pulse 73   Ht 5\' 3"  (1.6 m)   Wt 151 lb 12.8 oz (68.9 kg)   SpO2 100%   BMI 26.89 kg/m   General: well appearing, in no acute distress CV: RRR, radial pulses equal and palpable, no BLE edema  Resp: Normal work of breathing on room air, CTAB Abd: Soft, non tender, non distended  Neuro: Alert & Oriented x 4  MSK: Left ankle with mild medial malleolar are edema, no erythema, mild pain to palpation, able to put weight on it without intense pain, right ankle without any abnormalities   ASSESSMENT/PLAN:     Pain and swelling of left ankle Assessment & Plan: Soft tissue inflammation of the left ankle most likely due to overuse from minor trauma that patient did not notice.  Unlikely fracture as patient is able to bear weight without much pain.  Unlikely infection as not erythematous or intensely painful.  Most likely benign no indication for autoimmune causes at this time. - Encourage ice, Voltaren gel, ibuprofen as needed - Follow-up in 1 month       Lockie Mola, MD [facility]

## 2022-08-04 DIAGNOSIS — M25472 Effusion, left ankle: Secondary | ICD-10-CM | POA: Insufficient documentation

## 2022-08-04 NOTE — Assessment & Plan Note (Signed)
Soft tissue inflammation of the left ankle most likely due to overuse from minor trauma that patient did not notice.  Unlikely fracture as patient is able to bear weight without much pain.  Unlikely infection as not erythematous or intensely painful.  Most likely benign no indication for autoimmune causes at this time. - Encourage ice, Voltaren gel, ibuprofen as needed - Follow-up in 1 month

## 2022-08-06 ENCOUNTER — Ambulatory Visit: Payer: BC Managed Care – PPO | Admitting: Family Medicine

## 2022-08-06 ENCOUNTER — Other Ambulatory Visit: Payer: Self-pay

## 2022-08-06 VITALS — BP 137/72 | HR 60 | Ht 63.0 in | Wt 154.2 lb

## 2022-08-06 DIAGNOSIS — R21 Rash and other nonspecific skin eruption: Secondary | ICD-10-CM

## 2022-08-06 DIAGNOSIS — W57XXXA Bitten or stung by nonvenomous insect and other nonvenomous arthropods, initial encounter: Secondary | ICD-10-CM

## 2022-08-06 NOTE — Progress Notes (Signed)
    SUBJECTIVE:   CHIEF COMPLAINT / HPI:   Patient presents for rash on back   Started Friday and itches. Has tried Hydrocortisone cream and helped a little. Not painful.  Not worsening or improving  Was outside that day so possible she was bitten   PERTINENT  PMH / PSH: Reviewed   OBJECTIVE:   BP 137/72   Pulse 60   Ht 5\' 3"  (1.6 m)   Wt 154 lb 3.2 oz (69.9 kg)   SpO2 100%   BMI 27.32 kg/m    Physical exam General: well appearing, NAD Cardiovascular: RRR, no murmurs Lungs: CTAB. Normal WOB Abdomen: soft, non-distended, non-tender Skin: warm, dry.  3 erythematous papules around back area. No drainage. Does not appear infected   ASSESSMENT/PLAN:   Bug bite Patient presents with concern for rash that itches on her back that she noticed about 5 days ago.  On exam 3 erythematous papules noted around back area likely due to a bug/mosquito bite.  Discussed symptomatic management with steroid cream if it helps with the itching, vaseline a couple times a day to keep it moisturized. Apply a cold compress. Also recommended using Zyrtec or Benadryl for the itching as well as calamine lotion or aloe vera.     Cora Collum, DO [facility]

## 2022-08-06 NOTE — Patient Instructions (Signed)
It was great seeing you today!  The bumps on your back look like bug/mosquito bites. You can use the steroid cream if it helps with the itching. Use vaseline a couple times a day to keep it moisturized. Apply a cold compress. You can also use Zyrtec or Benadryl for the itching as well. Calamine lotion or aloe vera can also help  Feel free to call with any questions or concerns at any time, at [phone].   Take care,  Dr. Cora Collum [facility]

## 2022-08-06 NOTE — Assessment & Plan Note (Signed)
Patient presents with concern for rash that itches on her back that she noticed about 5 days ago.  On exam 3 erythematous papules noted around back area likely due to a bug/mosquito bite.  Discussed symptomatic management with steroid cream if it helps with the itching, vaseline a couple times a day to keep it moisturized. Apply a cold compress. Also recommended using Zyrtec or Benadryl for the itching as well as calamine lotion or aloe vera.

## 2022-08-08 ENCOUNTER — Other Ambulatory Visit (HOSPITAL_COMMUNITY): Payer: Self-pay

## 2022-08-08 ENCOUNTER — Telehealth: Payer: Self-pay

## 2022-08-08 NOTE — Telephone Encounter (Signed)
Prior Auth for patients medication DICLOFENAC GEL denied by [facility] via CoverMyMeds.   Reason: Your plan only covers this drug when it is used for certain health conditions. Covered use is for osteoarthritis pain in joints susceptible to topical treatment such as feet, ankles, knees, hands, wrists, or elbows.  Voltaren gel available OTC  CoverMyMeds Key: [id number] PA Case ID:  [id number]

## 2022-08-08 NOTE — Telephone Encounter (Signed)
A Prior Authorization was initiated for this patients DICLOFENAC GEL through CoverMyMeds.   Key: [id number]

## 2022-08-10 ENCOUNTER — Encounter: Payer: Self-pay | Admitting: Family Medicine

## 2022-08-11 ENCOUNTER — Ambulatory Visit: Payer: Self-pay | Admitting: Family Medicine

## 2022-09-11 ENCOUNTER — Ambulatory Visit: Payer: BC Managed Care – PPO | Admitting: Family Medicine

## 2022-09-11 ENCOUNTER — Other Ambulatory Visit (HOSPITAL_COMMUNITY): Admission: RE | Admit: 2022-09-11 | Payer: BC Managed Care – PPO | Source: Ambulatory Visit | Admitting: Family Medicine

## 2022-09-11 VITALS — BP 135/79 | HR 74 | Ht 63.0 in | Wt 153.0 lb

## 2022-09-11 DIAGNOSIS — Z113 Encounter for screening for infections with a predominantly sexual mode of transmission: Secondary | ICD-10-CM | POA: Insufficient documentation

## 2022-09-11 DIAGNOSIS — R61 Generalized hyperhidrosis: Secondary | ICD-10-CM

## 2022-09-11 DIAGNOSIS — B9689 Other specified bacterial agents as the cause of diseases classified elsewhere: Secondary | ICD-10-CM

## 2022-09-11 DIAGNOSIS — Z124 Encounter for screening for malignant neoplasm of cervix: Secondary | ICD-10-CM

## 2022-09-11 DIAGNOSIS — N760 Acute vaginitis: Secondary | ICD-10-CM

## 2022-09-11 DIAGNOSIS — N898 Other specified noninflammatory disorders of vagina: Secondary | ICD-10-CM

## 2022-09-11 LAB — POCT WET PREP (WET MOUNT)
Clue Cells Wet Prep Whiff POC: POSITIVE
Trichomonas Wet Prep HPF POC: ABSENT
WBC, Wet Prep HPF POC: NONE SEEN

## 2022-09-11 MED ORDER — METRONIDAZOLE 500 MG PO TABS: 500.0000 mg | ORAL_TABLET | Freq: Two times a day (BID) | ORAL | 0 refills | Status: AC

## 2022-09-11 NOTE — Assessment & Plan Note (Signed)
Due to recent relationship issues patient would like to get STD screening done today. Had unprotected intercourse one week ago. - Cytology Pap w/ STD screening

## 2022-09-11 NOTE — Addendum Note (Signed)
Addended by: [name] on: 09/11/2022 01:47 PM   Modules accepted: Orders

## 2022-09-11 NOTE — Assessment & Plan Note (Addendum)
Vaginal discharge has been cloudy white, slightly pink, and odorous. Which she also relates to sweating profusely in the area. - Wet Prep - Discussed keeping area clean and dry, changing out razors more frequently due to causing more irritation, and trying topical OTC treatments if necessary

## 2022-09-11 NOTE — Assessment & Plan Note (Addendum)
Patient presented with concerns of vaginal discharge and odor. Tested positive for BV with a few clue cells present and positive whiff test.  - Flagyl 500 mg BID x 7 days

## 2022-09-11 NOTE — Assessment & Plan Note (Addendum)
Due for pap smear 6 months s/p LEEP procedure on 04/04/21, which is overdue. - Pap Smear - Discussed following up with OBGYN s/p LEEP procedure as well, referral to gynecology

## 2022-09-11 NOTE — Patient Instructions (Addendum)
It was great to see you today! Thank you for choosing [facility] for your primary care. Cindy Armstrong was seen for Pap and STD testing.  Today we addressed: Pap smear and STD screening Vaginal Discharge and Sweating - Make sure you keep your genital area clean and dry as much as possible. - Could try applying salicylic acid (Acne treatment) topically. - Make sure you change out your razors frequently as well to avoid excessive irritation.  If you haven't already, sign up for My Chart to have easy access to your labs results, and communication with your primary care physician.  I would also recommend you follow up with you OBGYN, Dr. Ladean Raya, so they can follow up after the LEEP procedure as well. We will get back to you with your Pap results.   You should return to our clinic No follow-ups on file. Please arrive 15 minutes before your appointment to ensure smooth check in process.  We appreciate your efforts in making this happen.  Thank you for allowing me to participate in your care, [name], DO 09/11/2022, 9:26 AM PGY-1, [facility]

## 2022-09-11 NOTE — Progress Notes (Addendum)
    SUBJECTIVE:   CHIEF COMPLAINT / HPI: Pap/STD Testing  Patient presents today to get STD testing. She reports that her boyfriend cheated on her. Last reported time she had unprotected intercourse was 1 week ago. She received her last Depo shot in May, next injection due 09/25/22-10/09/22. She endorses odor, vaginal discharge that has been cloudy white and a little pink, and sweating in her genital area and was inquiring about any topical ointments/medications that could help with this because she has been getting boils under her skin in the area which are painful. She has noticed similar boils under her armpits as well. Shaves frequently.  Patient would like to get her repeat pap smear today as well s/p LEEP procedure.   PERTINENT  PMH / PSH: Anemia  OBJECTIVE:   BP 135/79   Pulse 74   Ht 5\' 3"  (1.6 m)   Wt 153 lb (69.4 kg)   SpO2 100%   BMI 27.10 kg/m   General: NAD, awake and alert Cardiovascular: RRR. No M/R/G Respiratory: CTAB, normal WOB on RA. No wheezing, crackles, or rhonchi. Abdomen: soft, non-tender, non-distended. Bowel sounds normoactive Extremities: no BLE edema Neuro: A&Ox3. No focal neurological deficits.  GU: friable tissue noted likely s/p LEEP procedure, cervix pink, some bleeding, boil noted   ASSESSMENT/PLAN:   Screening for cervical cancer Due for pap smear 6 months s/p LEEP procedure on 04/04/21, which is overdue. - Pap Smear - Discussed following up with OBGYN s/p LEEP procedure as well, referral to gynecology  Vaginal discharge Vaginal discharge has been cloudy white, slightly pink, and odorous. Which she also relates to sweating profusely in the area. - Wet Prep - Discussed keeping area clean and dry, changing out razors more frequently due to causing more irritation, and trying topical OTC treatments if necessary  Screening examination for STD (sexually transmitted disease) Due to recent relationship issues patient would like to get STD screening  done today. Had unprotected intercourse one week ago. - Cytology Pap w/ STD screening   Bacterial vaginosis Patient presented with concerns of vaginal discharge and odor. Tested positive for BV with a few clue cells present and positive whiff test.  - Flagyl 500 mg BID x 7 days    [name], DO [facility]

## 2022-10-30 ENCOUNTER — Encounter: Payer: Self-pay | Admitting: Family Medicine

## 2022-10-30 ENCOUNTER — Ambulatory Visit: Payer: BC Managed Care – PPO | Admitting: Family Medicine

## 2022-10-30 VITALS — BP 136/88 | HR 76 | Ht 63.0 in | Wt 156.0 lb

## 2022-10-30 DIAGNOSIS — Z3042 Encounter for surveillance of injectable contraceptive: Secondary | ICD-10-CM | POA: Diagnosis not present

## 2022-10-30 LAB — POCT URINE PREGNANCY: Preg Test, Ur: NEGATIVE

## 2022-10-30 MED ORDER — MEDROXYPROGESTERONE ACETATE 150 MG/ML IM SUSY
150.0000 mg | PREFILLED_SYRINGE | Freq: Once | INTRAMUSCULAR | Status: DC
Start: 2022-10-30 — End: 2023-02-06

## 2022-10-30 MED ORDER — MEDROXYPROGESTERONE ACETATE 150 MG/ML IM SUSY
150.0000 mg | PREFILLED_SYRINGE | Freq: Once | INTRAMUSCULAR | Status: AC
Start: 2022-10-30 — End: 2022-10-30
  Administered 2022-10-30: 150 mg via INTRAMUSCULAR

## 2022-10-30 NOTE — Patient Instructions (Addendum)
It was great to see you today! Here's what we talked about:  Your pregnancy test was negative. We will update your depo shot today. Please be sure to come back between 12/5-12/19 for your next dose. Please take a pregnancy test at home in 2 weeks and message me with the result. That way, we can be sure to give you resources for moving forward.  Please let me know if you have any other questions.  Dr. Phineas Real

## 2022-10-30 NOTE — Assessment & Plan Note (Addendum)
Depo given again today given negative upreg. Given not reasonably sure she is not pregnant, advised to retake home upreg and send me results, though depo would not hurt fetus. Can provide resources if needed at that time given pregnancy not desired. Follow up 12/5-12/19 for next dose.

## 2022-10-30 NOTE — Progress Notes (Signed)
    SUBJECTIVE:   CHIEF COMPLAINT / HPI:   Missed depo Last dose due 8/15-8/29. She is nearly 3 weeks out. She was unaware she is passed dates. She is unsure of her LMP given it is not regular on depo. Last sexual encounter 9/17 without protection. Took a pregnancy test at home that was negative.  PERTINENT  PMH / PSH: hemorrhoids, GERD, BV, trichomonas  OBJECTIVE:   BP 136/88   Pulse 76   Ht 5\' 3"  (1.6 m)   Wt 156 lb (70.8 kg)   SpO2 100%   BMI 27.63 kg/m   General: Alert and oriented, in NAD Skin: Warm, dry, and intact without lesions HEENT: NCAT, EOM grossly normal, midline nasal septum Cardiac: RRR, no m/r/g appreciated Respiratory: CTAB, breathing and speaking comfortably on RA Abdominal: Soft, nontender, nondistended, normoactive bowel sounds Extremities: Moves all extremities grossly equally Neurological: No gross focal deficit Psychiatric: Appropriate mood and affect   ASSESSMENT/PLAN:   Encounter for Depo-Provera contraception Depo given again today given negative upreg. Given not reasonably sure she is not pregnant, advised to retake home upreg and send me results, though depo would not hurt fetus. Can provide resources if needed at that time given pregnancy not desired. Follow up 12/5-12/19 for next dose.   Health maintenance Patient declined flu and covid vaccines at this time.  Cindy Holmes, MD [facility]

## 2022-10-30 NOTE — Progress Notes (Signed)
Depo Provera injection given in [facility] without complication.   Next dates due 01/15/2023-01/29/2023.  Reminder card given.

## 2022-11-03 ENCOUNTER — Encounter: Payer: Self-pay | Admitting: Family Medicine

## 2023-02-05 NOTE — Progress Notes (Signed)
    SUBJECTIVE:   CHIEF COMPLAINT / HPI: STD testing  Cindy Armstrong comes in with concerns of having BV due to vaginal odor and itching for about 2-3 weeks. She reports having BV in October 2024 when she went to see a GYN. She has been sexually active, last active 2 days ago with same partner in a monogamous partner. Tried GynoCare gel from [place] which helped some.  Still on birth control but switched from Depo to [facility] patch by GYN. Was advised to put on patch on 12/19 or 12/20 which is a time when she was due for her Depo shot.  The patch fell off and she has not been on the patch for a week and had unprotected sex 2 days ago.  LMP was at the beginning of December.  PERTINENT  PMH / PSH: anemia  OBJECTIVE:   BP (!) 125/55   Pulse 80   Ht 5\' 3"  (1.6 m)   Wt 167 lb 6.4 oz (75.9 kg)   SpO2 100%   BMI 29.65 kg/m   General: Awake and Alert in NAD HEENT: NCAT. Sclera anicteric. No rhinorrhea. Extremities: Able to move all extremities equally. No BLE edema, no deformities or significant joint findings. Skin: Warm and dry. No abrasions or rashes noted. Neuro: A&Ox3. No focal neurological deficits. F GU: Normally developed genitalia with no external lesions or eruptions. Vagina and cervix show no lesions, inflammation, discharge or tenderness. No cystocele. No pain with examination. Chaperoned by CMA Misty Stanley.   ASSESSMENT/PLAN:   Assessment & Plan Screen for STD (sexually transmitted disease) Vaginal odor and itching for 2-3 weeks with history of BV in the past. - STD screening done today, we will follow-up with results Encounter for counseling regarding contraception Patient was recently switched from Depo to [facility] patch by GYN.  LMP was at the beginning of December.  Had unprotected sex 2 days ago while not being on birth control for a week. - Advised patient to return on 02/18/2023 to test for pregnancy - Encouraged using protection if she plans to have sex vs the low risk with  using Xulane patch for an early pregnancy.  Patient understood.  [name], DO [facility]

## 2023-02-06 ENCOUNTER — Encounter: Payer: Self-pay | Admitting: Family Medicine

## 2023-02-06 ENCOUNTER — Ambulatory Visit: Payer: BC Managed Care – PPO | Admitting: Family Medicine

## 2023-02-06 ENCOUNTER — Other Ambulatory Visit (HOSPITAL_COMMUNITY)
Admission: RE | Admit: 2023-02-06 | Discharge: 2023-02-06 | Disposition: A | Discharge status: Home / Self Care | Payer: BC Managed Care – PPO | Source: Ambulatory Visit | Attending: Family Medicine | Admitting: Family Medicine

## 2023-02-06 VITALS — BP 125/55 | HR 80 | Ht 63.0 in | Wt 167.4 lb

## 2023-02-06 DIAGNOSIS — Z113 Encounter for screening for infections with a predominantly sexual mode of transmission: Secondary | ICD-10-CM

## 2023-02-06 DIAGNOSIS — N760 Acute vaginitis: Secondary | ICD-10-CM | POA: Insufficient documentation

## 2023-02-06 DIAGNOSIS — Z3009 Encounter for other general counseling and advice on contraception: Secondary | ICD-10-CM

## 2023-02-06 DIAGNOSIS — B9689 Other specified bacterial agents as the cause of diseases classified elsewhere: Secondary | ICD-10-CM | POA: Insufficient documentation

## 2023-02-06 NOTE — Patient Instructions (Addendum)
It was great to see you today! Thank you for choosing [facility] for your primary care. Cindy Armstrong was seen for STD testing.  Today we addressed: STD testing performed today. Will relay your results through MyChart. Xulane patch - since your patch fell off a day after you put it on and you haven't had it on for one week with having unprotected sex two days ago there could be a chance you are pregnant. Options listed below Avoid sex or use condoms for the next 2 week prior to testing for pregnancy. Put Xulane patch on your patch switch day to stay consist with low risk to early pregnancy with doing so, if you are pregnant.  Lab visit in 2 weeks to test for pregnancy.  If you haven't already, sign up for My Chart to have easy access to your labs results, and communication with your primary care physician.  STD testing performed today will relay the results via MyChart per your preference.   You should return to our clinic Return in 12 days (on 02/18/2023) for lab visit for pregnancy test. Please arrive 15 minutes before your appointment to ensure smooth check in process.  We appreciate your efforts in making this happen.  Thank you for allowing me to participate in your care, [name], DO 02/06/2023, 11:51 AM PGY-1, [facility]

## 2023-02-09 ENCOUNTER — Other Ambulatory Visit: Payer: Self-pay | Admitting: Family Medicine

## 2023-02-09 DIAGNOSIS — B9689 Other specified bacterial agents as the cause of diseases classified elsewhere: Secondary | ICD-10-CM

## 2023-02-09 LAB — CERVICOVAGINAL ANCILLARY ONLY
Bacterial Vaginitis (gardnerella): POSITIVE — AB
Candida Glabrata: NEGATIVE
Candida Vaginitis: NEGATIVE
Chlamydia: NEGATIVE
Comment: NEGATIVE
Comment: NEGATIVE
Comment: NEGATIVE
Comment: NEGATIVE
Comment: NEGATIVE
Comment: NORMAL
Neisseria Gonorrhea: NEGATIVE
Trichomonas: NEGATIVE

## 2023-02-09 MED ORDER — METRONIDAZOLE 500 MG PO TABS
500.0000 mg | ORAL_TABLET | Freq: Two times a day (BID) | ORAL | 0 refills | Status: AC
Start: 2023-02-09 — End: 2023-02-16

## 2023-02-18 ENCOUNTER — Other Ambulatory Visit (INDEPENDENT_AMBULATORY_CARE_PROVIDER_SITE_OTHER): Payer: 59

## 2023-02-18 ENCOUNTER — Encounter: Payer: Self-pay | Admitting: Family Medicine

## 2023-02-18 DIAGNOSIS — Z3045 Encounter for surveillance of transdermal patch hormonal contraceptive device: Secondary | ICD-10-CM | POA: Diagnosis not present

## 2023-02-18 LAB — POCT URINE PREGNANCY: Preg Test, Ur: NEGATIVE

## 2023-02-18 NOTE — Progress Notes (Signed)
 Patient to come in for a urine pregnancy test since she recently switched birth controls and had unprotected intercourse and the [place] patch wasn't staying on. Order placed.   [name], DO [facility], PGY-1 02/18/23 8:08 AM

## 2023-02-19 ENCOUNTER — Encounter: Payer: Self-pay | Admitting: Family Medicine

## 2023-02-23 ENCOUNTER — Ambulatory Visit (INDEPENDENT_AMBULATORY_CARE_PROVIDER_SITE_OTHER): Payer: 59 | Admitting: Student

## 2023-02-23 ENCOUNTER — Encounter: Payer: Self-pay | Admitting: Student

## 2023-02-23 VITALS — BP 137/87 | HR 75 | Temp 97.5°F | Ht 62.0 in | Wt 166.0 lb

## 2023-02-23 DIAGNOSIS — K219 Gastro-esophageal reflux disease without esophagitis: Secondary | ICD-10-CM

## 2023-02-23 DIAGNOSIS — B370 Candidal stomatitis: Secondary | ICD-10-CM | POA: Diagnosis not present

## 2023-02-23 LAB — POCT URINE PREGNANCY: Preg Test, Ur: NEGATIVE

## 2023-02-23 MED ORDER — FLUCONAZOLE 150 MG PO TABS: 150.0000 mg | ORAL_TABLET | Freq: Once | ORAL | 0 refills | Status: AC

## 2023-02-23 NOTE — Patient Instructions (Signed)
 It was great to see you! Thank you for allowing me to participate in your care!  I recommend that you always bring your medications to each appointment as this makes it easy to ensure we are on the correct medications and helps us  not miss when refills are needed.  Our plans for today:  - Thrush I think you have an oral yeast infection, that is causing your throat to be dry and irritated. I'm giving you a medicine to be taken once, that should clear it up, but you can repeat 3 days later, if not better.  If not better in 1 week, call clinic and leave message for me, asking for new prescription to help (oral solution)   - GERD Take your famotidine  twice a day, every day. If symptoms not better after 2 weeks, make a follow up appointment to be seen. We may need to adjust medicine.   We are checking some labs today, I will call you if they are abnormal will send you a MyChart message or a letter if they are normal.  If you do not hear about your labs in the next 2 weeks please let us  know.  Take care and seek immediate care sooner if you develop any concerns.   Dr. Penne Rhein, MD [facility]

## 2023-02-23 NOTE — Assessment & Plan Note (Addendum)
 Patient reports that he is having GERD symptoms, worse at night, with burning in her upper throat/chest.  Patient appreciates she is inconsistently taking her famotidine , but is trying to take it more regularly now.  Will recommend patient trial famotidine  regularly, and if not improving will recommend escalation of therapy. - Continue famotidine  - If GERD not better in 2 weeks, consider escalation to PPI

## 2023-02-23 NOTE — Progress Notes (Signed)
  SUBJECTIVE:   CHIEF COMPLAINT / HPI:   Knott in throat Has had nausea since over a week ago, following Tx for BV. Last dose was last Wednesday, starting Thursday has had knott in throat, that's dry and irritated. On Friday it felt like it was closing, eventually stopped, with drinking water. Taste buds have been off as well. Still having nausea, no abd pain and regular BM.   Usees patch for [facility] and still get's some cycle/spotting. Pt w/ negative pregnancy test last Wednesday, but has been sexually active since then.    Reflux is bothering Taking famotidine  off and on, and appreciates burning in throat at night. Is trying to take more regularly.  PERTINENT  PMH / PSH:    OBJECTIVE:  BP 137/87   Pulse 75   Temp (!) 97.5 F (36.4 C) (Oral)   Ht 5' 2 (1.575 m)   Wt 166 lb (75.3 kg)   SpO2 100%   BMI 30.36 kg/m  Physical Exam HENT:     Mouth/Throat:     Mouth: Mucous membranes are moist. Oral lesions present.     Pharynx: Uvula midline. Oropharyngeal exudate present. No pharyngeal swelling, posterior oropharyngeal erythema or uvula swelling.     Comments: White coating overlying entire tongue, can be scrapped away     ASSESSMENT/PLAN:   Assessment & Plan Thrush Patient comes in with throat irritation that began over a week ago.  Patient appreciates that she recently was treated for BV, and finished medications on Wednesday, starting Thursday she started having knots in her throat, and throat was feeling dry and irritated.  On Friday she felt like her throat was closing, but this stopped, still having some nausea.  She denies any fevers or systemic symptoms.  On physical exam throat is clear with white coating on tongue extending all the way back of tongue.  Low concern for anaphylactic reaction, given that patient has no allergies, and throat exam benign, patient most likely suffering from oral candidiasis we will treat for thrush. Urine preg test negative, will treat w/  diflucan . - Diflucan  x 2, to be taken 72 hours apart, if needed - Consider oral nystatin if no improvement Gastroesophageal reflux disease without esophagitis Patient reports that he is having GERD symptoms, worse at night, with burning in her upper throat/chest.  Patient appreciates she is inconsistently taking her famotidine , but is trying to take it more regularly now.  Will recommend patient trial famotidine  regularly, and if not improving will recommend escalation of therapy. - Continue famotidine  - If GERD not better in 2 weeks, consider escalation to PPI No follow-ups on file. Penne Rhein, MD 02/23/2023, 8:54 AM PGY-3, [facility]

## 2023-02-23 NOTE — Assessment & Plan Note (Addendum)
 Patient comes in with throat irritation that began over a week ago.  Patient appreciates that she recently was treated for BV, and finished medications on Wednesday, starting Thursday she started having knots in her throat, and throat was feeling dry and irritated.  On Friday she felt like her throat was closing, but this stopped, still having some nausea.  She denies any fevers or systemic symptoms.  On physical exam throat is clear with white coating on tongue extending all the way back of tongue.  Low concern for anaphylactic reaction, given that patient has no allergies, and throat exam benign, patient most likely suffering from oral candidiasis we will treat for thrush. Urine preg test negative, will treat w/ diflucan . - Diflucan  x 2, to be taken 72 hours apart, if needed - Consider oral nystatin if no improvement

## 2023-03-03 ENCOUNTER — Ambulatory Visit (INDEPENDENT_AMBULATORY_CARE_PROVIDER_SITE_OTHER): Payer: Self-pay | Admitting: Student

## 2023-03-03 VITALS — BP 120/80 | HR 79 | Ht 62.0 in | Wt 163.8 lb

## 2023-03-03 DIAGNOSIS — M79604 Pain in right leg: Secondary | ICD-10-CM

## 2023-03-03 MED ORDER — GABAPENTIN 100 MG PO CAPS: 100.0000 mg | ORAL_CAPSULE | Freq: Three times a day (TID) | ORAL | 3 refills | Status: AC

## 2023-03-03 NOTE — Patient Instructions (Signed)
Nice to meet you.  Suspect your right leg pain is most likely due to sciatica given that you are also having back pain.  I have refilled your gabapentin which you should take as prescribed.  Also I have provided you with list of back exercises to help with your back.  It is also important that you apply good lifting or positional technique when working this could help with your back pain.  Should your back pain continue to get worse please return so that you can be reevaluated.  If you start noticing any urinary incontinence or bowel incontinence, or sensation in your pelvic area please go to the ED.  For your IUD placement please schedule appointment for placement.    Back Exercises These exercises help to make your trunk and back strong. They also help to keep the lower back flexible. Doing these exercises can help to prevent or lessen pain in your lower back. If you have back pain, try to do these exercises 2-3 times each day or as told by your doctor. As you get better, do the exercises once each day. Repeat the exercises more often as told by your doctor. To stop back pain from coming back, do the exercises once each day, or as told by your doctor. Do exercises exactly as told by your doctor. Stop right away if you feel sudden pain or your pain gets worse. Exercises Single knee to chest Do these steps 3-5 times in a row for each leg: Lie on your back on a firm bed or the floor with your legs stretched out. Bring one knee to your chest. Grab your knee or thigh with both hands and hold it in place. Pull on your knee until you feel a gentle stretch in your lower back or butt. Keep doing the stretch for 10-30 seconds. Slowly let go of your leg and straighten it. Pelvic tilt Do these steps 5-10 times in a row: Lie on your back on a firm bed or the floor with your legs stretched out. Bend your knees so they point up to the ceiling. Your feet should be flat on the floor. Tighten your  lower belly (abdomen) muscles to press your lower back against the floor. This will make your tailbone point up to the ceiling instead of pointing down to your feet or the floor. Stay in this position for 5-10 seconds while you gently tighten your muscles and breathe evenly. Cat-cow Do these steps until your lower back bends more easily: Get on your hands and knees on a firm bed or the floor. Keep your hands under your shoulders, and keep your knees under your hips. You may put padding under your knees. Let your head hang down toward your chest. Tighten (contract) the muscles in your belly. Point your tailbone toward the floor so your lower back becomes rounded like the back of a cat. Stay in this position for 5 seconds. Slowly lift your head. Let the muscles of your belly relax. Point your tailbone up toward the ceiling so your back forms a sagging arch like the back of a cow. Stay in this position for 5 seconds.  Press-ups Do these steps 5-10 times in a row: Lie on your belly (face-down) on a firm bed or the floor. Place your hands near your head, about shoulder-width apart. While you keep your back relaxed and keep your hips on the floor, slowly straighten your arms to raise the top half of your body and lift your  shoulders. Do not use your back muscles. You may change where you place your hands to make yourself more comfortable. Stay in this position for 5 seconds. Keep your back relaxed. Slowly return to lying flat on the floor.  Bridges Do these steps 10 times in a row: Lie on your back on a firm bed or the floor. Bend your knees so they point up to the ceiling. Your feet should be flat on the floor. Your arms should be flat at your sides, next to your body. Tighten your butt muscles and lift your butt off the floor until your waist is almost as high as your knees. If you do not feel the muscles working in your butt and the back of your thighs, slide your feet 1-2 inches (2.5-5 cm)  farther away from your butt. Stay in this position for 3-5 seconds. Slowly lower your butt to the floor, and let your butt muscles relax. If this exercise is too easy, try doing it with your arms crossed over your chest. Belly crunches Do these steps 5-10 times in a row: Lie on your back on a firm bed or the floor with your legs stretched out. Bend your knees so they point up to the ceiling. Your feet should be flat on the floor. Cross your arms over your chest. Tip your chin a little bit toward your chest, but do not bend your neck. Tighten your belly muscles and slowly raise your chest just enough to lift your shoulder blades a tiny bit off the floor. Avoid raising your body higher than that because it can put too much stress on your lower back. Slowly lower your chest and your head to the floor. Back lifts Do these steps 5-10 times in a row: Lie on your belly (face-down) with your arms at your sides, and rest your forehead on the floor. Tighten the muscles in your legs and your butt. Slowly lift your chest off the floor while you keep your hips on the floor. Keep the back of your head in line with the curve in your back. Look at the floor while you do this. Stay in this position for 3-5 seconds. Slowly lower your chest and your face to the floor. Contact a doctor if: Your back pain gets a lot worse when you do an exercise. Your back pain does not get better within 2 hours after you exercise. If you have any of these problems, stop doing the exercises. Do not do them again unless your doctor says it is okay. Get help right away if: You have sudden, very bad back pain. If this happens, stop doing the exercises. Do not do them again unless your doctor says it is okay. This information is not intended to replace advice given to you by your health care provider. Make sure you discuss any questions you have with your health care provider. Document Revised: 04/11/2020 Document Reviewed:  04/11/2020 Elsevier Patient Education  2024 [employer].

## 2023-03-03 NOTE — Progress Notes (Signed)
    SUBJECTIVE:   CHIEF COMPLAINT / HPI: Right leg pain  33 year old with history of sciatica Presenting with RLE pain Described pain as intermittent sharp shooting pain radiating from hip down to ankle. Pain started about 4 days ago and usually last about 30 minutes  Patient's deviously had sciatica on the left lower extremity and at that time treated with gabapentin. Patient restarted her gabapentin which she said has provided some relief of the pain. 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 Denies any urinary, bowel incontinence.  No saddle paresthesia.  PERTINENT  PMH / PSH: Reviewed  OBJECTIVE:   BP 120/80 (Cuff Size: Normal)   Pulse 79   Ht 5\' 2"  (1.575 m)   Wt 163 lb 12.8 oz (74.3 kg)   SpO2 98%   BMI 29.96 kg/m    Physical Exam General: Alert, well appearing, NAD Cardiovascular: RRR, No Murmurs, Normal S2/S2 Respiratory: CTAB, No wheezing or Rales Abdomen: No distension or tenderness Extremities: 5 out of 5 muscle strength in all extremities with sensation intact.  Notable deformity. MSK: Mild tenderness on palpation across the lower back   ASSESSMENT/PLAN:   Right lower extremity pain Patient's right lower extremity pain with associated back pain most likely due to sciatica.  Consider differential but less likely include bursitis, restless leg syndrome or peripheral neuropathy.  No red flag symptoms such as urinary or bowel incontinence or saddle paresthesia.  Exam have good strength with sensations intact in all extremities.  Given some relief with gabapentin, we will refill patient's medication. -Refilled gabapentin 100 mg 3 times daily -Provided patient with lower back exercises -Discussed proper bending and lifting technique with work -ED precautions discussed with patient -Follow-up in a month to reassess symptoms -Could consider imaging with worsening  symptoms.  Contraceptive Patient reports she is currently on the patch contraceptive however have any issue with it staying on.  Expressed desire to have IUD placed.  Previously has done well with Mirena other than issues with string getting loss.  Also tried Nexplanon in the past but had persistent bleeding. Patient will schedule appointment for IUD placement.  Jerre Simon, MD [facility]

## 2023-03-10 ENCOUNTER — Ambulatory Visit: Payer: Self-pay | Admitting: Student

## 2023-03-12 NOTE — Patient Instructions (Signed)
It was great to see you! Thank you for allowing me to participate in your care!  Our plans for today:  - IUD insertion canceled, plan to reschedule

## 2023-03-12 NOTE — Progress Notes (Signed)
  SUBJECTIVE:   CHIEF COMPLAINT / HPI:   IUD insertion Patient comes in for IUD insertion.  Patient appreciates that her period ended last Thursday, the last time she used her birth control patch was on that Friday, though it fell off Saturday, and she was sexually active on Saturday.    PERTINENT  PMH / PSH:    OBJECTIVE:  BP 138/73   Pulse 70   Ht 5\' 2"  (1.575 m)   Wt 164 lb 3.2 oz (74.5 kg)   SpO2 100%   BMI 30.03 kg/m  Physical Exam Constitutional:      Appearance: Normal appearance.  Pulmonary:     Effort: Pulmonary effort is normal. No respiratory distress.  Neurological:     Mental Status: She is alert.  Psychiatric:        Mood and Affect: Mood normal.        Behavior: Behavior normal.      ASSESSMENT/PLAN:   Assessment & Plan Encounter for counseling regarding contraception Patient comes in for IUD insertion however a we cannot reasonably be sure that patient is not pregnant thus we will have patient reschedule appointment.  Patient last menstrual period ended 7 days ago, and patient has not abstained from intercourse since last period. Discussed need to reschedule.  -reschedule IUD insertion No follow-ups on file. Bess Kinds, MD 03/13/2023, 3:02 PM PGY-3, [facility]

## 2023-03-13 ENCOUNTER — Encounter: Payer: Self-pay | Admitting: Student

## 2023-03-13 ENCOUNTER — Ambulatory Visit (INDEPENDENT_AMBULATORY_CARE_PROVIDER_SITE_OTHER): Payer: Self-pay | Admitting: Student

## 2023-03-13 VITALS — BP 138/73 | HR 70 | Ht 62.0 in | Wt 164.2 lb

## 2023-03-13 DIAGNOSIS — Z3009 Encounter for other general counseling and advice on contraception: Secondary | ICD-10-CM | POA: Insufficient documentation

## 2023-03-13 NOTE — Assessment & Plan Note (Addendum)
Patient comes in for IUD insertion however a we cannot reasonably be sure that patient is not pregnant thus we will have patient reschedule appointment.  Patient last menstrual period ended 7 days ago, and patient has not abstained from intercourse since last period. Discussed need to reschedule.  -reschedule IUD insertion

## 2023-04-14 ENCOUNTER — Ambulatory Visit: Payer: 59 | Admitting: Student

## 2023-04-14 ENCOUNTER — Ambulatory Visit: Payer: Self-pay | Admitting: Family Medicine

## 2023-04-16 ENCOUNTER — Ambulatory Visit: Admitting: Family Medicine

## 2023-04-16 ENCOUNTER — Encounter: Payer: Self-pay | Admitting: Family Medicine

## 2023-04-16 VITALS — BP 146/84 | HR 82 | Ht 62.0 in | Wt 165.8 lb

## 2023-04-16 DIAGNOSIS — Z309 Encounter for contraceptive management, unspecified: Secondary | ICD-10-CM

## 2023-04-16 LAB — POCT URINE PREGNANCY: Preg Test, Ur: NEGATIVE

## 2023-04-16 NOTE — Progress Notes (Signed)
 Patient presents for IUD insertion. LMP: Feb 17th- has had sexual intercourse since then. Last time was 2 days ago, used condoms.   IUD Intrauterine Device Insertion Procedure Note PRE-OP DIAGNOSIS: desired long-term, reversible contraception  POST-OP DIAGNOSIS: Same  PROCEDURE: IUD placement Performing Physician: Levert Feinstein, MD Supervising Physician (if applicable):   Checklist: Multiple Partners   [_] Yes     [_] No Dysmenorrhea   [_] Yes     [_] No Copper Allergy   [_] Yes     [_] No PID/STD    [_] Yes     [_] No Ectopic Pregnancy   [_] Yes     [_] No Breastfeeding    [_] Yes     [_] No  IUD type: Mirena  PROCEDURE:  Timeout procedure was performed to ensure right patient and right site.   A bimanual exam was performed to determine the position of the uterus.  The speculum was placed. The vagina and cervix was sterilized in the usual manner and sterile technique was maintained throughout the course of the procedure. A single toothed tenaculum was applied to the anterior lip of the cervix and gentle traction applied to straighten and stabilize it. Unable to proceed with insertion due to cervical stenosis. Unable to insert sound. Patient counseled on inability to place IUD. Referral placed to gynecology for IUD placement.  The patient tolerated the  procedure well.

## 2023-04-16 NOTE — Patient Instructions (Signed)
 It was great to see you today! Thank you for choosing [facility] for your primary care. BARBAR BREDE was seen for IUD insertion.  Unfortunately, we were unable to place your IUD today. We have sent a referral to gynecology to place your IUD. If you do not receive a call from them in 2 weeks, please call our office.   If you haven't already, sign up for My Chart to have easy access to your labs results, and communication with your primary care physician.  You should return to our clinic Return if symptoms worsen or fail to improve.  I recommend that you always bring your medications to each appointment as this makes it easy to ensure you are on the correct medications and helps [place] not miss refills when you need them.  Please arrive 15 minutes before your appointment to ensure smooth check in process.  We appreciate your efforts in making this happen.  Please call the clinic at [phone] if your symptoms worsen or you have any concerns.  Thank you for allowing me to participate in your care, Hal Morales, MD 04/16/2023, 11:36 AM PGY-1, [facility]

## 2023-05-11 ENCOUNTER — Ambulatory Visit: Admitting: Student

## 2023-05-11 ENCOUNTER — Other Ambulatory Visit (HOSPITAL_COMMUNITY)
Admission: RE | Admit: 2023-05-11 | Discharge: 2023-05-11 | Disposition: A | Discharge status: Home / Self Care | Source: Ambulatory Visit | Attending: Family Medicine | Admitting: Family Medicine

## 2023-05-11 VITALS — BP 145/71 | HR 77 | Ht 62.0 in | Wt 170.0 lb

## 2023-05-11 DIAGNOSIS — N760 Acute vaginitis: Secondary | ICD-10-CM | POA: Insufficient documentation

## 2023-05-11 DIAGNOSIS — B3731 Acute candidiasis of vulva and vagina: Secondary | ICD-10-CM | POA: Insufficient documentation

## 2023-05-11 DIAGNOSIS — Z113 Encounter for screening for infections with a predominantly sexual mode of transmission: Secondary | ICD-10-CM | POA: Diagnosis present

## 2023-05-11 DIAGNOSIS — B9689 Other specified bacterial agents as the cause of diseases classified elsewhere: Secondary | ICD-10-CM | POA: Diagnosis not present

## 2023-05-11 LAB — POCT URINE PREGNANCY: Preg Test, Ur: NEGATIVE

## 2023-05-11 NOTE — Progress Notes (Signed)
  SUBJECTIVE:   CHIEF COMPLAINT / HPI:   STI testing  She is undergoing routine STI testing, including tests for gonorrhea, chlamydia, HIV, syphilis, bacterial vaginosis (BV), and yeast infection as part of her regular health maintenance.  She has experienced a new onset of vaginal discharge for the past three days. There is no associated burning, itching, or redness. She mentions a recent change in her sexual relationship, stating she was cheated on and her partner claimed he was positive for trichomonas. She is not using protection during sexual intercourse and is not currently on birth control.  She recalls a previous visit where a referral was supposed to be made due to her uterus being 'too close,' which prevented access through the cervix, suggesting possible cervical stenosis.  Patient notes I may leave a VM with positive results on her phone if need.   PERTINENT  PMH / PSH:    OBJECTIVE:  There were no vitals taken for this visit. Physical Exam Exam conducted with a chaperone present.  Genitourinary:    Pubic Area: No rash.      Labia:        Right: No rash, tenderness, lesion or injury.        Left: No rash, tenderness, lesion or injury.      Vagina: No signs of injury and foreign body. Vaginal discharge present. No erythema, tenderness, bleeding, lesions or prolapsed vaginal walls.      ASSESSMENT/PLAN:   Assessment & Plan Routine screening for STI (sexually transmitted infection) Presented for routine STI screening following partner infidelity. Reports no new sexual partners, but her partner was unfathful and positive for trichimonas. She is not using protection during intercourse, not on birth control, and experiencing vaginal discharge for three days. Consented to testing for gonorrhea, chlamydia, HIV, syphilis, bacterial vaginosis, and yeast infection. - STI testing for gonorrhea, chlamydia, Trich, BV, HIV, and syphilis. - Urine Pregnancy test No follow-ups on  file. Bess Kinds, MD 05/11/2023, 10:59 AM PGY-3, [facility]

## 2023-05-11 NOTE — Assessment & Plan Note (Addendum)
 Presented for routine STI screening following partner infidelity. Reports no new sexual partners, but her partner was unfathful and positive for trichimonas. She is not using protection during intercourse, not on birth control, and experiencing vaginal discharge for three days. Consented to testing for gonorrhea, chlamydia, HIV, syphilis, bacterial vaginosis, and yeast infection. - STI testing for gonorrhea, chlamydia, Trich, BV, HIV, and syphilis. - Urine Pregnancy test

## 2023-05-11 NOTE — Patient Instructions (Addendum)
 It was great to see you! Thank you for allowing me to participate in your care!  I recommend that you always bring your medications to each appointment as this makes it easy to ensure we are on the correct medications and helps [place] not miss when refills are needed.  Our plans for today:  - Routine Sexually Transmitted Infection testing  Testing for Gonorrhea, Chlamydia, BV, Trichomonas, HIV, Syphilis, and Yeast  Pregnancy Test  - IUD placement with Gynecology Call and schedule appointment for IUD insertion  [facility] [address] [place], [place] [address] [phone]  We are checking some labs today, I will call you if they are abnormal will send you a MyChart message or a letter if they are normal.  If you do not hear about your labs in the next 2 weeks please let us know.  Take care and seek immediate care sooner if you develop any concerns.   Dr. Bess Kinds, MD [facility]

## 2023-05-12 ENCOUNTER — Other Ambulatory Visit

## 2023-05-13 ENCOUNTER — Other Ambulatory Visit: Payer: Self-pay | Admitting: Student

## 2023-05-13 LAB — CERVICOVAGINAL ANCILLARY ONLY
Bacterial Vaginitis (gardnerella): POSITIVE — AB
Candida Glabrata: NEGATIVE
Candida Vaginitis: POSITIVE — AB
Chlamydia: NEGATIVE
Comment: NEGATIVE
Comment: NEGATIVE
Comment: NEGATIVE
Comment: NEGATIVE
Comment: NEGATIVE
Comment: NORMAL
Neisseria Gonorrhea: NEGATIVE
Trichomonas: NEGATIVE

## 2023-05-13 MED ORDER — METRONIDAZOLE 500 MG PO TABS: 500.0000 mg | ORAL_TABLET | Freq: Two times a day (BID) | ORAL | 0 refills | Status: AC

## 2023-05-13 MED ORDER — FLUCONAZOLE 150 MG PO TABS: 150.0000 mg | ORAL_TABLET | Freq: Once | ORAL | 0 refills | Status: AC

## 2023-05-13 NOTE — Progress Notes (Signed)
 Pt called and results discussed. Abx and diflucan sent for Tx

## 2023-05-14 LAB — RPR: RPR Ser Ql: NONREACTIVE

## 2023-05-14 LAB — HIV ANTIBODY (ROUTINE TESTING W REFLEX)

## 2023-06-03 ENCOUNTER — Ambulatory Visit: Admitting: Obstetrics and Gynecology

## 2023-06-03 VITALS — BP 116/70 | HR 76 | Ht 63.0 in | Wt 172.0 lb

## 2023-06-03 DIAGNOSIS — Z3009 Encounter for other general counseling and advice on contraception: Secondary | ICD-10-CM

## 2023-06-03 NOTE — Progress Notes (Signed)
 GYNECOLOGY VISIT  Patient name: JOBY HERSHKOWITZ MRN [id number]  Date of birth: Aug 08, 1990 Chief Complaint:   Contraception  History:  SHALLYN CONSTANCIO is a 33 y.o. [phone] being seen today for contraception management.  Discussed the use of AI scribe software for clinical note transcription with the patient, who gave verbal consent to proceed.  History of Present Illness ADESSA PRIMIANO is a 33 year old female who presents for contraception consultation and IUD placement.  She is considering a IUD for contraception. She was hoping to have a Mirena  IUD inserted.  She last had unprotected intercourse four days ago.   Previously, she had the Mirena  IUD but experienced dyspareunia, which she described as occurring 'up in this area.' The pain resolved after the removal of the Mirena . She also noted difficulty in locating the IUD strings, which was a concern for her.  She is currently considering rescheduling for the Mirena  IUD despite her previous experiences, as she is familiar with its effects and duration of use.      Past Medical History:  Diagnosis Date   Anemia    Nexplanon  in place 04/16/2021   Placed 04/16/2021> needs removal by 04/16/2024       Past Surgical History:  Procedure Laterality Date   Wisdom teeth removal (11/07/13) Bilateral     The following portions of the patient's history were reviewed and updated as appropriate: allergies, current medications, past family history, past medical history, past social history, past surgical history and problem list.   Health Maintenance:   Last pap     Component Value Date/Time   DIAGPAP  09/11/2022 1345    - Negative for Intraepithelial Lesions or Malignancy (NILM)   DIAGPAP  09/11/2022 1345    - Benign reactive/reparative changes including parakeratosis   DIAGPAP - Low grade squamous intraepithelial lesion (LSIL) (A) 05/08/2020 1455   HPVHIGH Negative 09/11/2022 1345   ADEQPAP  09/11/2022 1345    Satisfactory  for evaluation; transformation zone component PRESENT.   ADEQPAP  05/08/2020 1455    Satisfactory for evaluation; transformation zone component PRESENT.   ADEQPAP (A) 11/13/2016 0000    Satisfactory for evaluation  endocervical/transformation zone component PRESENT.    Last mammogram: n/a   Review of Systems:  Pertinent items are noted in HPI. Comprehensive review of systems was otherwise negative.   Objective:  Physical Exam BP 116/70   Pulse 76   Ht 5\' 3"  (1.6 m)   Wt 172 lb (78 kg)   LMP 05/12/2023   BMI 30.47 kg/m    Physical Exam Vitals and nursing note reviewed.  Constitutional:      Appearance: Normal appearance.  HENT:     Head: Normocephalic and atraumatic.  Pulmonary:     Effort: Pulmonary effort is normal.  Skin:    General: Skin is warm and dry.  Neurological:     General: No focal deficit present.     Mental Status: She is alert.  Psychiatric:        Mood and Affect: Mood normal.        Behavior: Behavior normal.        Thought Content: Thought content normal.        Judgment: Judgment normal.       Assessment & Plan:   Assessment & Plan Contraceptive counseling She considered an IUD for contraception after recent unprotected intercourse. Discussed copper IUD as emergency contraception given last intercourse within 5 days. She prefers Mirena  despite past dyspareunia and  string location issues. Advised to ensure she is not pregnant before insertion. - Advise return in 3-4 weeks for Mirena  IUD placement. - Instruct to use condoms or abstain from intercourse until IUD placement. - Advise scheduling IUD placement within five days of starting her period if able   Routine preventative health maintenance measures emphasized.  Kiki Pelton, MD Minimally Invasive Gynecologic Surgery Center for [facility], [facility]

## 2023-06-03 NOTE — Progress Notes (Signed)
 Pt is in office for Nexplanon  insertion.  Pt had previous Nexplanon  removed at [facility]  Pt is currently sexually active, last intercourse this past weekend.

## 2023-06-12 ENCOUNTER — Ambulatory Visit (INDEPENDENT_AMBULATORY_CARE_PROVIDER_SITE_OTHER): Admitting: Obstetrics and Gynecology

## 2023-06-12 ENCOUNTER — Encounter: Payer: Self-pay | Admitting: Obstetrics and Gynecology

## 2023-06-12 VITALS — BP 120/70 | HR 63 | Ht 63.0 in | Wt 174.2 lb

## 2023-06-12 DIAGNOSIS — Z30430 Encounter for insertion of intrauterine contraceptive device: Secondary | ICD-10-CM | POA: Diagnosis not present

## 2023-06-12 DIAGNOSIS — Z538 Procedure and treatment not carried out for other reasons: Secondary | ICD-10-CM

## 2023-06-12 DIAGNOSIS — Z3202 Encounter for pregnancy test, result negative: Secondary | ICD-10-CM | POA: Diagnosis not present

## 2023-06-12 LAB — POCT URINE PREGNANCY: Preg Test, Ur: NEGATIVE

## 2023-06-12 NOTE — Progress Notes (Unsigned)
 Pt here for IUD placement of Mirena .  Last sexual encounter was "about a week" ago.   No other questions or concerns at this time.

## 2023-06-13 NOTE — Progress Notes (Unsigned)
   GYNECOLOGY CLINIC PROCEDURE NOTE  Cindy Armstrong is a 33 y.o. [phone] here for Mirena  IUD insertion. No GYN concerns.  Last pap smear was on 09/10/32 and was normal.  IUD Insertion Procedure Note Patient identified, informed consent performed, consent signed.   Discussed risks of irregular bleeding, cramping, infection, malpositioning or misplacement of the IUD outside the uterus which may require further procedure such as laparoscopy. Time out was performed.  Urine pregnancy test negative.  Speculum placed in the vagina.  Cervix visualized.  Cleaned with Betadine x 2.  Grasped anteriorly with a single tooth tenaculum.  Unable to sound, attempted to dilate, unsuccessful IUD insertion   Patient was given instructions to return, discussed use of cervical ripening agent with cytotec prior to IUD insertion.   Susi Eric, FNP

## 2023-06-15 MED ORDER — MISOPROSTOL 100 MCG PO TABS: 200.0000 ug | ORAL_TABLET | Freq: Once | ORAL | 0 refills | Status: DC

## 2023-07-01 ENCOUNTER — Other Ambulatory Visit (HOSPITAL_COMMUNITY)
Admission: RE | Admit: 2023-07-01 | Discharge: 2023-07-01 | Disposition: A | Discharge status: Home / Self Care | Source: Ambulatory Visit | Attending: Obstetrics and Gynecology | Admitting: Obstetrics and Gynecology

## 2023-07-01 ENCOUNTER — Encounter: Payer: Self-pay | Admitting: Obstetrics and Gynecology

## 2023-07-01 ENCOUNTER — Ambulatory Visit: Admitting: Obstetrics and Gynecology

## 2023-07-01 VITALS — BP 121/74 | HR 48 | Ht 63.0 in | Wt 173.0 lb

## 2023-07-01 DIAGNOSIS — Z113 Encounter for screening for infections with a predominantly sexual mode of transmission: Secondary | ICD-10-CM | POA: Insufficient documentation

## 2023-07-01 DIAGNOSIS — Z202 Contact with and (suspected) exposure to infections with a predominantly sexual mode of transmission: Secondary | ICD-10-CM | POA: Insufficient documentation

## 2023-07-01 DIAGNOSIS — Z3009 Encounter for other general counseling and advice on contraception: Secondary | ICD-10-CM

## 2023-07-01 DIAGNOSIS — Z30430 Encounter for insertion of intrauterine contraceptive device: Secondary | ICD-10-CM

## 2023-07-01 MED ORDER — MISOPROSTOL 100 MCG PO TABS: 200.0000 ug | ORAL_TABLET | Freq: Once | ORAL | 0 refills | Status: AC

## 2023-07-01 MED ORDER — METRONIDAZOLE 500 MG PO TABS: 500.0000 mg | ORAL_TABLET | Freq: Two times a day (BID) | ORAL | 0 refills | Status: AC

## 2023-07-01 NOTE — Progress Notes (Deleted)
   GYNECOLOGY CLINIC PROCEDURE NOTE  Cindy Armstrong is a 33 y.o. [phone] here for  IUD insertion. No GYN concerns.  Last pap smear was on 09/11/22 and was normal.  IUD Insertion Procedure Note Patient identified, informed consent performed, consent signed.   Discussed risks of irregular bleeding, cramping, infection, malpositioning or misplacement of the IUD outside the uterus which may require further procedure such as laparoscopy. Time out was performed.  Urine pregnancy test negative.  Speculum placed in the vagina.  Cervix visualized.  Cleaned with Betadine x 2.  Grasped anteriorly with a single tooth tenaculum.  Uterus sounded to *** cm.  *** IUD placed per manufacturer's recommendations.  Strings trimmed to 3 cm. Tenaculum was removed, good hemostasis noted.  Patient tolerated procedure well.   Patient was given post-procedure instructions.  She was advised to have backup contraception for one week.  Patient was also asked to check IUD strings periodically and follow up in 4 weeks for IUD check.

## 2023-07-01 NOTE — Progress Notes (Signed)
   GYNECOLOGY PROGRESS NOTE  History:  33 y.o. G2P0202 presents to [facility] IUD insertion and STD testing.   Reports had unprotected sex 10 days ago, she just found out that her partner tested positive for trichomonas   The following portions of the patient's history were reviewed and updated as appropriate: allergies, current medications, past family history, past medical history, past social history, past surgical history and problem list. Last pap smear on 09/11/22 was normal  Health Maintenance Due  Topic Date Due   COVID-19 Vaccine (3 - Pfizer risk series) 12/08/2019     Review of Systems:  Pertinent items are noted in HPI.   Objective:  Physical Exam Blood pressure 121/74, pulse (!) 48, height 5\' 3"  (1.6 m), weight 173 lb (78.5 kg), last menstrual period 06/06/2023. VS reviewed, nursing note reviewed,  Constitutional: well developed, well nourished, no distress HEENT: normocephalic Pulm/chest wall: normal effort Neuro: alert and oriented  Psych: affect normal Pelvic exam: deferred  Assessment & Plan:  1. Screen for STD (sexually transmitted disease) (Primary)  - Cervicovaginal ancillary only( [place]) - metroNIDAZOLE  (FLAGYL ) 500 MG tablet; Take 1 tablet (500 mg total) by mouth 2 (two) times daily for 7 days.  Dispense: 14 tablet; Refill: 0  2. Trichomonas contact Treat today given contact - Cervicovaginal ancillary only( [place]) - metroNIDAZOLE  (FLAGYL ) 500 MG tablet; Take 1 tablet (500 mg total) by mouth 2 (two) times daily for 7 days.  Dispense: 14 tablet; Refill: 0  3. Encounter for IUD insertion Unable to perform today given unprotected sex > 5 days ago and less than 14 days. Discussed following up next week for IUD insertion, abstain until then. Rx sent for cytotec  - misoprostol  (CYTOTEC ) 100 MCG tablet; Take 2 tablets (200 mcg total) by mouth once for 1 dose. Take 2 tablets by mouth 1-2 hours prior to appointment  Dispense: 2 tablet; Refill: 0   Return add  to Kathy Wares schedule 5/28 for IUD insertion.   Future Appointments  Date Time Provider Department Center  07/08/2023  9:55 AM Zelma Hidden, FNP [facility] None    Susi Eric, FNP

## 2023-07-01 NOTE — Progress Notes (Signed)
 Pt presents for IUD insertion. Last unprotected sex 1 weeks ago.  Requesting STD testing. States boyfroend has Therapist, occupational

## 2023-07-03 LAB — CERVICOVAGINAL ANCILLARY ONLY
Bacterial Vaginitis (gardnerella): POSITIVE — AB
Candida Glabrata: NEGATIVE
Candida Vaginitis: NEGATIVE
Chlamydia: NEGATIVE
Comment: NEGATIVE
Comment: NEGATIVE
Comment: NEGATIVE
Comment: NEGATIVE
Comment: NEGATIVE
Comment: NORMAL
Neisseria Gonorrhea: NEGATIVE
Trichomonas: NEGATIVE

## 2023-07-06 ENCOUNTER — Ambulatory Visit: Payer: Self-pay | Admitting: Obstetrics and Gynecology

## 2023-07-08 ENCOUNTER — Ambulatory Visit: Admitting: Obstetrics and Gynecology

## 2023-07-08 VITALS — BP 136/81 | HR 68 | Ht 60.0 in | Wt 171.1 lb

## 2023-07-08 DIAGNOSIS — Z30017 Encounter for initial prescription of implantable subdermal contraceptive: Secondary | ICD-10-CM | POA: Diagnosis not present

## 2023-07-08 DIAGNOSIS — N882 Stricture and stenosis of cervix uteri: Secondary | ICD-10-CM | POA: Diagnosis not present

## 2023-07-08 DIAGNOSIS — Z3202 Encounter for pregnancy test, result negative: Secondary | ICD-10-CM | POA: Diagnosis not present

## 2023-07-08 DIAGNOSIS — Z30430 Encounter for insertion of intrauterine contraceptive device: Secondary | ICD-10-CM | POA: Diagnosis not present

## 2023-07-08 DIAGNOSIS — Z538 Procedure and treatment not carried out for other reasons: Secondary | ICD-10-CM

## 2023-07-08 LAB — POCT URINE PREGNANCY: Preg Test, Ur: NEGATIVE

## 2023-07-08 MED ORDER — ETONOGESTREL 68 MG ~~LOC~~ IMPL
68.0000 mg | DRUG_IMPLANT | Freq: Once | SUBCUTANEOUS | Status: AC
  Administered 2023-07-08: 68 mg via SUBCUTANEOUS

## 2023-07-08 MED ORDER — LEVONORGESTREL 20 MCG/DAY IU IUD
1.0000 | INTRAUTERINE_SYSTEM | Freq: Once | INTRAUTERINE | Status: AC
  Administered 2023-07-08: 1 via INTRAUTERINE

## 2023-07-08 NOTE — Patient Instructions (Addendum)
 At Home Vaginal BV Prevention: If engaging in intercourse without a barrier method, consider using semen sponges afterward. You can buy them at awkwardessentials.com Begin taking a daily probiotic  Use a gentle antibacterial soap on your vulva (never in the vagina), such as Alaffia's Liquid Black Soap with Tea Tree. Avoid scented or harsh soaps. Use a "tea tree tampon" once a month: Melt 1/4c virgin coconut oil, add 1-2 drops of quality tea tree oil.  Soak a tampon in the mixture  Insert overnight the day after your period has ended. 5.   Place a 30mg  boric acid tablet vaginally every night for 21 nights, then weekly for        9 weeks.

## 2023-07-08 NOTE — Progress Notes (Signed)
   GYNECOLOGY CLINIC PROCEDURE NOTE  MCKINLEY ADELSTEIN is a 33 y.o. [phone] here for Mirena  IUD insertion. No GYN concerns.  Last pap smear was on 06/11/22 and was normal. Hx of LEEP 2023  IUD Insertion Procedure Note Patient identified, informed consent performed, consent signed.   Discussed risks of irregular bleeding, cramping, infection, malpositioning or misplacement of the IUD outside the uterus which may require further procedure such as laparoscopy. Time out was performed.  Urine pregnancy test negative.  Speculum placed in the vagina.  Cervix visualized.  Cleaned with Betadine x 2.  Grasped anteriorly with a single tooth tenaculum. Unable to sound, unable to dilate, attempted trial with emb, d/t unsuccessful attempts even with cytotec , procedure stopped. Discussed other option    Nexplanon  Insertion Procedure Patient identified, informed consent performed, consent signed.   Patient does understand that irregular bleeding is a very common side effect of this medication. She was advised to have backup contraception for one week after placement. Pregnancy test in clinic today was negative.  Appropriate time out taken.    Patient's left arm was prepped and draped in the usual sterile fashion. The ruler used to measure and mark insertion area.  Patient was prepped with alcohol swab and then injected with 3 ml of 1% lidocaine .  She was prepped with betadine, Nexplanon  removed from packaging,  Device confirmed in needle, then inserted full length of needle and withdrawn per handbook instructions. Nexplanon  was able to palpated in the patient's arm; patient palpated the insert herself. There was minimal blood loss.  Patient insertion site covered with guaze and a pressure bandage to reduce any bruising.    The patient tolerated the procedure well and was given post procedure instructions.   Susi Eric, FNP Center for [employer], [facility]

## 2023-07-08 NOTE — Progress Notes (Signed)
 Pt presents for iud insertion. Pt has no questions or concerns at this time.

## 2023-07-08 NOTE — Addendum Note (Signed)
 Addended by: Lynford Sarin on: 07/08/2023 01:50 PM   Modules accepted: Orders

## 2023-08-04 ENCOUNTER — Telehealth: Payer: Self-pay

## 2023-08-04 NOTE — Telephone Encounter (Signed)
 Call returned to [facility]. Spoke with Pranav in regards to ref# [id number]. He is inquiring if patient received IUD or nexplanon  at her 07/08/23 visit. RN advised that the nexplanon  was received. Unable to confirm whether IUD was sent back to manufacturer or discarded, as it's not mentioned in note.

## 2023-09-25 ENCOUNTER — Ambulatory Visit (INDEPENDENT_AMBULATORY_CARE_PROVIDER_SITE_OTHER): Admitting: Family Medicine

## 2023-09-25 ENCOUNTER — Other Ambulatory Visit (HOSPITAL_COMMUNITY)
Admission: RE | Admit: 2023-09-25 | Discharge: 2023-09-25 | Disposition: A | Discharge status: Home / Self Care | Source: Ambulatory Visit | Attending: Family Medicine | Admitting: Family Medicine

## 2023-09-25 VITALS — BP 139/77 | HR 58 | Ht 63.0 in | Wt 169.2 lb

## 2023-09-25 DIAGNOSIS — N898 Other specified noninflammatory disorders of vagina: Secondary | ICD-10-CM | POA: Insufficient documentation

## 2023-09-25 DIAGNOSIS — R350 Frequency of micturition: Secondary | ICD-10-CM | POA: Diagnosis not present

## 2023-09-25 LAB — POCT WET PREP (WET MOUNT)
Clue Cells Wet Prep Whiff POC: POSITIVE
Trichomonas Wet Prep HPF POC: ABSENT

## 2023-09-25 LAB — POCT URINE DIPSTICK
Bilirubin, UA: NEGATIVE
Blood, UA: NEGATIVE
Glucose, UA: NEGATIVE mg/dL
Ketones, POC UA: NEGATIVE mg/dL
Leukocytes, UA: NEGATIVE
Nitrite, UA: NEGATIVE
POC PROTEIN,UA: NEGATIVE
Spec Grav, UA: 1.025 (ref 1.010–1.025)
Urobilinogen, UA: 0.2 U/dL
pH, UA: 7 (ref 5.0–8.0)

## 2023-09-25 NOTE — Progress Notes (Signed)
    SUBJECTIVE:   CHIEF COMPLAINT / HPI:   Vaginal irritation Patient notes frequent urination, pelvic odor, increased white vaginal discharge for the last week and half. Has not been on antibiotics recently.  Patient had positive BV 2 months ago and was treated with antibiotics.  Also had BV 4 months ago and 7 months ago per her chart.  Patient last had sex one week ago and a couple days before that. She endorses voiding after sexual intercourse every time. She avoids using cleansers or soaps internally.  No abdominal pain or fevers.  No dysuria.  She notes that the other occurrences of BV were also after sexual intercourse. She has decreased sexual activity due to this.    PERTINENT  PMH / PSH: none pertinent   OBJECTIVE:   BP 139/77   Pulse (!) 58   Ht 5' 3 (1.6 m)   Wt 169 lb 3.2 oz (76.7 kg)   LMP 09/25/2023   SpO2 96%   BMI 29.97 kg/m   General: well appearing, in no acute distress CV: well perfused, no BLE edema  Resp: Normal work of breathing on room air Abd: Soft, non tender, non distended, no suprapubic tenderness  GU (chaperoned by CMA): no vulvar lesions, thick white discharge in the vagina, no CMT, scant blood visible in the cervical os.    ASSESSMENT/PLAN:   Assessment & Plan Frequency of urination Lack of dysuria makes UTI less likely. Could be related to patient drinking an herbal tea that patient has been drinking to help with constipation recently.  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  - wet prep, cervicovaginal GC/CT, trich - Treat current BV with metronidazole  500 mg BID for 7 days, then start metrogel  twice weekly  - Follow up in one month       Areta Saliva, MD [facility]

## 2023-09-25 NOTE — Patient Instructions (Signed)
 It was wonderful to see you today.  Please bring ALL of your medications with you to every visit.   Today we talked about:  We tested your urine as well as for BV and STIs. We did not do the blood test for HIV and Syphillis today as our lab is closed but I can order them for you to get tested at a nearby [facility] if you would like. I will let you know your results on mychart and will send medications to your pharmacy if needed. You may need an antibiotic given just after sexual intercourse to prevent recurrent BV in the future.   Please follow up in 3 months   Thank you for choosing [facility].   Please call [phone] with any questions about today's appointment.  Please be sure to schedule follow up at the front desk before you leave today.   Areta Saliva, MD  Family Medicine

## 2023-09-26 ENCOUNTER — Ambulatory Visit: Payer: Self-pay | Admitting: Family Medicine

## 2023-09-26 DIAGNOSIS — B9689 Other specified bacterial agents as the cause of diseases classified elsewhere: Secondary | ICD-10-CM

## 2023-09-26 MED ORDER — METRONIDAZOLE 0.75 % VA GEL: 1.0000 | VAGINAL | 1 refills | Status: AC

## 2023-09-26 MED ORDER — METRONIDAZOLE 500 MG PO TABS: 500.0000 mg | ORAL_TABLET | Freq: Two times a day (BID) | ORAL | 0 refills | Status: AC

## 2023-09-26 NOTE — Progress Notes (Signed)
 Urine normal. Wet prep concerning for BV. Will send antibiotics and metrogel  to start for prophylaxis thereafter for recurrent BV

## 2023-09-28 LAB — CERVICOVAGINAL ANCILLARY ONLY
Chlamydia: NEGATIVE
Comment: NEGATIVE
Comment: NEGATIVE
Comment: NORMAL
Neisseria Gonorrhea: NEGATIVE
Trichomonas: NEGATIVE

## 2023-09-30 ENCOUNTER — Ambulatory Visit (INDEPENDENT_AMBULATORY_CARE_PROVIDER_SITE_OTHER): Admitting: Student

## 2023-09-30 VITALS — BP 123/73 | HR 72 | Ht 63.0 in | Wt 169.0 lb

## 2023-09-30 DIAGNOSIS — T22231A Burn of second degree of right upper arm, initial encounter: Secondary | ICD-10-CM
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
## 2024-01-05 ENCOUNTER — Ambulatory Visit: Admitting: Student

## 2024-01-05 ENCOUNTER — Encounter: Payer: Self-pay | Admitting: Student

## 2024-01-05 VITALS — BP 120/72 | HR 79 | Ht 63.0 in | Wt 172.0 lb

## 2024-01-05 DIAGNOSIS — R1084 Generalized abdominal pain: Secondary | ICD-10-CM | POA: Diagnosis not present

## 2024-01-05 LAB — POCT URINE PREGNANCY: Preg Test, Ur: NEGATIVE

## 2024-01-05 MED ORDER — FAMOTIDINE 20 MG PO TABS: 20.0000 mg | ORAL_TABLET | Freq: Two times a day (BID) | ORAL | 1 refills | Status: AC

## 2024-01-05 NOTE — Progress Notes (Signed)
    SUBJECTIVE:   CHIEF COMPLAINT / HPI:   History of Present Illness Cindy Armstrong is a 33 year old female who presents with gastrointestinal symptoms and weight changes.  She developed gastrointestinal symptoms last week, starting with vague stomach discomfort that progressed to nausea and then vomiting Saturday night. By Sunday and early Monday she had chest and upper abdominal soreness that worsens with deep breaths, yawning, or coughing, along with watery diarrhea this morning and no blood in stool. She also has a persistent cough and chest pressure that occur with the stomach pain.   She denies recent travel and new medications. She uses heartburn medication, which helps her symptoms. She works at [employer] and has a son who recently developed a sore throat. She is sexually active.  PERTINENT  PMH / PSH: Reviewed   OBJECTIVE:   BP 120/72   Pulse 79   Ht 5' 3 (1.6 m)   Wt 172 lb (78 kg)   SpO2 100%   BMI 30.47 kg/m    Physical Exam General: Alert, well appearing, NAD Cardiovascular: RRR, No Murmurs, Normal S2/S2 Respiratory: CTAB, No wheezing or Rales Abdomen: No distension or tenderness Extremities: No edema on extremities   Skin: Warm and dry  ASSESSMENT/PLAN:   Acute viral gastroenteritis  Symptoms consistent with viral gastroenteritis. Negative pregnancy test.  - Advised Metamucil or Imodium for bowel movement control. - Encouraged hydration. - Recommend warm water, Honey and Lemon - Prescribed Zofran  for nausea. - Recommended Tylenol  or ibuprofen  for pain every 8 hours as needed.  Heartburn Persistent heartburn controlled with current medication. Refill requested. - Refilled heartburn medication.  Norleen April, MD [facility]

## 2024-01-05 NOTE — Patient Instructions (Signed)
 Pleasure to meet you today.  Suspect your symptoms are most likely due to ongoing viral upper respiratory illness and gastroenteritis.  I recommend making sure you stay hydrated with enough water.  Also you can do Tylenol  or ibuprofen  for any aches and pain with the cough.  Enourage use of warm water, honey and lemon to help with the cough.  Can do over-the-counter Imodium or Metamucil if the diarrhea continues.
